# Patient Record
Sex: Male | Born: 1935
Health system: Southern US, Community
[De-identification: ages and names within clinical notes are randomized; demographics above are authoritative.]

## PROBLEM LIST (undated history)

## (undated) DIAGNOSIS — N183 Chronic kidney disease, stage 3 unspecified: Secondary | ICD-10-CM

## (undated) DIAGNOSIS — I469 Cardiac arrest, cause unspecified: Secondary | ICD-10-CM

## (undated) DIAGNOSIS — Z95810 Presence of automatic (implantable) cardiac defibrillator: Secondary | ICD-10-CM

## (undated) DIAGNOSIS — I255 Ischemic cardiomyopathy: Secondary | ICD-10-CM

## (undated) DIAGNOSIS — Z9989 Dependence on other enabling machines and devices: Secondary | ICD-10-CM

## (undated) DIAGNOSIS — N138 Other obstructive and reflux uropathy: Secondary | ICD-10-CM

## (undated) DIAGNOSIS — I219 Acute myocardial infarction, unspecified: Secondary | ICD-10-CM

## (undated) DIAGNOSIS — S838X9A Sprain of other specified parts of unspecified knee, initial encounter: Secondary | ICD-10-CM

## (undated) DIAGNOSIS — E669 Obesity, unspecified: Secondary | ICD-10-CM

## (undated) DIAGNOSIS — G4733 Obstructive sleep apnea (adult) (pediatric): Secondary | ICD-10-CM

## (undated) DIAGNOSIS — N401 Enlarged prostate with lower urinary tract symptoms: Secondary | ICD-10-CM

## (undated) DIAGNOSIS — M75100 Unspecified rotator cuff tear or rupture of unspecified shoulder, not specified as traumatic: Secondary | ICD-10-CM

## (undated) DIAGNOSIS — E7800 Pure hypercholesterolemia, unspecified: Secondary | ICD-10-CM

## (undated) DIAGNOSIS — Z860101 Personal history of adenomatous and serrated colon polyps: Secondary | ICD-10-CM

## (undated) DIAGNOSIS — N529 Male erectile dysfunction, unspecified: Secondary | ICD-10-CM

## (undated) DIAGNOSIS — I2510 Atherosclerotic heart disease of native coronary artery without angina pectoris: Secondary | ICD-10-CM

## (undated) DIAGNOSIS — D649 Anemia, unspecified: Secondary | ICD-10-CM

## (undated) DIAGNOSIS — E291 Testicular hypofunction: Secondary | ICD-10-CM

## (undated) DIAGNOSIS — M1990 Unspecified osteoarthritis, unspecified site: Secondary | ICD-10-CM

## (undated) DIAGNOSIS — I519 Heart disease, unspecified: Secondary | ICD-10-CM

## (undated) DIAGNOSIS — Z86010 Personal history of colonic polyps: Secondary | ICD-10-CM

## (undated) DIAGNOSIS — I872 Venous insufficiency (chronic) (peripheral): Secondary | ICD-10-CM

## (undated) DIAGNOSIS — M949 Disorder of cartilage, unspecified: Secondary | ICD-10-CM

## (undated) DIAGNOSIS — E119 Type 2 diabetes mellitus without complications: Secondary | ICD-10-CM

## (undated) DIAGNOSIS — M899 Disorder of bone, unspecified: Secondary | ICD-10-CM

## (undated) DIAGNOSIS — N390 Urinary tract infection, site not specified: Secondary | ICD-10-CM

## (undated) DIAGNOSIS — I10 Essential (primary) hypertension: Secondary | ICD-10-CM

## (undated) DIAGNOSIS — K219 Gastro-esophageal reflux disease without esophagitis: Secondary | ICD-10-CM

## (undated) HISTORY — PX: OTHER SURGICAL HISTORY: SHX169

## (undated) HISTORY — DX: Atherosclerotic heart disease of native coronary artery without angina pectoris: I25.10

## (undated) HISTORY — DX: Obesity, unspecified: E66.9

## (undated) HISTORY — DX: Heart disease, unspecified: I51.9

## (undated) HISTORY — DX: Venous insufficiency (chronic) (peripheral): I87.2

## (undated) HISTORY — DX: Personal history of adenomatous and serrated colon polyps: Z86.0101

## (undated) HISTORY — DX: Ischemic cardiomyopathy: I25.5

## (undated) HISTORY — DX: Pure hypercholesterolemia, unspecified: E78.00

## (undated) HISTORY — DX: Essential (primary) hypertension: I10

## (undated) HISTORY — DX: Male erectile dysfunction, unspecified: N52.9

## (undated) HISTORY — DX: Testicular hypofunction: E29.1

## (undated) HISTORY — PX: POLYPECTOMY: SHX149

## (undated) HISTORY — PX: CARDIAC DEFIBRILLATOR PLACEMENT: SHX171

## (undated) HISTORY — DX: Dependence on other enabling machines and devices: Z99.89

## (undated) HISTORY — DX: Chronic kidney disease, stage 3 unspecified: N18.30

## (undated) HISTORY — DX: Other obstructive and reflux uropathy: N40.1

## (undated) HISTORY — DX: Disorder of bone, unspecified: M89.9

## (undated) HISTORY — DX: Unspecified rotator cuff tear or rupture of unspecified shoulder, not specified as traumatic: M75.100

## (undated) HISTORY — DX: Unspecified osteoarthritis, unspecified site: M19.90

## (undated) HISTORY — DX: Chronic kidney disease, stage 3 (moderate): N18.3

## (undated) HISTORY — DX: Cardiac arrest, cause unspecified: I46.9

## (undated) HISTORY — DX: Personal history of colonic polyps: Z86.010

## (undated) HISTORY — DX: Sprain of other specified parts of unspecified knee, initial encounter: S83.8X9A

## (undated) HISTORY — DX: Gastro-esophageal reflux disease without esophagitis: K21.9

## (undated) HISTORY — DX: Type 2 diabetes mellitus without complications: E11.9

## (undated) HISTORY — DX: Disorder of bone, unspecified: M94.9

## (undated) HISTORY — DX: Anemia, unspecified: D64.9

## (undated) HISTORY — DX: Benign prostatic hyperplasia with lower urinary tract symptoms: N13.8

## (undated) HISTORY — DX: Obstructive sleep apnea (adult) (pediatric): G47.33

---

## 1994-02-17 HISTORY — PX: CORONARY ARTERY BYPASS GRAFT: SHX141

## 2001-02-17 HISTORY — PX: COLONOSCOPY W/ POLYPECTOMY: SHX1380

## 2004-03-06 ENCOUNTER — Ambulatory Visit: Payer: Self-pay | Admitting: Family Medicine

## 2004-03-08 ENCOUNTER — Ambulatory Visit: Payer: Self-pay | Admitting: Family Medicine

## 2004-04-23 ENCOUNTER — Ambulatory Visit: Payer: Self-pay | Admitting: Family Medicine

## 2006-01-12 ENCOUNTER — Ambulatory Visit: Payer: Self-pay | Admitting: Family Medicine

## 2006-01-28 ENCOUNTER — Ambulatory Visit: Payer: Self-pay | Admitting: Family Medicine

## 2006-01-28 LAB — CONVERTED CEMR LAB
ALT: 27 units/L (ref 0–40)
AST: 26 units/L (ref 0–37)
Chol/HDL Ratio, serum: 4.5
Cholesterol: 164 mg/dL (ref 0–200)
HDL: 36.2 mg/dL — ABNORMAL LOW (ref >39.0)
LDL Cholesterol: 112 mg/dL — ABNORMAL HIGH (ref 0–99)
PSA: 0.33 ng/mL (ref 0.10–4.00)
TSH: 3.3 microintl units/mL (ref 0.35–5.50)
Triglyceride fasting, serum: 81 mg/dL (ref 0–149)
VLDL: 16 mg/dL (ref 0–40)

## 2006-02-17 DIAGNOSIS — I469 Cardiac arrest, cause unspecified: Secondary | ICD-10-CM

## 2006-02-17 HISTORY — DX: Cardiac arrest, cause unspecified: I46.9

## 2006-05-05 ENCOUNTER — Ambulatory Visit: Payer: Self-pay | Admitting: Family Medicine

## 2006-05-05 LAB — CONVERTED CEMR LAB
ALT: 29 units/L (ref 0–40)
AST: 25 units/L (ref 0–37)
Cholesterol: 124 mg/dL (ref 0–200)
HDL: 33.7 mg/dL — ABNORMAL LOW (ref >39.0)
Hgb A1c MFr Bld: 6.5 % — ABNORMAL HIGH (ref 4.6–6.0)
LDL Cholesterol: 74 mg/dL (ref 0–99)
Total CHOL/HDL Ratio: 3.7
Triglycerides: 81 mg/dL (ref 0–149)
VLDL: 16 mg/dL (ref 0–40)

## 2006-07-19 HISTORY — PX: PACEMAKER PLACEMENT: SHX43

## 2006-07-24 ENCOUNTER — Encounter: Payer: Self-pay | Admitting: Cardiovascular Disease

## 2006-07-24 ENCOUNTER — Ambulatory Visit: Payer: Self-pay | Admitting: Cardiology

## 2006-07-24 ENCOUNTER — Inpatient Hospital Stay (HOSPITAL_COMMUNITY): Admission: EM | Admit: 2006-07-24 | Discharge: 2006-08-14 | Payer: Self-pay | Admitting: Emergency Medicine

## 2006-07-24 ENCOUNTER — Ambulatory Visit: Payer: Self-pay | Admitting: Pulmonary Disease

## 2006-07-24 ENCOUNTER — Ambulatory Visit: Payer: Self-pay | Admitting: Cardiovascular Disease

## 2006-08-16 ENCOUNTER — Ambulatory Visit: Payer: Self-pay | Admitting: Cardiovascular Disease

## 2006-08-24 ENCOUNTER — Ambulatory Visit: Payer: Self-pay | Admitting: Cardiovascular Disease

## 2006-08-31 ENCOUNTER — Ambulatory Visit: Payer: Self-pay

## 2006-09-07 ENCOUNTER — Ambulatory Visit: Payer: Self-pay | Admitting: Cardiovascular Disease

## 2006-09-07 LAB — CONVERTED CEMR LAB
BUN: 18 mg/dL (ref 6–23)
CO2: 28 meq/L (ref 19–32)
Calcium: 9 mg/dL (ref 8.4–10.5)
Chloride: 109 meq/L (ref 96–112)
Creatinine, Ser: 1.6 mg/dL — ABNORMAL HIGH (ref 0.4–1.5)
GFR calc Af Amer: 56 mL/min
GFR calc non Af Amer: 46 mL/min
Glucose, Bld: 144 mg/dL — ABNORMAL HIGH (ref 70–99)
Potassium: 5.1 meq/L (ref 3.5–5.1)
Sodium: 143 meq/L (ref 135–145)

## 2006-09-17 ENCOUNTER — Ambulatory Visit: Payer: Self-pay

## 2006-09-18 ENCOUNTER — Ambulatory Visit: Payer: Self-pay | Admitting: Pulmonary Disease

## 2006-10-01 ENCOUNTER — Encounter (HOSPITAL_COMMUNITY): Admission: RE | Admit: 2006-10-01 | Discharge: 2006-12-30 | Payer: Self-pay | Admitting: Cardiovascular Disease

## 2006-10-07 ENCOUNTER — Telehealth (INDEPENDENT_AMBULATORY_CARE_PROVIDER_SITE_OTHER): Payer: Self-pay | Admitting: *Deleted

## 2006-10-12 ENCOUNTER — Ambulatory Visit: Payer: Self-pay | Admitting: Pulmonary Disease

## 2006-10-12 ENCOUNTER — Ambulatory Visit (HOSPITAL_BASED_OUTPATIENT_CLINIC_OR_DEPARTMENT_OTHER): Admission: RE | Admit: 2006-10-12 | Discharge: 2006-10-12 | Payer: Self-pay | Admitting: Pulmonary Disease

## 2006-10-16 ENCOUNTER — Ambulatory Visit: Payer: Self-pay | Admitting: Cardiovascular Disease

## 2006-10-16 LAB — CONVERTED CEMR LAB
ALT: 16 units/L (ref 0–53)
AST: 16 units/L (ref 0–37)
Albumin: 3.5 g/dL (ref 3.5–5.2)
Alkaline Phosphatase: 34 units/L — ABNORMAL LOW (ref 39–117)
BUN: 19 mg/dL (ref 6–23)
Bilirubin, Direct: 0.2 mg/dL (ref 0.0–0.3)
CO2: 25 meq/L (ref 19–32)
Calcium: 9.4 mg/dL (ref 8.4–10.5)
Chloride: 107 meq/L (ref 96–112)
Cholesterol: 99 mg/dL (ref 0–200)
Creatinine, Ser: 1.4 mg/dL (ref 0.4–1.5)
GFR calc Af Amer: 65 mL/min
GFR calc non Af Amer: 54 mL/min
Glucose, Bld: 102 mg/dL — ABNORMAL HIGH (ref 70–99)
HDL: 25.8 mg/dL — ABNORMAL LOW (ref >39.0)
LDL Cholesterol: 56 mg/dL (ref 0–99)
Potassium: 4.4 meq/L (ref 3.5–5.1)
Sodium: 139 meq/L (ref 135–145)
Total Bilirubin: 0.8 mg/dL (ref 0.3–1.2)
Total CHOL/HDL Ratio: 3.8
Total Protein: 6.8 g/dL (ref 6.0–8.3)
Triglycerides: 86 mg/dL (ref 0–149)
VLDL: 17 mg/dL (ref 0–40)

## 2006-10-21 ENCOUNTER — Ambulatory Visit: Payer: Self-pay | Admitting: Cardiovascular Disease

## 2006-10-22 ENCOUNTER — Encounter: Admission: RE | Admit: 2006-10-22 | Discharge: 2006-10-22 | Payer: Self-pay | Admitting: Neurosurgery

## 2006-10-23 ENCOUNTER — Encounter: Payer: Self-pay | Admitting: Family Medicine

## 2006-10-27 ENCOUNTER — Ambulatory Visit: Payer: Self-pay | Admitting: Pulmonary Disease

## 2006-11-09 ENCOUNTER — Ambulatory Visit (HOSPITAL_BASED_OUTPATIENT_CLINIC_OR_DEPARTMENT_OTHER): Admission: RE | Admit: 2006-11-09 | Discharge: 2006-11-09 | Payer: Self-pay | Admitting: Pulmonary Disease

## 2006-11-09 ENCOUNTER — Ambulatory Visit: Payer: Self-pay | Admitting: Pulmonary Disease

## 2006-11-16 ENCOUNTER — Encounter: Payer: Self-pay | Admitting: Family Medicine

## 2006-12-02 ENCOUNTER — Ambulatory Visit: Payer: Self-pay | Admitting: Internal Medicine

## 2006-12-04 ENCOUNTER — Ambulatory Visit: Payer: Self-pay | Admitting: Family Medicine

## 2006-12-31 ENCOUNTER — Encounter (HOSPITAL_COMMUNITY): Admission: RE | Admit: 2006-12-31 | Discharge: 2007-03-01 | Payer: Self-pay | Admitting: Cardiovascular Disease

## 2007-01-12 ENCOUNTER — Ambulatory Visit: Payer: Self-pay | Admitting: Pulmonary Disease

## 2007-01-21 ENCOUNTER — Ambulatory Visit: Payer: Self-pay | Admitting: Cardiovascular Disease

## 2007-01-21 ENCOUNTER — Encounter: Payer: Self-pay | Admitting: Family Medicine

## 2007-03-11 ENCOUNTER — Ambulatory Visit: Payer: Self-pay

## 2007-06-17 ENCOUNTER — Ambulatory Visit: Payer: Self-pay | Admitting: Internal Medicine

## 2007-10-29 ENCOUNTER — Ambulatory Visit: Payer: Self-pay | Admitting: Internal Medicine

## 2007-12-07 ENCOUNTER — Ambulatory Visit: Payer: Self-pay | Admitting: Family Medicine

## 2007-12-07 DIAGNOSIS — E785 Hyperlipidemia, unspecified: Secondary | ICD-10-CM | POA: Insufficient documentation

## 2007-12-07 DIAGNOSIS — E039 Hypothyroidism, unspecified: Secondary | ICD-10-CM | POA: Insufficient documentation

## 2007-12-07 DIAGNOSIS — M949 Disorder of cartilage, unspecified: Secondary | ICD-10-CM

## 2007-12-07 DIAGNOSIS — M899 Disorder of bone, unspecified: Secondary | ICD-10-CM | POA: Insufficient documentation

## 2007-12-07 LAB — CONVERTED CEMR LAB
Bilirubin Urine: NEGATIVE
Blood in Urine, dipstick: NEGATIVE
Glucose, Urine, Semiquant: NEGATIVE
Ketones, urine, test strip: NEGATIVE
Nitrite: NEGATIVE
Protein, U semiquant: NEGATIVE
Specific Gravity, Urine: 1.02
Urobilinogen, UA: 0.2
WBC Urine, dipstick: NEGATIVE
pH: 5

## 2007-12-09 LAB — CONVERTED CEMR LAB
ALT: 31 units/L (ref 0–53)
AST: 26 units/L (ref 0–37)
Albumin: 4.4 g/dL (ref 3.5–5.2)
Alkaline Phosphatase: 34 units/L — ABNORMAL LOW (ref 39–117)
BUN: 18 mg/dL (ref 6–23)
Basophils Absolute: 0 10*3/uL (ref 0.0–0.1)
Basophils Relative: 0.4 % (ref 0.0–3.0)
Bilirubin, Direct: 0.1 mg/dL (ref 0.0–0.3)
CO2: 31 meq/L (ref 19–32)
Calcium: 9.4 mg/dL (ref 8.4–10.5)
Chloride: 105 meq/L (ref 96–112)
Cholesterol: 140 mg/dL (ref 0–200)
Creatinine, Ser: 1.4 mg/dL (ref 0.4–1.5)
Eosinophils Absolute: 0.1 10*3/uL (ref 0.0–0.7)
Eosinophils Relative: 1.3 % (ref 0.0–5.0)
GFR calc Af Amer: 65 mL/min
GFR calc non Af Amer: 53 mL/min
Glucose, Bld: 114 mg/dL — ABNORMAL HIGH (ref 70–99)
HCT: 41.7 % (ref 39.0–52.0)
HDL: 35.6 mg/dL — ABNORMAL LOW (ref >39.0)
Hemoglobin: 14.7 g/dL (ref 13.0–17.0)
LDL Cholesterol: 91 mg/dL (ref 0–99)
Lymphocytes Relative: 34.1 % (ref 12.0–46.0)
MCHC: 35.2 g/dL (ref 30.0–36.0)
MCV: 91.7 fL (ref 78.0–100.0)
Monocytes Absolute: 0.6 10*3/uL (ref 0.1–1.0)
Monocytes Relative: 9.8 % (ref 3.0–12.0)
Neutro Abs: 3.3 10*3/uL (ref 1.4–7.7)
Neutrophils Relative %: 54.4 % (ref 43.0–77.0)
PSA: 0.28 ng/mL (ref 0.10–4.00)
Platelets: 186 10*3/uL (ref 150–400)
Potassium: 4.4 meq/L (ref 3.5–5.1)
RBC: 4.54 M/uL (ref 4.22–5.81)
RDW: 12.9 % (ref 11.5–14.6)
Sodium: 145 meq/L (ref 135–145)
TSH: 3.61 microintl units/mL (ref 0.35–5.50)
Total Bilirubin: 0.9 mg/dL (ref 0.3–1.2)
Total CHOL/HDL Ratio: 3.9
Total Protein: 7.5 g/dL (ref 6.0–8.3)
Triglycerides: 65 mg/dL (ref 0–149)
VLDL: 13 mg/dL (ref 0–40)
Vit D, 1,25-Dihydroxy: 41 (ref 30–89)
WBC: 6.1 10*3/uL (ref 4.5–10.5)

## 2008-01-07 ENCOUNTER — Ambulatory Visit: Payer: Self-pay | Admitting: Family Medicine

## 2008-01-07 LAB — CONVERTED CEMR LAB
OCCULT 1: NEGATIVE
OCCULT 2: NEGATIVE
OCCULT 3: NEGATIVE

## 2008-01-17 ENCOUNTER — Encounter: Payer: Self-pay | Admitting: Family Medicine

## 2008-01-31 ENCOUNTER — Ambulatory Visit: Payer: Self-pay | Admitting: Internal Medicine

## 2008-04-06 ENCOUNTER — Encounter: Payer: Self-pay | Admitting: Internal Medicine

## 2008-04-13 ENCOUNTER — Telehealth: Payer: Self-pay | Admitting: Family Medicine

## 2008-06-15 IMAGING — CR DG CHEST 1V PORT
1 series · 1 of 1 positions shown · non-contrast
Comparison: none

CLINICAL DATA: 70-year-old with cardiac arrest and pulmonary distress. 
 PORTABLE CHEST - 1 VIEW:

[view not recorded]
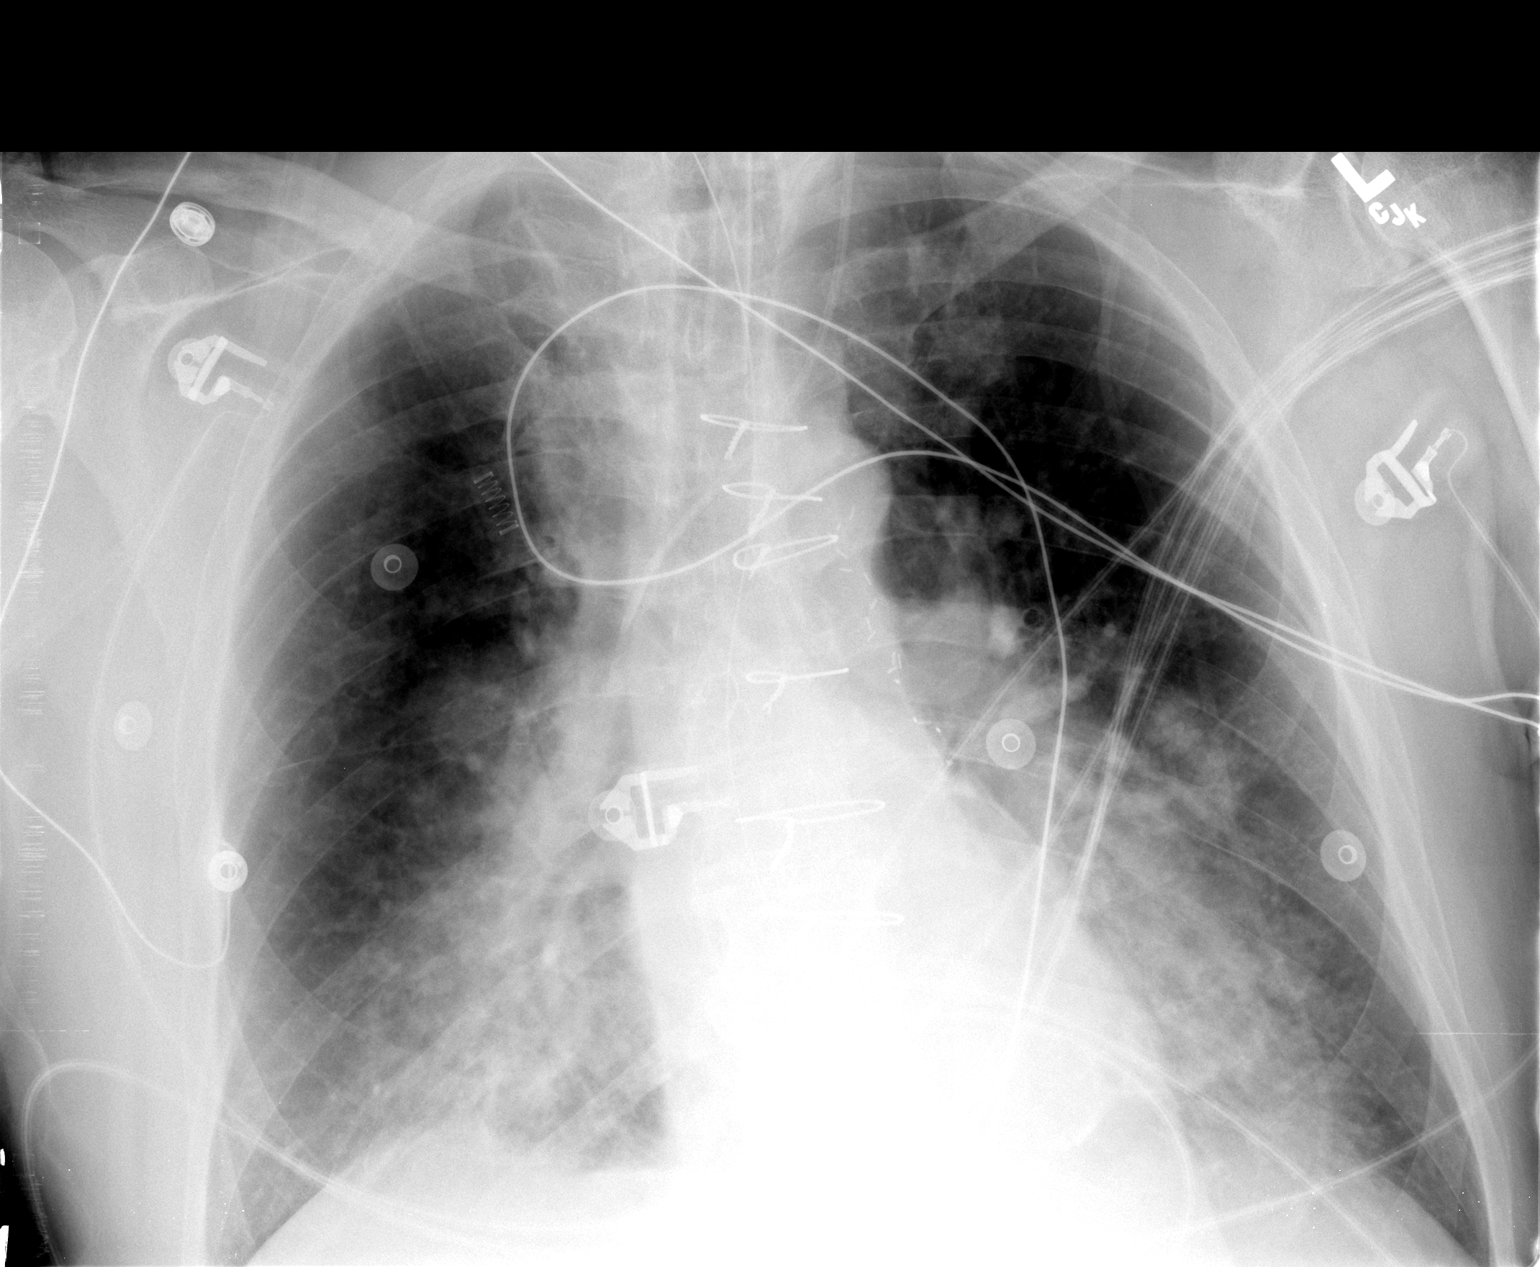

[1 of 1 positions shown; findings below may reference images not displayed]

FINDINGS: The support apparatus is stable. There is persistent and slightly progressive perihilar airspace process, which is likely infiltrate/aspiration. Edema is possible, but I think it is less likely given the normal heart size and no pleural effusions.
IMPRESSION: Persistent and perhaps slightly progressive bilateral infiltrates.

## 2008-06-15 IMAGING — CR DG CHEST 1V PORT
1 series · 1 of 1 positions shown · non-contrast
Comparison: 07/24/06.

CLINICAL DATA: 70-year-old with cardiac arrest.  NG tube placement.
PORTABLE CHEST ? 1 VIEW:

[view not recorded]
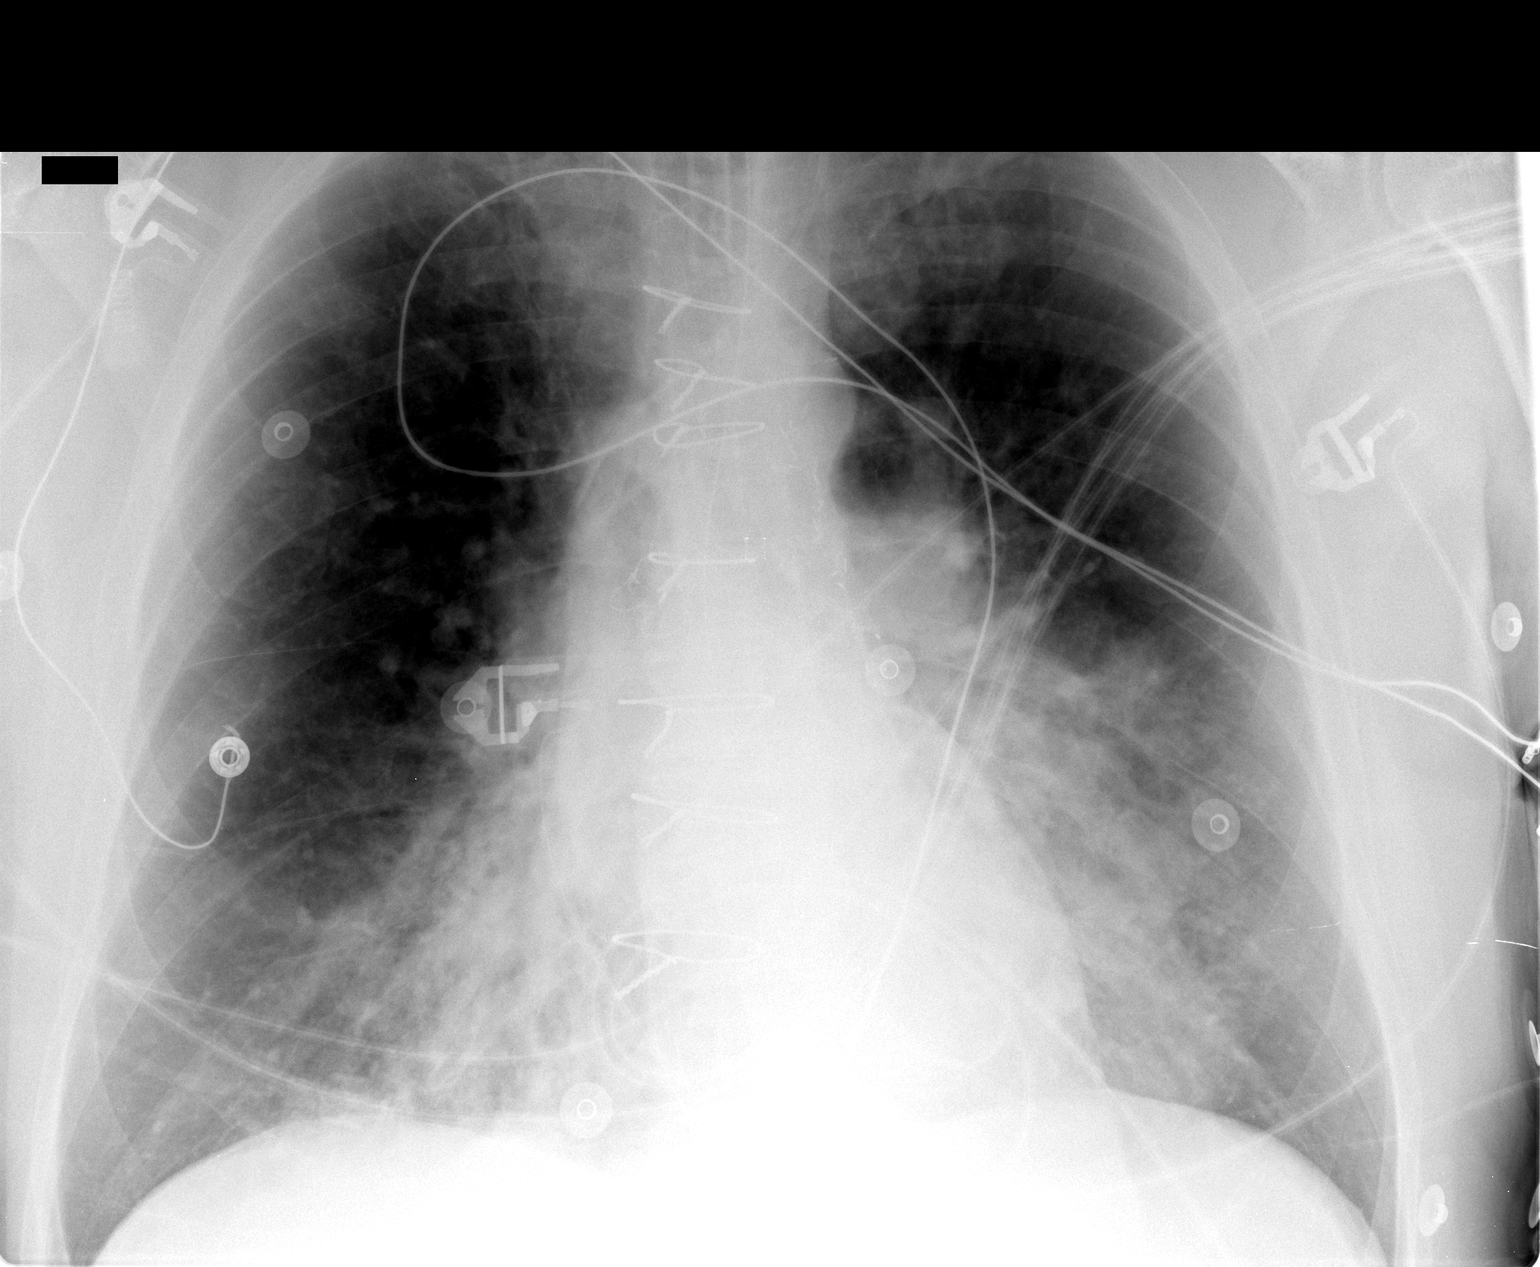

[1 of 1 positions shown; findings below may reference images not displayed]

FINDINGS: Endotracheal tube is in stable position.  Left IJ catheter is stable.  NG tube is coursing down the esophagus, but the distal end is not seen.  Persistent perihilar infiltrates or possibly perihilar edema is noted.  No effusions are seen.
IMPRESSION: 1. Stable ET tube and left IJ catheter.  
2. NG tube is coursing down the esophagus.  Distal end is not seen.
3. Persistent perihilar process, infiltrates versus edema.

## 2008-06-17 IMAGING — CR DG CHEST 1V PORT
1 series · 1 of 1 positions shown · non-contrast
Comparison: 07/26/06.

CLINICAL DATA: Respiratory distress.
 PORTABLE CHEST - 1 VIEW - 07/27/06:

[view not recorded]
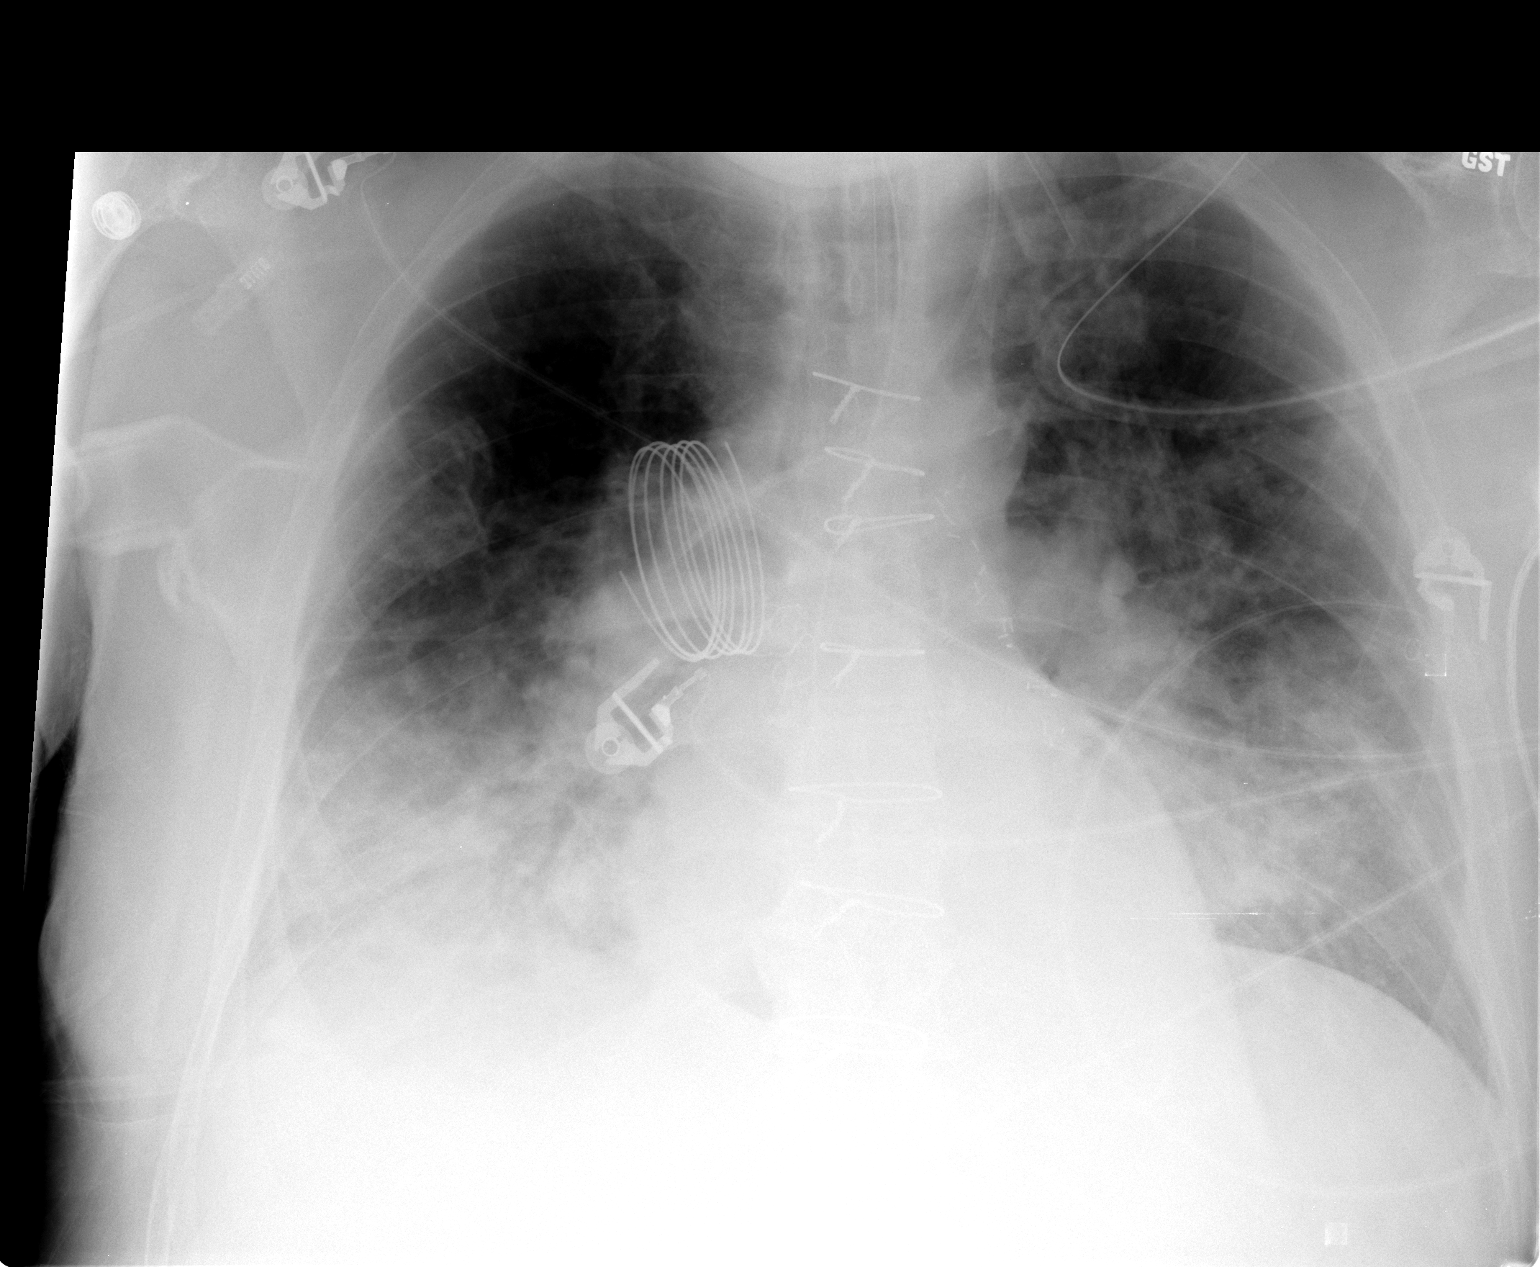

[1 of 1 positions shown; findings below may reference images not displayed]

FINDINGS: The endotracheal tube and central catheter are in satisfactory position.  Bilateral pulmonary infiltrates with progressive right lower lobe atelectasis.  The heart remains upper normal.
IMPRESSION: Pulmonary infiltrates without change with progressive atelectasis.

## 2008-06-19 IMAGING — CR DG CHEST 1V PORT
1 series · 1 of 1 positions shown · non-contrast
Comparison: 07/28/06.

CLINICAL DATA: Cardiac arrest.  Altered consciousness.  Aspiration. Abnormal ECG.  
PORTABLE CHEST - 1 VIEW, 07/29/06, 6896 HOURS:

[view not recorded]
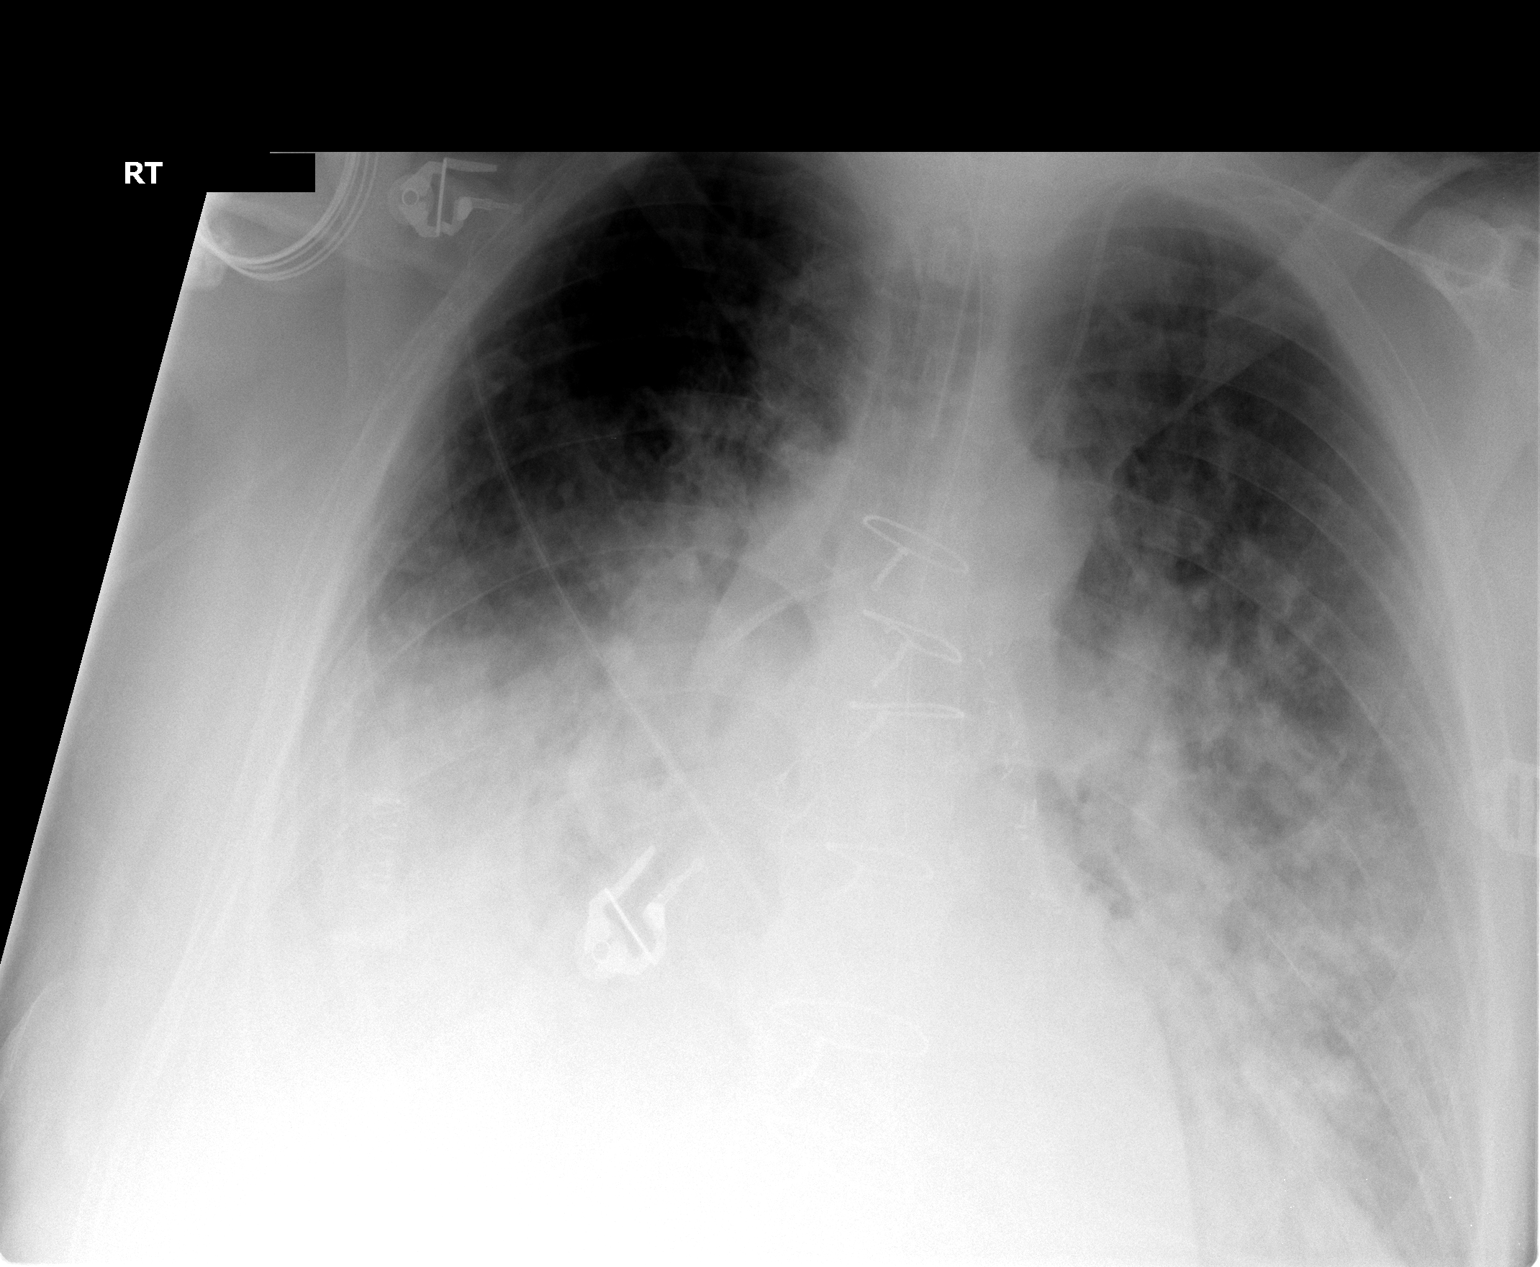

[1 of 1 positions shown; findings below may reference images not displayed]

FINDINGS: Vascular congestion and bilateral air space disease compatible with pneumonia and/or edema are again appreciated.  Bilateral lower lobar atelectasis/consolidations.  Satisfactory endotracheal tube position.  Left jugular CVC is in the SVC.  NG tube is noted.
IMPRESSION: No significant change in CHF and bilateral edema/pneumonia and atelectasis.

## 2008-06-21 IMAGING — CR DG CHEST 1V PORT
1 series · 1 of 1 positions shown · non-contrast
Comparison: [DATE].

CLINICAL DATA: Cardiac arrest.  Abnormal EKG.  Altered consciousness. 
 PORTABLE CHEST - 1 VIEW, 07/31/06, 1111 HOURS:

[view not recorded]
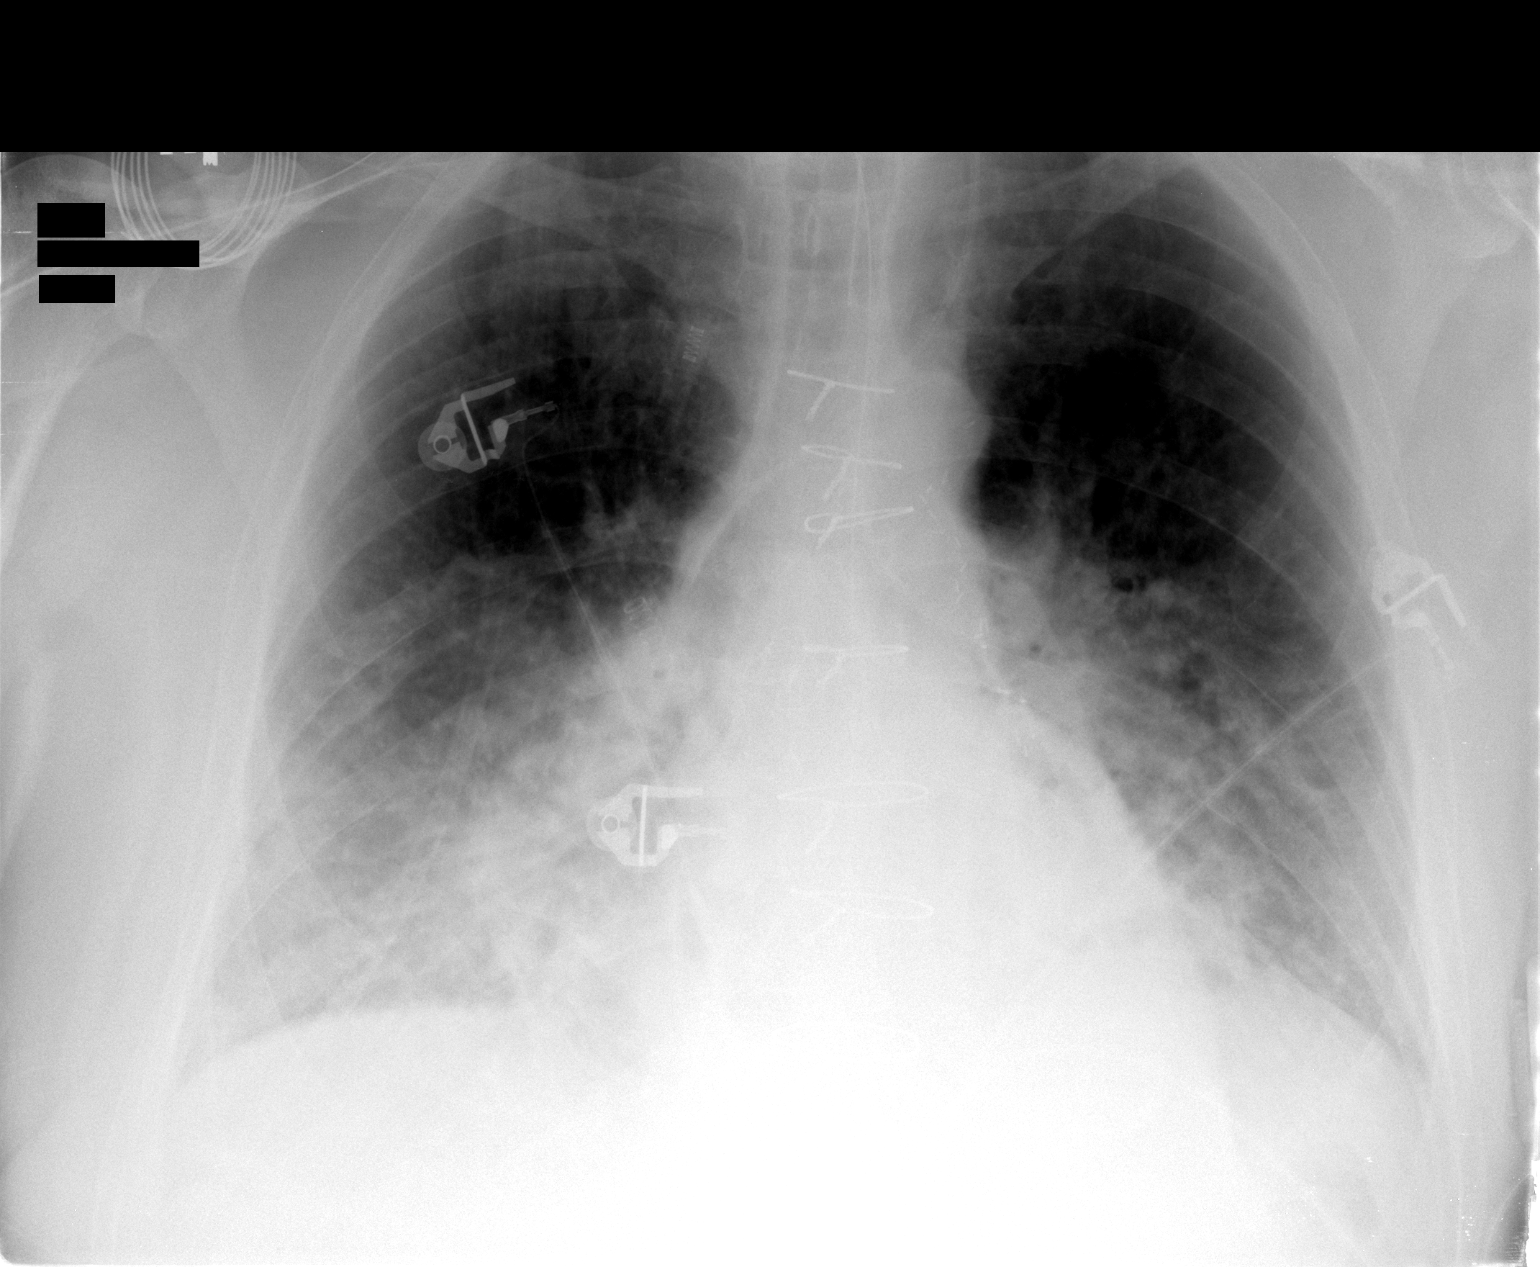

[1 of 1 positions shown; findings below may reference images not displayed]

FINDINGS: Aeration has improved.  There is persistent edema with probable effusions present.  Endotracheal tube and left central venous line remain.
IMPRESSION: Better aeration with some improvement in edema.

## 2008-07-04 ENCOUNTER — Telehealth: Payer: Self-pay | Admitting: Family Medicine

## 2008-07-11 ENCOUNTER — Ambulatory Visit: Payer: Self-pay | Admitting: Internal Medicine

## 2008-07-11 DIAGNOSIS — I469 Cardiac arrest, cause unspecified: Secondary | ICD-10-CM | POA: Insufficient documentation

## 2008-10-09 ENCOUNTER — Ambulatory Visit: Payer: Self-pay | Admitting: Internal Medicine

## 2008-10-26 ENCOUNTER — Encounter: Payer: Self-pay | Admitting: Internal Medicine

## 2008-11-07 ENCOUNTER — Ambulatory Visit: Payer: Self-pay | Admitting: Family Medicine

## 2008-12-20 ENCOUNTER — Ambulatory Visit: Payer: Self-pay | Admitting: Family Medicine

## 2009-01-08 ENCOUNTER — Ambulatory Visit: Payer: Self-pay | Admitting: Internal Medicine

## 2009-01-10 ENCOUNTER — Encounter: Payer: Self-pay | Admitting: Internal Medicine

## 2009-01-18 ENCOUNTER — Ambulatory Visit: Payer: Self-pay | Admitting: Family Medicine

## 2009-01-18 DIAGNOSIS — K219 Gastro-esophageal reflux disease without esophagitis: Secondary | ICD-10-CM | POA: Insufficient documentation

## 2009-01-18 LAB — CONVERTED CEMR LAB
Bilirubin Urine: NEGATIVE
Blood in Urine, dipstick: NEGATIVE
Glucose, Urine, Semiquant: NEGATIVE
Ketones, urine, test strip: NEGATIVE
Nitrite: NEGATIVE
Protein, U semiquant: NEGATIVE
Specific Gravity, Urine: 1.015
Urobilinogen, UA: 0.2
WBC Urine, dipstick: NEGATIVE
pH: 5

## 2009-01-23 ENCOUNTER — Encounter (INDEPENDENT_AMBULATORY_CARE_PROVIDER_SITE_OTHER): Payer: Self-pay | Admitting: *Deleted

## 2009-01-24 ENCOUNTER — Ambulatory Visit: Payer: Self-pay | Admitting: Family Medicine

## 2009-01-24 LAB — CONVERTED CEMR LAB
ALT: 23 units/L (ref 0–53)
AST: 20 units/L (ref 0–37)
Albumin: 3.9 g/dL (ref 3.5–5.2)
Alkaline Phosphatase: 46 units/L (ref 39–117)
BUN: 16 mg/dL (ref 6–23)
Basophils Absolute: 0.1 10*3/uL (ref 0.0–0.1)
Basophils Relative: 0.7 % (ref 0.0–3.0)
Bilirubin, Direct: 0 mg/dL (ref 0.0–0.3)
CO2: 30 meq/L (ref 19–32)
Calcium: 9.1 mg/dL (ref 8.4–10.5)
Chloride: 104 meq/L (ref 96–112)
Cholesterol: 147 mg/dL (ref 0–200)
Creatinine, Ser: 1.5 mg/dL (ref 0.4–1.5)
Eosinophils Absolute: 0.1 10*3/uL (ref 0.0–0.7)
Eosinophils Relative: 1.8 % (ref 0.0–5.0)
GFR calc non Af Amer: 49.03 mL/min (ref >60)
Glucose, Bld: 112 mg/dL — ABNORMAL HIGH (ref 70–99)
HCT: 43 % (ref 39.0–52.0)
HDL: 32.4 mg/dL — ABNORMAL LOW (ref >39.00)
Hemoglobin: 14.2 g/dL (ref 13.0–17.0)
LDL Cholesterol: 96 mg/dL (ref 0–99)
Lymphocytes Relative: 24.5 % (ref 12.0–46.0)
Lymphs Abs: 2 10*3/uL (ref 0.7–4.0)
MCHC: 33 g/dL (ref 30.0–36.0)
MCV: 95 fL (ref 78.0–100.0)
Monocytes Absolute: 1 10*3/uL (ref 0.1–1.0)
Monocytes Relative: 12.4 % — ABNORMAL HIGH (ref 3.0–12.0)
Neutro Abs: 5.1 10*3/uL (ref 1.4–7.7)
Neutrophils Relative %: 60.6 % (ref 43.0–77.0)
OCCULT 1: NEGATIVE
OCCULT 2: NEGATIVE
OCCULT 3: NEGATIVE
PSA: 0.31 ng/mL (ref 0.10–4.00)
Platelets: 182 10*3/uL (ref 150.0–400.0)
Potassium: 4.8 meq/L (ref 3.5–5.1)
RBC: 4.53 M/uL (ref 4.22–5.81)
RDW: 12.6 % (ref 11.5–14.6)
Sodium: 143 meq/L (ref 135–145)
TSH: 6.02 microintl units/mL — ABNORMAL HIGH (ref 0.35–5.50)
Total Bilirubin: 0.7 mg/dL (ref 0.3–1.2)
Total CHOL/HDL Ratio: 5
Total Protein: 7.4 g/dL (ref 6.0–8.3)
Triglycerides: 91 mg/dL (ref 0.0–149.0)
VLDL: 18.2 mg/dL (ref 0.0–40.0)
Vit D, 25-Hydroxy: 58 ng/mL (ref 30–89)
WBC: 8.3 10*3/uL (ref 4.5–10.5)

## 2009-01-31 ENCOUNTER — Encounter: Admission: RE | Admit: 2009-01-31 | Discharge: 2009-01-31 | Payer: Self-pay | Admitting: Orthopedic Surgery

## 2009-02-08 ENCOUNTER — Encounter: Payer: Self-pay | Admitting: Family Medicine

## 2009-04-13 ENCOUNTER — Encounter: Payer: Self-pay | Admitting: Internal Medicine

## 2009-04-16 ENCOUNTER — Encounter: Payer: Self-pay | Admitting: Internal Medicine

## 2009-04-17 ENCOUNTER — Ambulatory Visit: Payer: Self-pay | Admitting: Internal Medicine

## 2009-05-01 ENCOUNTER — Encounter: Payer: Self-pay | Admitting: Internal Medicine

## 2009-06-19 ENCOUNTER — Ambulatory Visit: Payer: Self-pay | Admitting: Internal Medicine

## 2009-09-19 ENCOUNTER — Encounter: Payer: Self-pay | Admitting: Internal Medicine

## 2009-09-20 ENCOUNTER — Ambulatory Visit: Payer: Self-pay | Admitting: Internal Medicine

## 2009-10-05 ENCOUNTER — Encounter: Payer: Self-pay | Admitting: Internal Medicine

## 2009-10-12 ENCOUNTER — Encounter (INDEPENDENT_AMBULATORY_CARE_PROVIDER_SITE_OTHER): Payer: Self-pay | Admitting: *Deleted

## 2009-12-20 ENCOUNTER — Ambulatory Visit: Payer: Self-pay | Admitting: Internal Medicine

## 2009-12-31 ENCOUNTER — Encounter: Payer: Self-pay | Admitting: Internal Medicine

## 2010-01-25 ENCOUNTER — Telehealth: Payer: Self-pay | Admitting: Family Medicine

## 2010-03-11 ENCOUNTER — Encounter: Payer: Self-pay | Admitting: Neurosurgery

## 2010-03-19 ENCOUNTER — Other Ambulatory Visit: Payer: Self-pay | Admitting: Family Medicine

## 2010-03-19 ENCOUNTER — Ambulatory Visit
Admission: RE | Admit: 2010-03-19 | Discharge: 2010-03-19 | Payer: Self-pay | Source: Home / Self Care | Attending: Family Medicine | Admitting: Family Medicine

## 2010-03-19 DIAGNOSIS — D649 Anemia, unspecified: Secondary | ICD-10-CM | POA: Insufficient documentation

## 2010-03-19 DIAGNOSIS — E559 Vitamin D deficiency, unspecified: Secondary | ICD-10-CM | POA: Insufficient documentation

## 2010-03-19 DIAGNOSIS — R350 Frequency of micturition: Secondary | ICD-10-CM | POA: Insufficient documentation

## 2010-03-19 DIAGNOSIS — F528 Other sexual dysfunction not due to a substance or known physiological condition: Secondary | ICD-10-CM | POA: Insufficient documentation

## 2010-03-19 LAB — BASIC METABOLIC PANEL
BUN: 18 mg/dL (ref 6–23)
CO2: 29 mEq/L (ref 19–32)
Calcium: 9.1 mg/dL (ref 8.4–10.5)
Chloride: 105 mEq/L (ref 96–112)
Creatinine, Ser: 1.4 mg/dL (ref 0.4–1.5)
GFR: 52.49 mL/min — ABNORMAL LOW (ref >60.00)
Glucose, Bld: 112 mg/dL — ABNORMAL HIGH (ref 70–99)
Potassium: 4.6 mEq/L (ref 3.5–5.1)
Sodium: 141 mEq/L (ref 135–145)

## 2010-03-19 LAB — CONVERTED CEMR LAB
Bilirubin Urine: NEGATIVE
Blood in Urine, dipstick: NEGATIVE
Glucose, Urine, Semiquant: NEGATIVE
Ketones, urine, test strip: NEGATIVE
Nitrite: NEGATIVE
Protein, U semiquant: NEGATIVE
Specific Gravity, Urine: 1.02
Urobilinogen, UA: 0.2
WBC Urine, dipstick: NEGATIVE
pH: 5

## 2010-03-19 LAB — HEPATIC FUNCTION PANEL
ALT: 31 U/L (ref 0–53)
AST: 29 U/L (ref 0–37)
Albumin: 4.3 g/dL (ref 3.5–5.2)
Alkaline Phosphatase: 44 U/L (ref 39–117)
Bilirubin, Direct: 0.1 mg/dL (ref 0.0–0.3)
Total Bilirubin: 0.5 mg/dL (ref 0.3–1.2)
Total Protein: 7.3 g/dL (ref 6.0–8.3)

## 2010-03-19 LAB — LIPID PANEL
Cholesterol: 147 mg/dL (ref 0–200)
HDL: 36.2 mg/dL — ABNORMAL LOW (ref >39.00)
LDL Cholesterol: 95 mg/dL (ref 0–99)
Total CHOL/HDL Ratio: 4
Triglycerides: 79 mg/dL (ref 0.0–149.0)
VLDL: 15.8 mg/dL (ref 0.0–40.0)

## 2010-03-19 LAB — CBC WITH DIFFERENTIAL/PLATELET
Basophils Absolute: 0 10*3/uL (ref 0.0–0.1)
Basophils Relative: 0.6 % (ref 0.0–3.0)
Eosinophils Absolute: 0.1 10*3/uL (ref 0.0–0.7)
Eosinophils Relative: 1.5 % (ref 0.0–5.0)
HCT: 43.1 % (ref 39.0–52.0)
Hemoglobin: 14.5 g/dL (ref 13.0–17.0)
Lymphocytes Relative: 35.9 % (ref 12.0–46.0)
Lymphs Abs: 2.1 10*3/uL (ref 0.7–4.0)
MCHC: 33.6 g/dL (ref 30.0–36.0)
MCV: 93 fl (ref 78.0–100.0)
Monocytes Absolute: 0.6 10*3/uL (ref 0.1–1.0)
Monocytes Relative: 10.3 % (ref 3.0–12.0)
Neutro Abs: 3 10*3/uL (ref 1.4–7.7)
Neutrophils Relative %: 51.7 % (ref 43.0–77.0)
Platelets: 177 10*3/uL (ref 150.0–400.0)
RBC: 4.64 Mil/uL (ref 4.22–5.81)
RDW: 13.8 % (ref 11.5–14.6)
WBC: 5.8 10*3/uL (ref 4.5–10.5)

## 2010-03-19 LAB — PSA: PSA: 0.34 ng/mL (ref 0.10–4.00)

## 2010-03-19 LAB — TSH: TSH: 4.2 u[IU]/mL (ref 0.35–5.50)

## 2010-03-19 NOTE — Letter (Signed)
Summary: GOC note  GOC note   Imported By: Kassie Mends 11/10/2006 14:04:39  _____________________________________________________________________  External Attachment:    Type:   Image     Comment:   GOC note

## 2010-03-19 NOTE — Letter (Signed)
Summary: Remote Device Check  [facility], Main Office  [address]   [place], [place] [address]   Phone: [phone]  Fax: [phone]     October 26, 2008 MRN: [id number]   PAULA ZIETZ [address] [place], [place]  [address]   Dear Mr. Lueras,   Your remote transmission was recieved and reviewed by your physician.  All diagnostics were within normal limits for you.  __X___Your next transmission is scheduled for:   January 08, 2009.  Please transmit at any time this day.  If you have a wireless device your transmission will be sent automatically.     Sincerely,  Proofreader

## 2010-03-19 NOTE — Assessment & Plan Note (Signed)
Summary: [name] will come in fasting/njr/wife rescd/ccm   Vital Signs:  Patient Profile:   75 Years Old Male Weight:      232 pounds Temp:     97.6 degrees F Pulse rate:   80 / minute BP sitting:   136 / 80  (left arm)  Vitals Entered By: Pura Spice, RN (December 07, 2007 9:38 AM)                 Chief Complaint:  yrly ck up .sees Dr Excell Seltzer in Nov.  History of Present Illness: [name] in for yearly evaluationHAS BEEN DOING FINE Has shoulder pain and receiving injections from Dr Butler Denmark which are helping Since has gained 12 lbs, discussed weight reduction diet and increase exercise program Decided to change his vitamins to  the new Isotonic VitaminsReceived Zostavax last year in a test program Continues to work and doing well Seeing Dr. Graciela Husbands regarding pacemaker-defibrillator, no problems no other complaints    Current Allergies (reviewed today): No known allergies      Review of Systems      See HPI  General      Denies chills, fatigue, fever, loss of appetite, malaise, sleep disorder, sweats, weakness, and weight loss.  ENT      Denies decreased hearing, difficulty swallowing, ear discharge, earache, hoarseness, nasal congestion, nosebleeds, postnasal drainage, ringing in ears, sinus pressure, and sore throat.  CV      no problems, sees cardiologist on regular visits  Resp      Denies chest discomfort, chest pain with inspiration, cough, coughing up blood, excessive snoring, hypersomnolence, morning headaches, pleuritic, shortness of breath, sputum productive, and wheezing.  GI      Denies abdominal pain, bloody stools, change in bowel habits, constipation, dark tarry stools, diarrhea, excessive appetite, gas, hemorrhoids, indigestion, loss of appetite, nausea, vomiting, vomiting blood, and yellowish skin color.      good controll with protonix generic  GU      Denies decreased libido, discharge, dysuria, erectile dysfunction, genital sores, hematuria,  incontinence, nocturia, urinary frequency, and urinary hesitancy.  MS      See HPI  Derm      Denies changes in color of skin, changes in nail beds, dryness, excessive perspiration, flushing, hair loss, insect bite(s), itching, lesion(s), poor wound healing, and rash.  Neuro      Denies brief paralysis, difficulty with concentration, disturbances in coordination, falling down, headaches, inability to speak, memory loss, numbness, poor balance, seizures, sensation of room spinning, tingling, tremors, visual disturbances, and weakness.  Psych      Denies alternate hallucination ( auditory/visual), anxiety, depression, easily angered, easily tearful, irritability, mental problems, panic attacks, sense of great danger, suicidal thoughts/plans, thoughts of violence, unusual visions or sounds, and thoughts /plans of harming others.  Endo      Denies cold intolerance, excessive hunger, excessive thirst, excessive urination, heat intolerance, polyuria, and weight change.  Heme      Denies abnormal bruising, bleeding, enlarge lymph nodes, fevers, pallor, and skin discoloration.   Physical Exam  General:     Well-developed,well-nourished,in no acute distress; alert,appropriate and cooperative throughout examinationoverweight-appearing.   Head:     Normocephalic and atraumatic without obvious abnormalities. No apparent alopecia or balding. Eyes:     No corneal or conjunctival inflammation noted. EOMI. Perrla. Funduscopic exam benign, without hemorrhages, exudates or papilledema. Vision grossly normal. Ears:     External ear exam shows no significant lesions or deformities.  Otoscopic examination reveals clear  canals, tympanic membranes are intact bilaterally without bulging, retraction, inflammation or discharge. Hearing is grossly normal bilaterally. Nose:     External nasal examination shows no deformity or inflammation. Nasal mucosa are pink and moist without lesions or exudates. Mouth:      Oral mucosa and oropharynx without lesions or exudates.  Teeth in good repair. Chest Wall:     CABG scar and pacemaker left upper chest Breasts:     No masses or gynecomastia noted Lungs:     Normal respiratory effort, chest expands symmetrically. Lungs are clear to auscultation, no crackles or wheezes. Heart:     Normal rate and regular rhythm. S1 and S2 normal without gallop, murmur, click, rub or other extra sounds. Abdomen:     Bowel sounds positive,abdomen soft and non-tender without masses, organomegaly or hernias noted. Rectal:     No external abnormalities noted. Normal sphincter tone. No rectal masses or tenderness. Genitalia:     Testes bilaterally descended without nodularity, tenderness or masses. No scrotal masses or lesions. No penis lesions or urethral discharge. Prostate:     Prostate gland firm and smooth, no enlargement, nodularity, tenderness, mass, asymmetry or induration. Msk:     full range of motion shoulders but pain on extension Pulses:     R and L carotid,radial,femoral,dorsalis pedis and posterior tibial pulses are full and equal bilaterally Extremities:     left pretibial edema and right pretibial edema.   Neurologic:     No cranial nerve deficits noted. Station and gait are normal. Plantar reflexes are down-going bilaterally. DTRs are symmetrical throughout. Sensory, motor and coordinative functions appear intact. Skin:     non dx rash lower extremities, same as father, no symptoms Cervical Nodes:     No lymphadenopathy noted Axillary Nodes:     No palpable lymphadenopathy Inguinal Nodes:     No significant adenopathy Psych:     Cognition and judgment appear intact. Alert and cooperative with normal attention span and concentration. No apparent delusions, illusions, hallucinations    Impression & Recommendations:  Problem # 1:  OSTEOPENIA (ICD-733.90) Assessment: Unchanged Flu Vaccine Consent Questions     Do you have a history of severe  allergic reactions to this vaccine? no    Any prior history of allergic reactions to egg and/or gelatin? no    Do you have a sensitivity to the preservative Thimersol? no    Do you have a past history of Guillan-Barre Syndrome? no    Do you currently have an acute febrile illness? no    Have you ever had a severe reaction to latex? no    Vaccine information given and explained to patient? yes    Are you currently pregnant? no    Lot Number:[id number]   Exp Date:08/16/2008   Site Given  Left Deltoid I........Marland KitchenPura Spice, RN  December 07, 2007 9:40 AM      Orders: T-Vitamin D (25-Hydroxy) [phone])   Problem # 2:  HYPERLIPIDEMIA (ICD-272.4) Assessment: Improved  His updated medication list for this problem includes:    Simvastatin 40 Mg Tabs (Simvastatin) ..... Once daily at hs  Orders: Venipuncture ([address]782) TLB-Lipid Panel (80061-LIPID) TLB-Hepatic/Liver Function Pnl (80076-HEPATIC)   Problem # 3:  ARTHRITIS, SHOULDERS, BILATERAL (ICD-716.91) Assessment: Unchanged  Problem # 4:  CORONARY ARTERY DISEASE (ICD-414.00) Assessment: Improved  The following medications were removed from the medication list:    Metoprolol Tartrate 100 Mg Tabs (Metoprolol tartrate) .[name]... 1/2 tab once daily  His updated medication list for this  problem includes:    [facility]vs Aspirin Ec 325 Mg Tbec (Aspirin) ..... Once daily    Lisinopril 10 Mg Tabs (Lisinopril) .[name]... 1/2 tab once daily    Metoprolol Tartrate 50 Mg Tabs (Metoprolol tartrate) .[name]... 1 two times a day   Problem # 5:  CORONARY ARTERY DISEASE (ICD-414.00)  Complete Medication List: 1)  Simvastatin 40 Mg Tabs (Simvastatin) .... Once daily at hs 2)  [facility]vs Aspirin Ec 325 Mg Tbec (Aspirin) .... Once daily 3)  Glucosamine-chondroitin 1500-1200 Mg/6ml Liqd (Glucosamine-chondroitin) .... Two times a day 4)  Bl Vitamin B-6 100 Mg Tabs (Pyridoxine hcl) .... Two two times a day 5)  Klor-con M20 20 Meq Tbcr (Potassium chloride crys cr) ....  Once daily 6)  Lisinopril 10 Mg Tabs (Lisinopril) .... 1/2 tab once daily 7)  Pantoprazole Sodium 40 Mg Tbec (Pantoprazole sodium) .... Once daily 8)  Metoprolol Tartrate 50 Mg Tabs (Metoprolol tartrate) .[name].. 1 two times a day  Other Orders: Flu Vaccine 64yrs + ([address]) Administration Flu vaccine (G0008) UA Dipstick w/o Micro (automated)  (81003) TLB-BMP (Basic Metabolic Panel-BMET) (80048-METABOL) TLB-CBC Platelet - w/Differential (85025-CBCD) TLB-TSH (Thyroid Stimulating Hormone) (84443-TSH) TLB-PSA (Prostate Specific Antigen) (84153-PSA)   Patient Instructions: 1)  doing fine 2)  continue under care Dr. Butler Denmark for shoulders and  3)  Dr. Excell Seltzer for cardiac proble 4)  recommend weight loss as discussed 5)  Had zostavax 1 1/2 yrs ago 6)  continue other medications   Prescriptions: PANTOPRAZOLE SODIUM 40 MG TBEC (PANTOPRAZOLE SODIUM) once daily  #90 x 3   Entered and Authorized by:   Judithann Sheen MD   Signed by:   Judithann Sheen MD on 12/07/2007   Method used:   Print then Give to Patient   RxID:   [id number] LISINOPRIL 10 MG  TABS (LISINOPRIL) 1/2 tab once daily  #90 x 3   Entered and Authorized by:   Judithann Sheen MD   Signed by:   Judithann Sheen MD on 12/07/2007   Method used:   Print then Give to Patient   RxID:   [id number] METOPROLOL TARTRATE 50 MG TABS (METOPROLOL TARTRATE) 1 two times a day  #180 x 3   Entered and Authorized by:   Judithann Sheen MD   Signed by:   Judithann Sheen MD on 12/07/2007   Method used:   Print then Give to Patient   RxID:   [id number] KLOR-CON M20 20 MEQ  TBCR (POTASSIUM CHLORIDE CRYS CR) once daily  #90 x 3   Entered and Authorized by:   Judithann Sheen MD   Signed by:   Judithann Sheen MD on 12/07/2007   Method used:   Print then Give to Patient   RxID:   [id number] SIMVASTATIN 40 MG  TABS (SIMVASTATIN) once daily at hs  #90 x 3   Entered and Authorized by:   Judithann Sheen MD   Signed by:   Judithann Sheen MD on 12/07/2007   Method used:   Print then Give to Patient   RxID:   [id number]  ]  Other Immunization History:    Zostavax # 1:  Zostavax (09/16/2007)   Laboratory Results   Urine Tests   Date/Time Reported: December 07, 2007 2:53 PM   Routine Urinalysis   Color: yellow Appearance: Clear Glucose: negative   (Normal Range: Negative) Bilirubin: negative   (Normal Range: Negative) Ketone: negative   (  Normal Range: Negative) Spec. Gravity: 1.020   (Normal Range: 1.003-1.035) Blood: negative   (Normal Range: Negative) pH: 5.0   (Normal Range: 5.0-8.0) Protein: negative   (Normal Range: Negative) Urobilinogen: 0.2   (Normal Range: 0-1) Nitrite: negative   (Normal Range: Negative) Leukocyte Esterace: negative   (Normal Range: Negative)    Comments: Wynona Canes, CMA  December 07, 2007 2:53 PM

## 2010-03-19 NOTE — Assessment & Plan Note (Signed)
Summary: [facility]/DMP   Primary Provider:  Judithann Sheen MD  CC:  Device Check.  History of Present Illness: [name] is seen in followup for abortive cardiac arrest in the setting of modest ischemic cardiomyopathy with prior bypass surgery and an ejection fraction of 40-45%. He is status post ICD implantation He has had no recurrent events..  Current Medications (verified): 1)  Simvastatin 40 Mg  Tabs (Simvastatin) .... Once Daily At [facility] 2)  [facility]vs Aspirin Ec 325 Mg  Tbec (Aspirin) .... Once Daily 3)  Glucosamine-Chondroitin 1500-1200 Mg/66ml  Liqd (Glucosamine-Chondroitin) .... Two Times A Day 4)  Bl Vitamin B-6 100 Mg  Tabs (Pyridoxine Hcl) .... Two Two Times A Day 5)  Klor-Con M20 20 Meq  Tbcr (Potassium Chloride Crys Cr) .... Once Daily 6)  Lisinopril 10 Mg  Tabs (Lisinopril) .... 1/2 Tab Once Daily 7)  Pantoprazole Sodium 40 Mg Tbec (Pantoprazole Sodium) .... Once Daily 8)  Metoprolol Succinate 50 Mg Xr24h-Tab (Metoprolol Succinate) .... Take One Tablet By Mouth Daily 9)  Viagra 50 Mg Tabs (Sildenafil Citrate) .... Take As Directed 10)  Centrum Silver  Tabs (Multiple Vitamins-Minerals) .... Take 1 By Mouth Once Daily 11)  Fish Oil 1000 Mg Caps (Omega-3 Fatty Acids) .... Take 1 By Mouth Once Daily  Allergies (verified): No Known Drug Allergies  Past History:  Past Medical History:    CARDIOMYOPATHY, ISCHEMIC   EF 45%    ICD - IN SITU (ICD-V45.02)    CAD, ARTERY BYPASS GRAFT (ICD-414.04)    HYPERLIPIDEMIA (ICD-272.4)    ARTHRITIS, SHOULDERS, BILATERAL (ICD-716.91)    UTI (ICD-599.0)    OSTEOPENIA (ICD-733.90)    HYPOTHYROIDISM (ICD-244.9)    UNSPECIFIED ANEMIA (ICD-285.9)    UNS ADVRS EFF OTH RX MEDICINAL&BIOLOGICAL SBSTNC (ICD-995.29)    SPECIAL SCREENING MALIGNANT NEOPLASM OF PROSTATE (ICD-V76.44)       Vital Signs:  Patient profile:   75 year old male Height:      72 inches Weight:      236 pounds Pulse rate:   60 / minute BP sitting:   110 / 62  (left  arm)  Vitals Entered By: Stanton Kidney, EMT-P (Jul 11, 2008 2:59 PM)  Physical Exam  General:  The patient was alert and oriented in no acute distress.Neck veins were flat, carotids were brisk. Lungs were clear. Heart sounds were regular without murmurs or gallops. Abdomen was soft with active bowel sounds. There is no clubbing cyanosis or edema.    ICD Specifications Following MD:  Sherryl Manges, MD     ICD Vendor:  [facility]     ICD Model Number:  [phone]     ICD Serial Number:  [id number] ICD DOI:  08/11/2006     ICD Implanting MD:  Sherryl Manges, MD  Lead 1:    Location: RV     DOI: 08/11/2006     Model #: [id number]     Serial #: [id number]     Status: active  Indications::  VF ARREST   ICD Specs Remote Check?  No Battery Voltage:  3.20 V     Charge Time:  10.3 seconds     Underlying rhythm:  SR@65  ICD Dependent:  No       ICD Device Measurements Atrium:  Right Ventricle:  Amplitude: 9.7 mV, Impedance: 590 ohms, Threshold: 1.0 V at 0.5 msec Left Ventricle:  Shock Impedance: 48 ohms   Episodes Shock:  0     ATP:  0  Nonsustained:  0     Ventricular Pacing:  1%  Brady Parameters Mode VVI     Lower Rate Limit:  40      Tachy Zones VF:  240     VT:  200     VT1:  176     Next Remote Date:  10/09/2008     Tech Comments:  RV 2.0@0 .5 Merlin  Impression & Recommendations:  Problem # 1:  CARDIAC ARREST (ICD-427.5) Rhtyhm stable His updated medication list for this problem includes:    Cvs Aspirin Ec 325 Mg Tbec (Aspirin) ..... Once daily    Lisinopril 10 Mg Tabs (Lisinopril) .[name]... 1/2 tab once daily    Metoprolol Succinate 50 Mg Xr24h-tab (Metoprolol succinate) .[name]... Take one tablet by mouth daily  Problem # 2:  CARDIOMYOPATHY, ISCHEMIC  EF 40-45% (ICD-414.8) No recurrent chest pain;  will continue current medicines His updated medication list for this problem includes:    Cvs Aspirin Ec 325 Mg Tbec (Aspirin) ..... Once daily    Lisinopril 10 Mg Tabs (Lisinopril) .[name]... 1/2 tab once  daily    Metoprolol Succinate 50 Mg Xr24h-tab (Metoprolol succinate) .[name]... Take one tablet by mouth daily  Problem # 3:  ICD -SJ (ICD-V45.02) Stable ICD improvement  Problem # 4:  HYPERLIPIDEMIA (ICD-272.4) There are recent data that suggest that pravastatin may be better than simva for rhythm stabilization and will defer this script to Dr Scotty Court His updated medication list for this problem includes:    Simvastatin 40 Mg Tabs (Simvastatin) ..... Once daily at hs  Patient Instructions: 1)  Your physician recommends that you schedule a follow-up appointment in: 6 months with Dr. Excell Seltzer and 12 months with Dr. Graciela Husbands

## 2010-03-19 NOTE — Letter (Signed)
Summary: Remote Device Check  [facility], Main Office  [address]   [place], [place] [address]   Phone: [phone]  Fax: [phone]     January 10, 2009 MRN: [id number]   [facility] [address] [place], [place]  [address]   Dear Mr. [name],   Your remote transmission was recieved and reviewed by your physician.  All diagnostics were within normal limits for you.  __X___Your next transmission is scheduled for:  April 09, 2009.  Please transmit at any time this day.  If you have a wireless device your transmission will be sent automatically.     Sincerely,  Proofreader

## 2010-03-19 NOTE — Cardiovascular Report (Signed)
Summary: Office Visit Remote  Office Visit Remote   Imported By: Roderic Ovens 10/27/2008 14:25:44  _____________________________________________________________________  External Attachment:    Type:   Image     Comment:   External Document

## 2010-03-19 NOTE — Letter (Signed)
Summary: Appointment - Missed  [facility], Main Office  [address]   [place], [place] [address]   Phone: [phone]  Fax: [phone]     October 12, 2009 MRN: [id number]   [name] [address] [place], [place]  [address]   Dear Mr. Hyer,  Our records indicate you missed your appointment on August 17th, 2011 with Dr. [facility].  It is very important that we reach you to reschedule this appointment. We look forward to participating in your health care needs. Please contact us at the number listed above at your earliest convenience to reschedule this appointment.     Sincerely,    Glass blower/designer

## 2010-03-19 NOTE — Letter (Signed)
Summary: [facility]  [facility] Provider: This provider was preselected by the workflow.  Signature: The signature status of this document was preset by the workflow  Processed by [employer] Local Indexer Client @ Tuesday, February 06, 2009 8:40:21 AM using version:2010.1.2.11(2.4)   Manually Indexed By: [id number]  idlBatchDetail: [id number]   _____________________________________________________________________  External Attachment:    Type:   Image     Comment:   External Document

## 2010-03-19 NOTE — Assessment & Plan Note (Signed)
Summary: pt fell off ladder/leg is swollen/bruising/cjr   Vital Signs:  Patient profile:   75 year old male Weight:      241 pounds BMI:     32.80 Temp:     98.0 degrees F oral BP sitting:   112 / 60  (left arm) Cuff size:   large  Vitals Entered By: Alfred Levins, CMA (November 07, 2008 11:42 AM) CC: lt leg pain and swelling   History of Present Illness: Here for swelling and pain in the lower left leg after he fell down a ladder on 11-02-08. He struck the front of the shin just below the knee, and this area was immediately painful. Now the entire front of the leg is painful from the knee to the foot,it is swollen, and it is dusky red in color. It is not hot or warm, and he has not had a fever. No calf pain. No chest pain or SOB. Using ice. 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   Allergies (verified): No Known Drug Allergies  Past History:  Past Medical History: Reviewed history from 07/11/2008 and no changes required. CARDIOMYOPATHY, ISCHEMIC   EF 45% ICD - IN SITU (ICD-V45.02) CAD, ARTERY BYPASS GRAFT (ICD-414.04) HYPERLIPIDEMIA (ICD-272.4) ARTHRITIS, SHOULDERS, BILATERAL (ICD-716.91) UTI (ICD-599.0) OSTEOPENIA (ICD-733.90) HYPOTHYROIDISM (ICD-244.9) UNSPECIFIED ANEMIA (ICD-285.9) UNS ADVRS EFF OTH RX MEDICINAL&BIOLOGICAL SBSTNC (ICD-995.29) SPECIAL SCREENING MALIGNANT NEOPLASM OF PROSTATE (ICD-V76.44)    Past Surgical History: Reviewed history from 07/10/2008 and no changes required. CABG 1996 ICD  [employer] Jude current VRRF - 07/2006  Review of Systems  The patient denies anorexia, fever, weight loss, weight gain, vision loss, decreased hearing, hoarseness, chest pain, syncope, dyspnea on exertion, peripheral edema, prolonged cough, headaches, hemoptysis, abdominal pain, melena, hematochezia, severe indigestion/heartburn, hematuria, incontinence, genital sores, muscle weakness, suspicious skin lesions, transient blindness, difficulty  walking, depression, unusual weight change, abnormal bleeding, enlarged lymph nodes, angioedema, breast masses, and testicular masses.    Physical Exam  General:  Well-developed,well-nourished,in no acute distress; alert,appropriate and cooperative throughout examination Msk:  left lower leg is mildly swollen from the knee to the foot. The skin is dusky red but not warm or blanchable. he is tender along the entire anterior shin. No calf tenderness or cords. Negative Homans . Pulses in the foot are full. he has an abrasion on the anterior shin just below the tibial tubercle which is quite tender and fluctuant.    Impression & Recommendations:  Problem # 1:  CONTUSION, LOWER LEG, LEFT (ICD-924.10)  Complete Medication List: 1)  Simvastatin 40 Mg Tabs (Simvastatin) .... Once daily at hs 2)  [facility] Aspirin Ec 325 Mg Tbec (Aspirin) .... Once daily 3)  Glucosamine-chondroitin 1500-1200 Mg/35ml Liqd (Glucosamine-chondroitin) .... Two times a day 4)  Bl Vitamin B-6 100 Mg Tabs (Pyridoxine hcl) .... Two two times a day 5)  Klor-con M20 20 Meq Tbcr (Potassium chloride crys cr) .... Once daily 6)  Lisinopril 10 Mg Tabs (Lisinopril) .... 1/2 tab once daily 7)  Pantoprazole Sodium 40 Mg Tbec (Pantoprazole sodium) .... Once daily 8)  Metoprolol Succinate 50 Mg Xr24h-tab (Metoprolol succinate) .... Take one tablet by mouth daily 9)  Viagra 50 Mg Tabs (Sildenafil citrate) .... Take as directed 10)  Centrum Silver Tabs (Multiple vitamins-minerals) .... Take 1 by mouth once daily 11)  Fish Oil 1000 Mg Caps (Omega-3 fatty acids) .... Take 1 by mouth once daily  Other Orders: T-Tib/Fib Left (44010UV)  Patient Instructions: 1)  Get Xrays  of the lower leg to rule out fractures. No evidence of infection or thrombosis. He is to stay off his feet for several days, elevate the leg, use ice packs.

## 2010-03-19 NOTE — Cardiovascular Report (Signed)
Summary: Office Visit Remote  Office Visit Remote   Imported By: Roderic Ovens 01/15/2009 11:52:50  _____________________________________________________________________  External Attachment:    Type:   Image     Comment:   External Document

## 2010-03-19 NOTE — Progress Notes (Signed)
Summary: referral  Phone Note Call from Patient Call back at Home Phone [phone]   Caller: Patient Call For: stafford Reason for Call: Referral Summary of Call: pt would like to be referred to hand specialist ?carpal tunnel/pt has medicare/bcbs/?dr gramig Initial call taken by: Heron Sabins,  October 07, 2006 4:50 PM  Follow-up for Phone Call        09/24 @ 9:00 with Dr. Leona Carry message     Referral sent from Gina/called before it was routed Follow-up by: Florentina Addison,  October 08, 2006 9:48 AM

## 2010-03-19 NOTE — Cardiovascular Report (Signed)
Summary: Office Visit   Office Visit   Imported By: Marylou Mccoy 07/18/2008 14:53:37  _____________________________________________________________________  External Attachment:    Type:   Image     Comment:   External Document

## 2010-03-19 NOTE — Miscellaneous (Signed)
Summary: DEVICE PRELOAD  Clinical Lists Changes  Observations: Added new observation of ICD INDICATN: VF ARREST (04/06/2008 16:07) Added new observation of ICDLEADSTAT1: active (04/06/2008 16:07) Added new observation of ICDLEADSER1: [id number] (04/06/2008 16:07) Added new observation of ICDLEADMOD1: 7121  (04/06/2008 16:07) Added new observation of ICDLEADDOI1: 08/11/2006  (04/06/2008 16:07) Added new observation of ICDLEADLOC1: RV  (04/06/2008 16:07) Added new observation of ICD IMP MD: Sherryl Manges, MD  (04/06/2008 16:07) Added new observation of ICD IMPL DTE: 08/11/2006  (04/06/2008 16:07) Added new observation of ICD SERL#: [id number]  (04/06/2008 16:07) Added new observation of ICD MODL#: 1207-36  (04/06/2008 16:07) Added new observation of ICDMANUFACTR: St Jude  (04/06/2008 16:07) Added new observation of CARDIO MD: Sherryl Manges, MD  (04/06/2008 16:07)      ICD Specifications Following MD:  Sherryl Manges, MD     ICD Vendor:  St Jude     ICD Model Number:  [id number]     ICD Serial Number:  [id number] ICD DOI:  08/11/2006     ICD Implanting MD:  Sherryl Manges, MD  Lead 1:    Location: RV     DOI: 08/11/2006     Model #: [id number]     Serial #: [id number]     Status: active  Indications::  VF ARREST

## 2010-03-19 NOTE — Cardiovascular Report (Signed)
Summary: Office Visit   Office Visit   Imported By: Roderic Ovens 06/19/2009 16:37:19  _____________________________________________________________________  External Attachment:    Type:   Image     Comment:   External Document

## 2010-03-19 NOTE — Cardiovascular Report (Signed)
Summary: Office Visit Remote   Office Visit Remote   Imported By: Roderic Ovens 10/08/2009 13:20:24  _____________________________________________________________________  External Attachment:    Type:   Image     Comment:   External Document

## 2010-03-19 NOTE — Letter (Signed)
Summary: Remote Device Check  [facility], Main Office  [address]   [place], [place] [address]   Phone: [phone]  Fax: [phone]     October 05, 2009 MRN: [id number]   [name] [address] [place], [place]  [address]   Dear [name],   Your remote transmission was recieved and reviewed by your physician.  All diagnostics were within normal limits for you.  __X___Your next transmission is scheduled for: 12-20-2009.  Please transmit at any time this day.  If you have a wireless device your transmission will be sent automatically.   Sincerely,  Vella Kohler

## 2010-03-19 NOTE — Progress Notes (Signed)
Summary: NEEDS 2 NEW RX pls triage ?meds ?drug store  Phone Note Call from Patient Call back at Work Phone [phone]   Caller: PT LIVE Call For: [name]  Summary of Call: HE FORGOT TO ASK FOR 2 NEW RX'S AT HIS PHYSICAL PLEASE CALL THE PATIENT  Initial call taken by: Roselle Locus,  April 13, 2008 1:36 PM  Follow-up for Phone Call        pt wants new rx for viagra 50 mg  ok per dr Scotty Court  Follow-up by: Pura Spice, RN,  April 13, 2008 3:03 PM    New/Updated Medications: VIAGRA 50 MG TABS (SILDENAFIL CITRATE) take as directed   Prescriptions: VIAGRA 50 MG TABS (SILDENAFIL CITRATE) take as directed  #8 x 11   Entered by:   Pura Spice, RN   Authorized by:   Judithann Sheen MD   Signed by:   Pura Spice, RN on 04/13/2008   Method used:   Electronically to        [facility] [phone] (retail)       [address]       [place], [place]  [address]       Ph: [phone] or [phone]       Fax: [phone]   RxID:   [id number]

## 2010-03-19 NOTE — Progress Notes (Signed)
Summary: pantporazole refill  Phone Note Refill Request Message from:  Fax from Pharmacy on January 25, 2010 1:28 PM  Refills Requested: Medication #1:  PANTOPRAZOLE SODIUM 40 MG TBEC once daily Initial call taken by: Kern Reap CMA Duncan Dull),  January 25, 2010 1:29 PM    Prescriptions: PANTOPRAZOLE SODIUM 40 MG TBEC (PANTOPRAZOLE SODIUM) once daily  #90 x 0   Entered by:   Kern Reap CMA (AAMA)   Authorized by:   Judithann Sheen MD   Signed by:   Kern Reap CMA (AAMA) on 01/25/2010   Method used:   Electronically to        [facility] [phone][facility] (retail)       [address]       [place], [place]  [address]       Ph: [phone] or [phone]       Fax: [phone]   RxID:   [id number]

## 2010-03-19 NOTE — Progress Notes (Signed)
Summary: Refill Request  Phone Note Call from Patient Call back at [phone]   Caller: PT WALK IN Reason for Call: Refill Medication Summary of Call: Needs 3 mnth supply of simvastin 40mg  tab sent electronically to [facility] ASAP.  Initial call taken by: Trixie Dredge,  Jul 04, 2008 12:13 PM  Follow-up for Phone Call        ok per dr Scotty Court  needs labs pt wife informed  Follow-up by: Pura Spice, RN,  Jul 04, 2008 3:34 PM    New/Updated Medications: SIMVASTATIN 40 MG  TABS (SIMVASTATIN) once daily at hs   Prescriptions: SIMVASTATIN 40 MG  TABS (SIMVASTATIN) once daily at hs  #90 x 0   Entered by:   Pura Spice, RN   Authorized by:   Judithann Sheen MD   Signed by:   Pura Spice, RN on 07/04/2008   Method used:   Electronically to        [facility]* (mail-order)             ,          Ph: [phone]       Fax: [phone]   RxID:   [id number]

## 2010-03-19 NOTE — Letter (Signed)
Summary: Generic Letter  [place] at [facility]  [address] [place], [place] [address]   Phone: [phone]  Fax: [phone]    01/17/2008  SAMIEL PEEL [address] [place], [place]  [address]  Dear Mr. Filippi,   HEMOCULT CARDS WERE NEGATIVE.         Sincerely,      DR Raoul Pitch, MD  [place] at [facility]

## 2010-03-19 NOTE — Letter (Signed)
Summary: Remote Device Check  [facility], Main Office  [address]   [place], [place] [address]   Phone: [phone]  Fax: [phone]     December 31, 2009 MRN: [id number]   [name] [address] [place], [place]  [address]   Dear Mr. Castilleja,   Your remote transmission was recieved and reviewed by your physician.  All diagnostics were within normal limits for you.  __X___Your next transmission is scheduled for: 03-21-2010.  Please transmit at any time this day.  If you have a wireless device your transmission will be sent automatically.   Sincerely,  Vella Kohler

## 2010-03-19 NOTE — Letter (Signed)
Summary: Generic Letter  [place] at [facility]  [address] [place], [place] [address]   Phone: [phone]  Fax: [phone]    02/08/2009  [name] [address] [place], [place]  [address]  Dear [name],   HEMOCULT CARDS NEGATIVE.         Sincerely,   DR Gwenyth Bender STAFFORD,MD

## 2010-03-19 NOTE — Letter (Signed)
Summary: [facility] note  [facility] note   Imported By: Kassie Mends 11/25/2006 15:31:31  _____________________________________________________________________  External Attachment:    Type:   Image     Comment:   [facility] note

## 2010-03-19 NOTE — Assessment & Plan Note (Signed)
Summary: emp-will fast//ccm/ pt rsc/njr   Vital Signs:  Patient profile:   75 year old male Height:      72 inches Weight:      242 pounds BMI:     32.94 O2 Sat:      98 % Temp:     97.5 degrees F Pulse rate:   79 / minute Pulse rhythm:   regular BP sitting:   120 / 72  (left arm) Cuff size:   large  Vitals Entered By: Pura Spice, RN (January 18, 2009 8:38 AM) CC: go over problems refill meds to [facility] Is Patient Diabetic? No Pain Assessment Patient in pain? no        History of Present Illness: This 75 year old white male who has coronary artery disease and had cardiac arrest in town for myocardial infarction in the past. The patient is in to discuss his multiple medical problems as well as refilled his medicines. He also is in complaining of a recent upper respiratory infection including nasal congestion and frontal pain for the past 2-3 days, also yellow-green nasal discharge review also been complaining of a cough for the past 2-3 days productive of yellow-green sputum. He has had general myelination some aching and has had to limit his activities. His right knee continues to give him pain and has been nonresponsive to treatment so will refer to Dr. Ferne Reus did offer he Cardiac pace maker is check monthly by Dr. Graciela Husbands or Dr. Tonny Bollman Of interest patient started smoking in 1955 and top in 1970 Hypertension has been well controlled and is stable  Preventive Screening-Counseling & Management  Alcohol-Tobacco     Smoking Status: quit > 6 months     Packs/Day: 1.0     Year Started: 1955     Year Quit: 1970  Caffeine-Diet-Exercise     Does Patient Exercise: no  Allergies (verified): No Known Drug Allergies  Past History:  Past Medical History: Last updated: 07/11/2008 CARDIOMYOPATHY, ISCHEMIC   EF 45% ICD - IN SITU (ICD-V45.02) CAD, ARTERY BYPASS GRAFT (ICD-414.04) HYPERLIPIDEMIA (ICD-272.4) ARTHRITIS, SHOULDERS, BILATERAL (ICD-716.91) UTI  (ICD-599.0) OSTEOPENIA (ICD-733.90) HYPOTHYROIDISM (ICD-244.9) UNSPECIFIED ANEMIA (ICD-285.9) UNS ADVRS EFF OTH RX MEDICINAL&BIOLOGICAL SBSTNC (FAO-130.86) SPECIAL SCREENING MALIGNANT NEOPLASM OF PROSTATE (ICD-V76.44)    Social History: Last updated: 07/10/2008 Full Time Married  Tobacco Use - No.  Alcohol Use - yes - occasional Drug Use - no  Risk Factors: Smoking Status: quit > 6 months (01/18/2009) Packs/Day: 1.0 (01/18/2009)  Past Surgical History: CABG 1996 ICD  St. Jude current VRRF - 07/2006 pacemaker in place  Past History:  Care Management: Cardiology:, Dr Graciela Husbands  Gastroenterology:, Noel Journey I  Ophthalmology: Dr Verdene Rio   Social History: Smoking Status:  quit > 6 months Packs/Day:  1.0 Does Patient Exercise:  no  Review of Systems      See HPI General:  See HPI; Complains of fever and malaise. Eyes:  Denies blurring, discharge, double vision, eye irritation, eye pain, halos, itching, light sensitivity, red eye, vision loss-1 eye, and vision loss-both eyes. ENT:  Complains of nasal congestion; facial pain over her frontal sinuses. CV:  See HPI; Complains of shortness of breath with exertion; past history of CABG, also has pacemaker past history of cardiac arrest at time of his myocardial infarction. Resp:  Complains of cough and shortness of breath. GI:  GERD controlled with Prilosec. GU:  Complains of erectile dysfunction. MS:  See HPI; Complains of joint pain. Derm:  Denies changes in color  of skin, changes in nail beds, dryness, excessive perspiration, flushing, hair loss, insect bite(s), itching, lesion(s), poor wound healing, and rash. Neuro:  Denies brief paralysis, difficulty with concentration, disturbances in coordination, falling down, headaches, inability to speak, memory loss, numbness, poor balance, seizures, sensation of room spinning, tingling, tremors, visual disturbances, and weakness. Psych:  Denies alternate hallucination (  auditory/visual), anxiety, depression, easily angered, easily tearful, irritability, mental problems, panic attacks, sense of great danger, suicidal thoughts/plans, thoughts of violence, unusual visions or sounds, and thoughts /plans of harming others.  Physical Exam  General:  Well-developed,well-nourished,in no acute distress; alert,appropriate and cooperative throughout examinationoverweight-appearing.   Head:  tenderness over the frontal sinuses Eyes:  No corneal or conjunctival inflammation noted. EOMI. Perrla. Funduscopic exam benign, without hemorrhages, exudates or papilledema. Vision grossly normal. Ears:  External ear exam shows no significant lesions or deformities.  Otoscopic examination reveals clear canals, tympanic membranes are intact bilaterally without bulging, retraction, inflammation or discharge. Hearing is grossly normal bilaterally. Nose:  nasal congestion with erythematous mucosa and yellowish discharge Mouth:  Oral mucosa and oropharynx without lesions or exudates.  Teeth in good repair. Neck:  No deformities, masses, or tenderness noted. Chest Wall:  CABG scarpacemaker left upper chest Breasts:  No masses or gynecomastia noted Lungs:  rhonchi bilaterally occasional wheezes on deep  inspirationno dull to percussion Heart:  Normal rate and regular rhythm. S1 and S2 normal without gallop, murmur, click, rub or other extra sounds. Abdomen:  Bowel sounds positive,abdomen soft and non-tender without masses, organomegaly or hernias noted. Rectal:  No external abnormalities noted. Normal sphincter tone. No rectal masses or tenderness. Genitalia:  Testes bilaterally descended without nodularity, tenderness or masses. No scrotal masses or lesions. No penis lesions or urethral discharge. Prostate:  1+ enlarged.   Msk:  swollen tender right knee pain to palpation Pulses:  R and L carotid,radial,femoral,dorsalis pedis and posterior tibial pulses are full and equal  bilaterally Extremities:  No clubbing, cyanosis, edema, or deformity noted with normal full range of motion of all joints.   Neurologic:  No cranial nerve deficits noted. Station and gait are normal. Plantar reflexes are down-going bilaterally. DTRs are symmetrical throughout. Sensory, motor and coordinative functions appear intact. Skin:  Intact without suspicious lesions or rashes Cervical Nodes:  No lymphadenopathy noted Axillary Nodes:  No palpable lymphadenopathy Inguinal Nodes:  No significant adenopathy Psych:  Cognition and judgment appear intact. Alert and cooperative with normal attention span and concentration. No apparent delusions, illusions, hallucinations   Impression & Recommendations:  Problem # 1:  GERD (ICD-530.81) Assessment Improved  His updated medication list for this problem includes:    Pantoprazole Sodium 40 Mg Tbec (Pantoprazole sodium) ..... Once daily  Problem # 2:  BACTERIAL  RHINITIS (ICD-460) Assessment: New  The following medications were removed from the medication list:    Sulindac Powd (Sulindac) ..... One b.i.d. p.c. His updated medication list for this problem includes:    [facility] Aspirin Ec 325 Mg Tbec (Aspirin) ..... Once daily  Problem # 3:  ACUTE BRONCHITIS (ICD-466.0) Assessment: New  His updated medication list for this problem includes:    Zithromax Z-pak 250 Mg Tabs (Azithromycin) .[name]... 2 stat then 1 per day  Orders: Prescription Created Electronically [phone])  Problem # 4:  OSTEOARTHRITIS, KNEE, RIGHT (ICD-715.96) Assessment: Deteriorated  The following medications were removed from the medication list:    Sulindac Powd (Sulindac) ..... One b.i.d. p.c. His updated medication list for this problem includes:    [facility] Aspirin  Ec 325 Mg Tbec (Aspirin) ..... Once daily  Problem # 5:  CARDIAC PACEMAKER IN SITU (ICD-V45.01) Assessment: Improved  Problem # 6:  CAD, ARTERY BYPASS GRAFT (ICD-414.04) Assessment: Improved  His updated  medication list for this problem includes:    [facility] Aspirin Ec 325 Mg Tbec (Aspirin) ..... Once daily    Lisinopril 10 Mg Tabs (Lisinopril) .[name]... 1/2 tab once daily    Metoprolol Succinate 50 Mg Xr24h-tab (Metoprolol succinate) .[name]... Take one tablet by mouth daily  Problem # 7:  HYPERLIPIDEMIA (ICD-272.4) Assessment: Improved  His updated medication list for this problem includes:    Simvastatin 40 Mg Tabs (Simvastatin) ..... Once daily at hs  Orders: Venipuncture (32992) TLB-Lipid Panel (80061-LIPID) TLB-Hepatic/Liver Function Pnl (80076-HEPATIC)  Problem # 8:  OSTEOPENIA (ICD-733.90) Assessment: Improved  Orders: T-Vitamin D (25-Hydroxy) (42683-4[address])  Complete Medication List: 1)  Simvastatin 40 Mg Tabs (Simvastatin) .... Once daily at hs 2)  [facility] Aspirin Ec 325 Mg Tbec (Aspirin) .... Once daily 3)  Glucosamine-chondroitin 1500-1200 Mg/52ml Liqd (Glucosamine-chondroitin) .... Two times a day 4)  Bl Vitamin B-6 100 Mg Tabs (Pyridoxine hcl) .... Two two times a day 5)  Klor-con M20 20 Meq Tbcr (Potassium chloride crys cr) .... Once daily 6)  Lisinopril 10 Mg Tabs (Lisinopril) .... 1/2 tab once daily 7)  Pantoprazole Sodium 40 Mg Tbec (Pantoprazole sodium) .... Once daily 8)  Metoprolol Succinate 50 Mg Xr24h-tab (Metoprolol succinate) .... Take one tablet by mouth daily 9)  Viagra 50 Mg Tabs (Sildenafil citrate) .... Take as directed 10)  Centrum Silver Tabs (Multiple vitamins-minerals) .... Take 1 by mouth once daily 11)  Fish Oil 1000 Mg Caps (Omega-3 fatty acids) .... Take 1 by mouth once daily 12)  Zithromax Z-pak 250 Mg Tabs (Azithromycin) .... 2 stat then 1 per day  Other Orders: UA Dipstick w/o Micro (automated)  (81003) Orthopedic Referral (Ortho) TLB-BMP (Basic Metabolic Panel-BMET) (80048-METABOL) TLB-CBC Platelet - w/Differential (85025-CBCD) TLB-TSH (Thyroid Stimulating Hormone) (84443-TSH) TLB-PSA (Prostate Specific Antigen) (84153-PSA)  Patient Instructions: 1)   At this time you have an upper SPARC or infection as well as bronchitis 2)  Amoxicillin 500 mg 3 times daily plus Mucinex DM maximum strength one twice daily 3)  Will arrange a pulmonary doctor on [facility] regarding deterioration of the right knee Prescriptions: ZITHROMAX Z-PAK 250 MG TABS (AZITHROMYCIN) 2 stat then 1 per day  #1 pkge x 0   Entered and Authorized by:   Judithann Sheen MD   Signed by:   Judithann Sheen MD on 01/18/2009   Method used:   Electronically to        [facility] [phone]* (retail)       [address]       [place], [place]  [address]       Ph: [phone] or [phone]       Fax: [phone]   RxID:   [id number] VIAGRA 50 MG TABS (SILDENAFIL CITRATE) take as directed  #15 x 3   Entered and Authorized by:   Judithann Sheen MD   Signed by:   Judithann Sheen MD on 01/18/2009   Method used:   Faxed to ...       [facility]* (mail-order)             ,          Ph: [phone]       Fax: [phone]   RxID:   [id number] METOPROLOL  SUCCINATE 50 MG XR24H-TAB (METOPROLOL SUCCINATE) Take one tablet by mouth daily  #90 x 3   Entered and Authorized by:   Judithann Sheen MD   Signed by:   Judithann Sheen MD on 01/18/2009   Method used:   Faxed to ...       [facility]* (mail-order)             ,          Ph: [phone]       Fax: [phone]   RxID:   [id number] PANTOPRAZOLE SODIUM 40 MG TBEC (PANTOPRAZOLE SODIUM) once daily  #90 x 3   Entered and Authorized by:   Judithann Sheen MD   Signed by:   Judithann Sheen MD on 01/18/2009   Method used:   Faxed to ...       [facility]* (mail-order)             ,          Ph: [phone]       Fax: [phone]   RxID:   [id number] LISINOPRIL 10 MG  TABS (LISINOPRIL) 1/2 tab once daily  #90 x 3   Entered and Authorized by:   Judithann Sheen MD   Signed by:   Judithann Sheen MD on 01/18/2009   Method used:   Faxed to ...       [facility]* (mail-order)             ,          Ph: [phone]       Fax: [phone]   RxID:   [id number] KLOR-CON M20 20 MEQ  TBCR (POTASSIUM CHLORIDE CRYS CR) once daily  #90 x 3   Entered and Authorized by:   Judithann Sheen MD   Signed by:   Judithann Sheen MD on 01/18/2009   Method used:   Faxed to ...       [facility]* (mail-order)             ,          Ph: [phone]       Fax: [phone]   RxID:   [id number] SIMVASTATIN 40 MG  TABS (SIMVASTATIN) once daily at hs  #90 x 3   Entered and Authorized by:   Judithann Sheen MD   Signed by:   Judithann Sheen MD on 01/18/2009   Method used:   Faxed to ...       [facility]* (mail-order)             ,          Ph: [phone]       Fax: [phone]   RxID:   [id number] VIAGRA 50 MG TABS (SILDENAFIL CITRATE) take as directed  #15 x 3   Entered and Authorized by:   Judithann Sheen MD   Signed by:   Judithann Sheen MD on 01/18/2009   Method used:   Electronically to        [facility]* (mail-order)             ,          Ph: [phone]       Fax: [phone]   RxID:   [id number] METOPROLOL SUCCINATE 50 MG XR24H-TAB (METOPROLOL SUCCINATE) Take one tablet by mouth daily  #90  x 3   Entered and Authorized by:   Judithann Sheen MD   Signed by:   Judithann Sheen MD on 01/18/2009   Method used:   Electronically to        [facility]* (mail-order)             ,          Ph: [phone]       Fax: [phone]   RxID:   [id number] PANTOPRAZOLE SODIUM 40 MG TBEC (PANTOPRAZOLE SODIUM) once daily  #90 x 3   Entered and Authorized by:   Judithann Sheen MD   Signed by:   Judithann Sheen MD on 01/18/2009   Method used:   Electronically to        [facility]* (mail-order)             ,          Ph: [phone]       Fax: [phone]   RxID:   [id number] LISINOPRIL 10 MG  TABS (LISINOPRIL) 1/2 tab once daily  #90 x 3   Entered  and Authorized by:   Judithann Sheen MD   Signed by:   Judithann Sheen MD on 01/18/2009   Method used:   Electronically to        [facility]* (mail-order)             ,          Ph: [phone]       Fax: [phone]   RxID:   [id number] KLOR-CON M20 20 MEQ  TBCR (POTASSIUM CHLORIDE CRYS CR) once daily  #90 x 3   Entered and Authorized by:   Judithann Sheen MD   Signed by:   Judithann Sheen MD on 01/18/2009   Method used:   Electronically to        [facility]* (mail-order)             ,          Ph: [phone]       Fax: [phone]   RxID:   [id number] SIMVASTATIN 40 MG  TABS (SIMVASTATIN) once daily at hs  #90 x 3   Entered and Authorized by:   Judithann Sheen MD   Signed by:   Judithann Sheen MD on 01/18/2009   Method used:   Electronically to        [facility]* (mail-order)             ,          Ph: [phone]       Fax: [phone]   RxID:   [id number]     Immunization History:  Pneumovax Immunization History:    Pneumovax:  pneumovax (01/28/2006)  Influenza Immunization History:    Influenza:  historical (01/18/2009)  Tetanus/Td Immunization History:    Tetanus/Td:  td (02/17/2002)   Laboratory Results   Urine Tests    Routine Urinalysis   Color: yellow Appearance: Clear Glucose: negative   (Normal Range: Negative) Bilirubin: negative   (Normal Range: Negative) Ketone: negative   (Normal Range: Negative) Spec. Gravity: 1.015   (Normal Range: 1.003-1.035) Blood: negative   (Normal Range: Negative) pH: 5.0   (Normal Range: 5.0-8.0) Protein: negative   (Normal Range: Negative) Urobilinogen: 0.2   (Normal Range: 0-1) Nitrite: negative   (Normal Range: Negative) Leukocyte Esterace: negative   (  Normal Range: Negative)    Comments: Rita Ohara  January 18, 2009 10:57 AM

## 2010-03-19 NOTE — Assessment & Plan Note (Signed)
Summary: knee pain/per Dr. Marlon Pel   Vital Signs:  Patient profile:   75 year old male Weight:      242 pounds BMI:     32.94 O2 Sat:      97 % Temp:     97.4 degrees F Pulse rate:   70 / minute BP sitting:   132 / 82  (left arm) Cuff size:   large  Vitals Entered By: Pura Spice, RN (December 20, 2008 3:52 PM) CC: fell off ladder and rt knee painful    History of Present Illness: this 75 year old white male fell off a ladder approximately 2 weeks ago injuring the right knee and the knee has been painful since that time.he used ice initially as well as taking Tylenol for discomfort. He has been encouraged by his wife and myself to come in for examination and treatment. Blood pressure has been well controlled as well as his GERD which is controlled with Prilosec  Allergies (verified): No Known Drug Allergies  Past History:  Past Medical History: Last updated: 07/11/2008 CARDIOMYOPATHY, ISCHEMIC   EF 45% ICD - IN SITU (ICD-V45.02) CAD, ARTERY BYPASS GRAFT (ICD-414.04) HYPERLIPIDEMIA (ICD-272.4) ARTHRITIS, SHOULDERS, BILATERAL (ICD-716.91) UTI (ICD-599.0) OSTEOPENIA (ICD-733.90) HYPOTHYROIDISM (ICD-244.9) UNSPECIFIED ANEMIA (ICD-285.9) UNS ADVRS EFF OTH RX MEDICINAL&BIOLOGICAL SBSTNC (ICD-995.29) SPECIAL SCREENING MALIGNANT NEOPLASM OF PROSTATE (ICD-V76.44)    Past Surgical History: Last updated: 07/10/2008 CABG 1996 ICD  St. Jude current VRRF - 07/2006  Social History: Last updated: 07/10/2008 Full Time Married  Tobacco Use - No.  Alcohol Use - yes - occasional Drug Use - no  Risk Factors: Smoking Status: never (07/10/2008)  Review of Systems  The patient denies anorexia, fever, weight loss, weight gain, vision loss, decreased hearing, hoarseness, chest pain, syncope, dyspnea on exertion, peripheral edema, prolonged cough, headaches, hemoptysis, abdominal pain, melena, hematochezia, severe indigestion/heartburn, hematuria, incontinence, genital sores,  muscle weakness, suspicious skin lesions, transient blindness, difficulty walking, depression, unusual weight change, abnormal bleeding, enlarged lymph nodes, angioedema, breast masses, and testicular masses.    Physical Exam  General:  Well-developed,well-nourished,in no acute distress; alert,appropriate and cooperative throughout examinationoverweight-appearing.   Chest Wall:  pacemaker left chest Lungs:  Normal respiratory effort, chest expands symmetrically. Lungs are clear to auscultation, no crackles or wheezes. Heart:  systolic click of precordium from Jude valve Msk:  right knee swollen and generalized tenderness Extremities:  No clubbing, cyanosis, edema, or deformity noted with normal full range of motion of all joints.     Impression & Recommendations:  Problem # 1:  KNEE SPRAIN, RIGHT (ICD-844.9) Assessment New sulindac 2 mg b.i.d. Neoprene sleeve for right knee  Problem # 2:  CARDIOMYOPATHY, ISCHEMIC  EF 40-45% (ICD-414.8) Assessment: Improved  His updated medication list for this problem includes:    [facility]vs Aspirin Ec 325 Mg Tbec (Aspirin) ..... Once daily    Lisinopril 10 Mg Tabs (Lisinopril) .[name]... 1/2 tab once daily    Metoprolol Succinate 50 Mg Xr24h-tab (Metoprolol succinate) .[name]... Take one tablet by mouth daily  Problem # 3:  CAD, ARTERY BYPASS GRAFT (ICD-414.04) Assessment: Improved  His updated medication list for this problem includes:    [facility] Aspirin Ec 325 Mg Tbec (Aspirin) ..... Once daily    Lisinopril 10 Mg Tabs (Lisinopril) .[name]... 1/2 tab once daily    Metoprolol Succinate 50 Mg Xr24h-tab (Metoprolol succinate) .[name]... Take one tablet by mouth daily  Complete Medication List: 1)  Simvastatin 40 Mg Tabs (Simvastatin) .... Once daily at hs 2)  [facility] Aspirin  Ec 325 Mg Tbec (Aspirin) .... Once daily 3)  Glucosamine-chondroitin 1500-1200 Mg/68ml Liqd (Glucosamine-chondroitin) .... Two times a day 4)  Bl Vitamin B-6 100 Mg Tabs (Pyridoxine hcl) .... Two two times a  day 5)  Klor-con M20 20 Meq Tbcr (Potassium chloride crys cr) .... Once daily 6)  Lisinopril 10 Mg Tabs (Lisinopril) .... 1/2 tab once daily 7)  Pantoprazole Sodium 40 Mg Tbec (Pantoprazole sodium) .... Once daily 8)  Metoprolol Succinate 50 Mg Xr24h-tab (Metoprolol succinate) .... Take one tablet by mouth daily 9)  Viagra 50 Mg Tabs (Sildenafil citrate) .... Take as directed 10)  Centrum Silver Tabs (Multiple vitamins-minerals) .... Take 1 by mouth once daily 11)  Fish Oil 1000 Mg Caps (Omega-3 fatty acids) .... Take 1 by mouth once daily 12)  Sulindac Powd (Sulindac) .... One b.i.d. p.c.  Patient Instructions: 1)  Please move up physical freom December 2nd, either Tuesday or Thusday 2)  Strained Knee, get Neoprene knee sling at [employer]  store and wear during 3)  day 4)  tkae clinoril 200mg  two times a day 5)  continue other medications Prescriptions: SULINDAC  POWD (SULINDAC) one b.i.d. p.c.  #60 x 11   Entered and Authorized by:   Judithann Sheen MD   Signed by:   Judithann Sheen MD on 01/15/2009   Method used:   Print then Give to Patient   RxID:   [id number]

## 2010-03-19 NOTE — Letter (Signed)
Summary: Device-Delinquent Phone Journalist, newspaper, Main Office  [address]   [place], [place] [address]   Phone: [phone]  Fax: [phone]     April 13, 2009 MRN: [id number]   MOUSA PROUT [address] [place], [place]  [address]   Dear Mr. Liby,  According to our records, you were scheduled for a device phone transmission on  April 09, 2009.     We did not receive any results from this check.  If you transmitted on your scheduled day, please call us to help troubleshoot your system.  If you forgot to send your transmission, please send one upon receipt of this letter.  Thank you,   [facility]

## 2010-03-19 NOTE — Letter (Signed)
Summary: Remote Device Check  [employer], Main Office  [address]   [place], [place] [address]   Phone: [phone]  Fax: [phone]     May 01, 2009 MRN: [id number]   [name] [address] [place], [place]  [address]   Dear [name],   Your remote transmission was recieved and reviewed by your physician.  All diagnostics were within normal limits for you.    __X____Your next office visit is scheduled for:   MAY 2011 WITH DR Graciela Husbands. Please call our office to schedule an appointment.    Sincerely,  Proofreader

## 2010-03-19 NOTE — Assessment & Plan Note (Signed)
Summary: COUGH/CONGESTION/CCM   Vital Signs:  Patient Profile:   75 Years Old Male Weight:      221 pounds Temp:     98.7 degrees F Pulse rate:   68 / minute BP sitting:   106 / 62  Vitals Entered By: Sindy Guadeloupe RN (December 04, 2006 11:40 AM)                 Chief Complaint:  cough-green-yellow phlegm.  History of Present Illness: 2 days of ST, PND, and dry cough. No fever. Has a hx of recurrent pneumonias, and he was told to get an antibiotic any time he gets sick. On fluids and Tylenol.  Current Allergies (reviewed today): No known allergies       Physical Exam  General:     Well-developed,well-nourished,in no acute distress; alert,appropriate and cooperative throughout examination Head:     Normocephalic and atraumatic without obvious abnormalities. No apparent alopecia or balding. Eyes:     No corneal or conjunctival inflammation noted. EOMI. Perrla. Funduscopic exam benign, without hemorrhages, exudates or papilledema. Vision grossly normal. Ears:     External ear exam shows no significant lesions or deformities.  Otoscopic examination reveals clear canals, tympanic membranes are intact bilaterally without bulging, retraction, inflammation or discharge. Hearing is grossly normal bilaterally. Nose:     External nasal examination shows no deformity or inflammation. Nasal mucosa are pink and moist without lesions or exudates. Mouth:     Oral mucosa and oropharynx without lesions or exudates.  Teeth in good repair. Neck:     No deformities, masses, or tenderness noted. Lungs:     Normal respiratory effort, chest expands symmetrically. Lungs are clear to auscultation, no crackles or wheezes.    Impression & Recommendations:  Problem # 1:  URI (ICD-465.9)  His updated medication list for this problem includes:    [facility] Aspirin Ec 325 Mg Tbec (Aspirin) ..... Once daily   Complete Medication List: 1)  Simvastatin 40 Mg Tabs (Simvastatin) .... Once  daily 2)  [facility] Aspirin Ec 325 Mg Tbec (Aspirin) .... Once daily 3)  Glucosamine-chondroitin 1500-1200 Mg/28ml Liqd (Glucosamine-chondroitin) .... Two times a day 4)  Bl Vitamin B-6 100 Mg Tabs (Pyridoxine hcl) .... Two two times a day 5)  Nexium 40 Mg Cpdr (Esomeprazole magnesium) .... Once daily 6)  Klor-con M20 20 Meq Tbcr (Potassium chloride crys cr) .... Once daily 7)  Metoprolol Tartrate 100 Mg Tabs (Metoprolol tartrate) .... 1/2 tab once daily 8)  Lisinopril 10 Mg Tabs (Lisinopril) .... 1/2 tab once daily 9)  Zithromax Z-pak 250 Mg Tabs (Azithromycin) .... As directed   Patient Instructions: 1)  Please schedule a follow-up appointment as needed.    Prescriptions: ZITHROMAX Z-PAK 250 MG  TABS (AZITHROMYCIN) as directed  #1 x 0   Entered and Authorized by:   Nelwyn Salisbury MD   Signed by:   Nelwyn Salisbury MD on 12/04/2006   Method used:   Electronically sent to ...       [facility] [address]       [address]       [place], [place]  [address]       Ph: [phone] or [phone]       Fax: [phone]   RxID:   [id number]  ]

## 2010-03-19 NOTE — Cardiovascular Report (Signed)
Summary: Office Visit Remote   Office Visit Remote   Imported By: Roderic Ovens 12/31/2009 15:08:38  _____________________________________________________________________  External Attachment:    Type:   Image     Comment:   External Document

## 2010-03-19 NOTE — Assessment & Plan Note (Signed)
Summary: df2   Primary Provider:  Judithann Sheen MD  CC:  defib check.  .  History of Present Illness: [name] is seen in followup for abortive cardiac arrest in the setting of modest ischemic cardiomyopathy with prior bypass surgery and an ejection fraction of 40-45%. He is status post ICD implantation He has had no recurrent events..  The patient denies SOB, chest pain,  or palpitations;  he does have some peripheral edema which is recurrent. Happens primarily when he is steady for long time. It resolves at night.  Laboratory results reviewed from December and potassium of 4.1; creatinine of 1.5. HDL was? 5   Current Medications (verified): 1)  Simvastatin 40 Mg  Tabs (Simvastatin) .... Once Daily At [facility] 2)  [facility] Aspirin Ec 325 Mg  Tbec (Aspirin) .... Once Daily 3)  Glucosamine-Chondroitin 1500-1200 Mg/13ml  Liqd (Glucosamine-Chondroitin) .... Two Times A Day 4)  Bl Vitamin B-6 100 Mg  Tabs (Pyridoxine Hcl) .... Two Two Times A Day 5)  Klor-Con M20 20 Meq  Tbcr (Potassium Chloride Crys Cr) .... Once Daily 6)  Lisinopril 10 Mg  Tabs (Lisinopril) .... 1/2 Tab Once Daily 7)  Pantoprazole Sodium 40 Mg Tbec (Pantoprazole Sodium) .... Once Daily 8)  Metoprolol Succinate 50 Mg Xr24h-Tab (Metoprolol Succinate) .... Take One Tablet By Mouth Daily 9)  Viagra 50 Mg Tabs (Sildenafil Citrate) .... Take As Directed 10)  Centrum Silver  Tabs (Multiple Vitamins-Minerals) .... Take 1 By Mouth Once Daily 11)  Fish Oil 1000 Mg Caps (Omega-3 Fatty Acids) .... Take 1 By Mouth Once Daily  Allergies (verified): No Known Drug Allergies  Past History:  Past Medical History: Last updated: 07/11/2008 CARDIOMYOPATHY, ISCHEMIC   EF 45% ICD - IN SITU (ICD-V45.02) CAD, ARTERY BYPASS GRAFT (ICD-414.04) HYPERLIPIDEMIA (ICD-272.4) ARTHRITIS, SHOULDERS, BILATERAL (ICD-716.91) UTI (ICD-599.0) OSTEOPENIA (ICD-733.90) HYPOTHYROIDISM (ICD-244.9) UNSPECIFIED ANEMIA (ICD-285.9) UNS ADVRS EFF OTH RX  MEDICINAL&BIOLOGICAL SBSTNC (ZOX-096.04) SPECIAL SCREENING MALIGNANT NEOPLASM OF PROSTATE (ICD-V76.44)    Past Surgical History: Last updated: 01/18/2009 CABG 1996 ICD  [employer] current VRRF - 07/2006 pacemaker in place  Family History: Last updated: 07/10/2008 Family History of Coronary Artery Disease:  Family History of Diabetes:   Social History: Last updated: 07/10/2008 Full Time Married  Tobacco Use - No.  Alcohol Use - yes - occasional Drug Use - no  Vital Signs:  Patient profile:   75 year old male Height:      72 inches Weight:      238 pounds BMI:     32.40 Pulse rate:   62 / minute Pulse rhythm:   regular BP sitting:   122 / 66  (left arm) Cuff size:   regular  Vitals Entered By: Judithe Modest CMA (Jun 19, 2009 9:09 AM)  Physical Exam  General:  The patient was alert and oriented in no acute distress. HEENT Normal.  Neck veins were flat, carotids were brisk.  Lungs were clear.  Heart sounds were regular without murmurs or gallops.  Abdomen was soft with active bowel sounds. There is no clubbing cyanosis ; trace edema Skin Warm and dry     ICD Specifications Following MD:  Sherryl Manges, MD     ICD Vendor:  [employer]     ICD Model Number:  [id number]     ICD Serial Number:  [id number] ICD DOI:  08/11/2006     ICD Implanting MD:  Sherryl Manges, MD  Lead 1:    Location: RV  DOI: 08/11/2006     Model #: [id number]     Serial #: [id number]     Status: active  Indications::  VF ARREST   ICD Follow Up Remote Check?  No Battery Voltage:  3.16 V     Charge Time:  10.6 seconds     Battery Est. Longevity:  6.1 years Underlying rhythm:  SR ICD Dependent:  No       ICD Device Measurements Right Ventricle:  Amplitude: 8.6 mV, Impedance: 500 ohms, Threshold: 1.0 V at 0.4 msec Shock Impedance: 47 ohms   Episodes Coumadin:  No Shock:  0     ATP:  0     Nonsustained:  0     Ventricular Pacing:  <1%  Brady Parameters Mode VVI     Lower Rate Limit:  40      Tachy  Zones VF:  240     VT:  200     VT1:  176     Next Remote Date:  09/20/2009     Next Cardiology Appt Due:  06/18/2010 Tech Comments:  No parameter changes.  Device function normal.  Merlin transmissiosns every 3 months.  ROV 1 year with Dr Graciela Husbands. Altha Harm, LPN  Jun 20, 4694 9:21 AM   Impression & Recommendations:  Problem # 1:  CAD, ARTERY BYPASS GRAFT (ICD-414.04) Assessment Deteriorated patient is stable. I will need to review all the labs is the HDL is recorded as 5. he is on statin therapy    His updated medication list for this problem includes:    [facility]vs Aspirin Ec 325 Mg Tbec (Aspirin) ..... Once daily    Lisinopril 10 Mg Tabs (Lisinopril) .[name]... 1/2 tab once daily    Metoprolol Succinate 50 Mg Xr24h-tab (Metoprolol succinate) .[name]... Take one tablet by mouth daily  Problem # 2:  ICD -SJ (ICD-V45.02) Device parameters and data were reviewed and no changes were made  Problem # 3:  CARDIAC ARREST (ICD-427.5) no recurrent arrhtyhmia His updated medication list for this problem includes:    [facility]vs Aspirin Ec 325 Mg Tbec (Aspirin) ..... Once daily    Lisinopril 10 Mg Tabs (Lisinopril) .[name]... 1/2 tab once daily    Metoprolol Succinate 50 Mg Xr24h-tab (Metoprolol succinate) .[name]... Take one tablet by mouth daily  Problem # 4:  EDEMA (ICD-782.3) mild edema,  we have discussed decreasing salt intake and would like to avoid adding a diuretci as he is on postassium supplementation in the absence of ciuretic therapy.    Problem # 5:  HYPERLIPIDEMIA (ICD-272.4) The HDL is recorded as 5. We will need to go back and review old records to see whether this is a artifact or renal.  for now we'll continue his simvastatin His updated medication list for this problem includes:    Simvastatin 40 Mg Tabs (Simvastatin) ..... Once daily at hs

## 2010-03-19 NOTE — Cardiovascular Report (Signed)
Summary: Office Visit Remote   Office Visit Remote   Imported By: Roderic Ovens 05/02/2009 11:20:08  _____________________________________________________________________  External Attachment:    Type:   Image     Comment:   External Document

## 2010-03-20 LAB — VITAMIN D 25 HYDROXY (VIT D DEFICIENCY, FRACTURES): Vit D, 25-Hydroxy: 43 ng/mL (ref 30–89)

## 2010-03-21 ENCOUNTER — Encounter: Payer: Self-pay | Admitting: Internal Medicine

## 2010-03-21 ENCOUNTER — Encounter (INDEPENDENT_AMBULATORY_CARE_PROVIDER_SITE_OTHER): Payer: Medicare Other

## 2010-03-21 ENCOUNTER — Ambulatory Visit: Admit: 2010-03-21 | Payer: Self-pay | Admitting: Internal Medicine

## 2010-03-21 DIAGNOSIS — Z95810 Presence of automatic (implantable) cardiac defibrillator: Secondary | ICD-10-CM

## 2010-03-21 DIAGNOSIS — I469 Cardiac arrest, cause unspecified: Secondary | ICD-10-CM

## 2010-03-21 DIAGNOSIS — I2589 Other forms of chronic ischemic heart disease: Secondary | ICD-10-CM

## 2010-03-27 NOTE — Assessment & Plan Note (Signed)
Summary: [facility]   Primary Provider:  Judithann Sheen MD   History of Present Illness:   [name] is seen in followup for abortive cardiac arrest in the setting of modest ischemic cardiomyopathy with prior bypass surgery and an ejection fraction of 40-45%. He is status post ICD implantation He has had no recurrent events.  He has remote syncope.  The patient denies SOB, chest pain,  or palpitations;  he does have some peripheral edema which is recurrent. Happens primarily when he is steady for long time. It resolves at night.  Laboratory results reviewed from December and potassium of 4.1; creatinine of 1.5. HDL was? 5   Allergies: No Known Drug Allergies  Physical Exam  General:  The patient was alert and oriented in no acute distress. HEENT Normal.  Neck veins were flat, carotids were brisk.  Lungs were clear.  Heart sounds were regular without murmurs or gallops.  Abdomen was soft with active bowel sounds. There is no clubbing cyanosis or edema. Skin Warm and dry     ICD Specifications Following MD:  Sherryl Manges, MD     ICD Vendor:  [employer]     ICD Model Number:  [id number]     ICD Serial Number:  [id number] ICD DOI:  08/11/2006     ICD Implanting MD:  Sherryl Manges, MD  Lead 1:    Location: RV     DOI: 08/11/2006     Model #: [id number]     Serial #: [id number]     Status: active  Indications::  VF ARREST   ICD Follow Up ICD Dependent:  No       ICD Device Measurements Right Ventricle:  Amplitude: 10 mV, Impedance: 550 ohms, Threshold: .75` V at /.4 msec  Episodes MS Episodes:  0     Coumadin:  No  Brady Parameters Mode VVI     Lower Rate Limit:  40      Tachy Zones VF:  240     VT:  200     VT1:  176     MD Comments:  normal functoin  Impression & Recommendations:  Problem # 1:  CARDIAC ARREST (ICD-427.5) no recurrent ventricular arrhythmias His updated medication list for this problem includes:    [facility]vs Aspirin Ec 325 Mg Tbec (Aspirin) ..... Once  daily    Lisinopril 10 Mg Tabs (Lisinopril) .[name]... 1/2 tab once daily    Metoprolol Succinate 50 Mg Xr24h-tab (Metoprolol succinate) .[name]... Take one tablet by mouth daily  Problem # 2:  ICD -SJ (ICD-V45.02) Device parameters and data were reviewed and no changes were made  Problem # 3:  CARDIOMYOPATHY, ISCHEMIC  EF 40-45% (ICD-414.8) stable on current medications. It may do with at his next visit rechecking his echo to look at his left ventricular function and to see if there is any value for Aldactone His updated medication list for this problem includes:    [facility]vs Aspirin Ec 325 Mg Tbec (Aspirin) ..... Once daily    Lisinopril 10 Mg Tabs (Lisinopril) .[name]... 1/2 tab once daily    Metoprolol Succinate 50 Mg Xr24h-tab (Metoprolol succinate) .[name]... Take one tablet by mouth daily

## 2010-03-27 NOTE — Assessment & Plan Note (Signed)
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 Graciela Husbands cardiologist He relates he has a vascular-appearing rash over the left lower leg which have been there for some time with no diagnosis His major complaints consist of some painful feet with early bunions which do not need surgery at this time. He also states he has a Morton's neuroma Request that his medications be refilled Had Zostavax 2 yrs ago  Preventive Screening-Counseling & Management  Alcohol-Tobacco     Smoking Status: quit > 6 months     Packs/Day: 1.0     Year Started: 1955     Year Quit: 1970  Caffeine-Diet-Exercise     Does Patient Exercise: no  Current Medications (verified): 1)  Simvastatin 40 Mg  Tabs (Simvastatin) .... [facility] Daily At [facility] 2)  [facility] Aspirin Ec 325 Mg  Tbec (Aspirin) .... Once Daily 3)  Glucosamine-Chondroitin 1500-1200 Mg/73ml  Liqd (Glucosamine-Chondroitin) .... Two Times A Day 4)  Bl Vitamin B-6 100 Mg  Tabs (Pyridoxine Hcl) .... Two Two Times A Day 5)  Klor-Con M20 20 Meq  Tbcr (Potassium Chloride Crys Cr) .... Once Daily 6)  Lisinopril 10 Mg  Tabs (Lisinopril) .... 1/2 Tab Once Daily 7)  Pantoprazole Sodium 40 Mg Tbec (Pantoprazole Sodium) .... Once Daily 8)  Metoprolol Succinate 50 Mg  Xr24h-Tab (Metoprolol Succinate) .... Take One Tablet By Mouth Daily 9)  Viagra 100 Mg Tabs (Sildenafil Citrate) 10)  Centrum Silver  Tabs (Multiple Vitamins-Minerals) .... Take 1 By Mouth Once Daily 11)  Fish Oil 1000 Mg Caps (Omega-3 Fatty Acids) .... Take 1 By Mouth Once Daily 12)  Opc3 13)  Vitamin D 400 Unit Tabs (Cholecalciferol) 14)  Digestive Enzymes  Tabs (Digestive Enzymes)  Allergies (verified): No Known Drug Allergies  Past History:  Past Medical History: Last updated: 07/11/2008 CARDIOMYOPATHY, ISCHEMIC   EF 45% ICD - IN SITU (ICD-V45.02) CAD, ARTERY BYPASS GRAFT (ICD-414.04) HYPERLIPIDEMIA (ICD-272.4) ARTHRITIS, SHOULDERS, BILATERAL (ICD-716.91) UTI (ICD-599.0) OSTEOPENIA (ICD-733.90) HYPOTHYROIDISM (ICD-244.9) UNSPECIFIED ANEMIA (ICD-285.9) UNS ADVRS EFF OTH RX MEDICINAL&BIOLOGICAL SBSTNC (UEA-540.98) SPECIAL SCREENING MALIGNANT NEOPLASM OF PROSTATE (ICD-V76.44)    Past Surgical History: Last updated: 01/18/2009 CABG 1996 ICD  St. Jude current VRRF - 07/2006 pacemaker in place  Family History: Last updated: 07/10/2008 Family History of Coronary Artery Disease:  Family History of Diabetes:   Social History: Last updated: 07/10/2008 Full Time Married  Tobacco Use - No.  Alcohol Use - yes - occasional Drug Use - no  Risk Factors: Smoking Status: quit > 6 months (03/19/2010) Packs/Day: 1.0 (03/19/2010)  Review of Systems  See HPI  The patient denies anorexia, fever, weight loss, weight gain, vision loss, decreased hearing, hoarseness, chest pain, syncope, dyspnea on exertion, peripheral edema, prolonged cough, headaches, hemoptysis, abdominal pain, melena, hematochezia, severe indigestion/heartburn, hematuria, incontinence, genital sores, muscle weakness, suspicious skin lesions, transient blindness, difficulty walking, depression, unusual weight change, abnormal bleeding, enlarged lymph nodes, angioedema, breast masses, and testicular masses.     General:  See HPI; Denies chills, fatigue, fever, loss of appetite, malaise, sleep disorder, sweats, weakness, and weight loss. Eyes:  Denies blurring, discharge, double vision, eye irritation, eye pain, halos, itching, light sensitivity, red eye, vision loss-1 eye, and vision loss-both eyes. CV:  Cabg, pacemaker. Resp:  Denies chest discomfort, chest pain with inspiration, cough, coughing up blood, excessive snoring, hypersomnolence, morning headaches, pleuritic, shortness of breath, sputum productive, and wheezing. GI:  Complains of indigestion; denies abdominal pain, bloody stools, change in bowel habits, constipation, dark tarry stools, diarrhea, excessive appetite, gas, hemorrhoids, loss of appetite, nausea, vomiting, vomiting blood, and yellowish skin color; improved with protonix. GU:  Denies decreased libido, discharge, dysuria, erectile dysfunction, genital sores, hematuria, incontinence, nocturia, urinary frequency, and urinary hesitancy. MS:  Denies joint pain, joint redness, joint swelling, loss of strength, low back pain, mid back pain, muscle aches, muscle , cramps, muscle weakness, stiffness, and thoracic pain.  Physical Exam  General:  Well-developed,well-nourished,in no acute distress; alert,appropriate and cooperative throughout examination Head:  Normocephalic and atraumatic without obvious abnormalities. No apparent alopecia or balding. Eyes:  No corneal or conjunctival inflammation noted. EOMI. Perrla. Funduscopic exam benign, without hemorrhages, exudates or papilledema. Vision grossly normal. Ears:  External ear exam shows no significant lesions or deformities.  Otoscopic examination reveals clear canals, tympanic membranes are intact bilaterally without bulging, retraction, inflammation or discharge. Hearing is grossly normal bilaterally. Nose:  External nasal examination shows no deformity or inflammation. Nasal mucosa are pink and moist without lesions or exudates. Mouth:   Oral mucosa and oropharynx without lesions or exudates.  Teeth in good repair. Neck:  No deformities, masses, or tenderness noted. Chest Wall:  CABG scar Breasts:  No masses or gynecomastia noted Lungs:  Normal respiratory effort, chest expands symmetrically. Lungs are clear to auscultation, no crackles or wheezes. Heart:  Normal rate and regular rhythm. S1 and S2 normal without gallop, murmur, click, rub or other extra sounds. Abdomen:  Bowel sounds positive,abdomen soft and non-tender without masses, organomegaly or hernias noted. Rectal:  No external abnormalities noted. Normal sphincter tone. No rectal masses or tenderness. Genitalia:  Testes bilaterally descended without nodularity, tenderness or masses. No scrotal masses or lesions. No penis lesions or urethral discharge. Prostate:  Prostate gland firm and smooth, no enlargement, nodularity, tenderness, mass, asymmetry or induration. Msk:  No deformity or scoliosis noted of thoracic or lumbar spine.   Pulses:  R and L carotid,radial,femoral,dorsalis pedis and posterior tibial pulses are full and equal bilaterally Extremities:  No clubbing, cyanosis, edema, or deformity noted with normal full range of motion of all joints.   Neurologic:  No cranial nerve deficits noted. Station and gait are normal. Plantar reflexes are down-going bilaterally. DTRs are symmetrical throughout. Sensory, motor and coordinative functions appear intact. Skin:  Intact without suspicious lesions or rashes Cervical Nodes:  No lymphadenopathy noted Axillary Nodes:  No palpable lymphadenopathy Inguinal Nodes:  No significant adenopathy Psych:  Cognition and judgment appear intact. Alert and cooperative with normal attention span and concentration. No apparent delusions, illusions, hallucinations   Impression & Recommendations:  Problem # 1:  GERD (  ICD-530.81) Assessment Improved  His updated medication list for this problem includes:    Pantoprazole Sodium 40 Mg  Tbec (Pantoprazole sodium) ..... Once daily  Problem # 2:  CARDIAC ARREST (ICD-427.5) Assessment: Improved  His updated medication list for this problem includes:    [facility]vs Aspirin Ec 325 Mg Tbec (Aspirin) ..... Once daily    Metoprolol Succinate 50 Mg Xr24h-tab (Metoprolol succinate) .[name]... Take one tablet by mouth daily  Problem # 3:  ERECTILE DYSFUNCTION, NON-ORGANIC, MILD (ICD-302.72) Assessment: New  His updated medication list for this problem includes:    Viagra 100 Mg Tabs (Sildenafil citrate) .[name]... 1 as directed  Orders: Prescription Created Electronically [phone])  Problem # 4:  GERD (ICD-530.81) Assessment: Improved  His updated medication list for this problem includes:    Pantoprazole Sodium 40 Mg Tbec (Pantoprazole sodium) ..... Once daily  Problem # 5:  CARDIOMYOPATHY, ISCHEMIC  EF 40-45% (ICD-414.8) Assessment: Improved  His updated medication list for this problem includes:    [facility]vs Aspirin Ec 325 Mg Tbec (Aspirin) ..... Once daily    Lisinopril 10 Mg Tabs (Lisinopril) .[name]... 1/2 tab once daily    Metoprolol Succinate 50 Mg Xr24h-tab (Metoprolol succinate) .[name]... Take one tablet by mouth daily  Orders: TLB-BMP (Basic Metabolic Panel-BMET) (80048-METABOL)  Problem # 6:  HYPERLIPIDEMIA (ICD-272.4) Assessment: Improved  His updated medication list for this problem includes:    Simvastatin 40 Mg Tabs (Simvastatin) ..... Once daily at hs  Orders: TLB-Lipid Panel (80061-LIPID) TLB-Hepatic/Liver Function Pnl (80076-HEPATIC)  Problem # 7:  ARTHRITIS, SHOULDERS, BILATERAL (ICD-716.91) Assessment: Unchanged  Complete Medication List: 1)  Simvastatin 40 Mg Tabs (Simvastatin) .... Once daily at hs 2)  [facility]vs Aspirin Ec 325 Mg Tbec (Aspirin) .... Once daily 3)  Glucosamine-chondroitin 1500-1200 Mg/20ml Liqd (Glucosamine-chondroitin) .... Two times a day 4)  Bl Vitamin B-6 100 Mg Tabs (Pyridoxine hcl) .... Two two times a day 5)  Klor-con M20 20 Meq Tbcr (Potassium  chloride crys cr) .... Once daily 6)  Lisinopril 10 Mg Tabs (Lisinopril) .... 1/2 tab once daily 7)  Pantoprazole Sodium 40 Mg Tbec (Pantoprazole sodium) .... Once daily 8)  Metoprolol Succinate 50 Mg Xr24h-tab (Metoprolol succinate) .... Take one tablet by mouth daily 9)  Viagra 100 Mg Tabs (Sildenafil citrate) .[name].. 1 as directed 10)  Centrum Silver Tabs (Multiple vitamins-minerals) .... Take 1 by mouth once daily 11)  Fish Oil 1000 Mg Caps (Omega-3 fatty acids) .... Take 1 by mouth once daily 12)  Opc3  13)  Vitamin D 400 Unit Tabs (Cholecalciferol) 14)  Digestive Enzymes Tabs (Digestive enzymes)  Other Orders: Venipuncture (96295) TLB-CBC Platelet - w/Differential (85025-CBCD) TLB-TSH (Thyroid Stimulating Hormone) (84443-TSH) TLB-PSA (Prostate Specific Antigen) (84153-PSA) T-Vitamin D (25-Hydroxy) (28413-24401) UA Dipstick w/o Micro (manual) (02725) Specimen Handling (99000)  Patient Instructions: 1)  continued on birth 2)  Continue her Pulmo-Aide Dr. Graciela Husbands regarding your pacemaker and cardiology followup 3)  Have refilled her prescriptionWe'll call you regarding your lab studies Prescriptions: VIAGRA 100 MG TABS (SILDENAFIL CITRATE) 1 as directed  #8 x 11   Entered and Authorized by:   Judithann Sheen MD   Signed by:   Judithann Sheen MD on 03/19/2010   Method used:   Electronically to        [facility] [phone]* (retail)       [address]       [place], [place]  [address]       Ph: [phone] or [phone]  Fax: [phone]   RxID:   [id number] METOPROLOL SUCCINATE 50 MG XR24H-TAB (METOPROLOL SUCCINATE) Take one tablet by mouth daily  #90 x 3   Entered and Authorized by:   Judithann Sheen MD   Signed by:   Judithann Sheen MD on 03/19/2010   Method used:   Electronically to        [facility] [phone]* (retail)       [address]       [place], [place]  [address]       Ph: [phone] or [phone]       Fax: [phone]   RxID:    [id number] PANTOPRAZOLE SODIUM 40 MG TBEC (PANTOPRAZOLE SODIUM) once daily  #90 x 3   Entered and Authorized by:   Judithann Sheen MD   Signed by:   Judithann Sheen MD on 03/19/2010   Method used:   Electronically to        [facility] [phone]* (retail)       [address]       [place], [place]  [address]       Ph: [phone] or [phone]       Fax: [phone]   RxID:   [id number] LISINOPRIL 10 MG  TABS (LISINOPRIL) 1/2 tab once daily  #90 x 3   Entered and Authorized by:   Judithann Sheen MD   Signed by:   Judithann Sheen MD on 03/19/2010   Method used:   Electronically to        [facility] [phone]* (retail)       [address]       [place], [place]  [address]       Ph: [phone] or [phone]       Fax: [phone]   RxID:   [id number] KLOR-CON M20 20 MEQ  TBCR (POTASSIUM CHLORIDE CRYS CR) once daily  #90 x 3   Entered and Authorized by:   Judithann Sheen MD   Signed by:   Judithann Sheen MD on 03/19/2010   Method used:   Electronically to        [facility] [phone]* (retail)       [address]       [place], [place]  [address]       Ph: [phone] or [phone]       Fax: [phone]   RxID:   [id number] SIMVASTATIN 40 MG  TABS (SIMVASTATIN) once daily at hs  #90 x 3   Entered and Authorized by:   Judithann Sheen MD   Signed by:   Judithann Sheen MD on 03/19/2010   Method used:   Electronically to        [facility] [phone]* (retail)       [address]       [place], [place]  [address]       Ph: [phone] or [phone]       Fax: [phone]   RxID:   [id number]    Orders Added: 1)  Venipuncture [77412] 2)  TLB-Lipid Panel [80061-LIPID] 3)  TLB-BMP (Basic Metabolic Panel-BMET) [80048-METABOL] 4)  TLB-CBC Platelet - w/Differential [85025-CBCD] 5)  TLB-Hepatic/Liver Function Pnl [80076-HEPATIC] 6)  TLB-TSH (Thyroid Stimulating Hormone) [84443-TSH] 7)  TLB-PSA (Prostate Specific Antigen)  [84153-PSA] 8)  T-Vitamin D (25-Hydroxy) [87867-67209] 9)  UA Dipstick w/o Micro (manual) [81002] 10)  Specimen Handling [  99000] 11)  Prescription Created Electronically [G8553] 12)  Est. Patient Level IV [25366]     Laboratory Results   Urine Tests    Routine Urinalysis   Color: yellow Appearance: Clear Glucose: negative   (Normal Range: Negative) Bilirubin: negative   (Normal Range: Negative) Ketone: negative   (Normal Range: Negative) Spec. Gravity: 1.020   (Normal Range: 1.003-1.035) Blood: negative   (Normal Range: Negative) pH: 5.0   (Normal Range: 5.0-8.0) Protein: negative   (Normal Range: Negative) Urobilinogen: 0.2   (Normal Range: 0-1) Nitrite: negative   (Normal Range: Negative) Leukocyte Esterace: negative   (Normal Range: Negative)    Comments: Rita Ohara  March 19, 2010 2:49 PM

## 2010-04-04 NOTE — Cardiovascular Report (Signed)
Summary: Office Visit   Office Visit   Imported By: Roderic Ovens 03/26/2010 15:56:26  _____________________________________________________________________  External Attachment:    Type:   Image     Comment:   External Document

## 2010-06-20 ENCOUNTER — Ambulatory Visit (INDEPENDENT_AMBULATORY_CARE_PROVIDER_SITE_OTHER): Payer: Medicare Other | Admitting: *Deleted

## 2010-06-20 ENCOUNTER — Other Ambulatory Visit: Payer: Self-pay

## 2010-06-20 DIAGNOSIS — I469 Cardiac arrest, cause unspecified: Secondary | ICD-10-CM

## 2010-06-26 ENCOUNTER — Encounter: Payer: Self-pay | Admitting: *Deleted

## 2010-06-27 NOTE — Progress Notes (Signed)
icd remote check  

## 2010-07-02 NOTE — Op Note (Signed)
NAMEUMER, HARIG NO.:  [id number]   MEDICAL RECORD NO.:  [id number]          PATIENT TYPE:  INP   LOCATION:  2916                         FACILITY:  [facility]   PHYSICIAN:  Doylene Canning. Ladona Ridgel, MD    DATE OF BIRTH:  1935-03-28   DATE OF PROCEDURE:  08/11/2006  DATE OF DISCHARGE:                               OPERATIVE REPORT   PROCEDURE PERFORMED:  Implantation of a single chamber implantable  cardioverter-defibrillator.   INDICATIONS:  Out of hospital, VF arrest status post successful  resuscitation with mild to moderate LV dysfunction, EF 40 to 45%.   INTRODUCTION:  The patient is a 75 year old man who sustained out of  hospital, VF arrest for which he was successfully resuscitated.  He had  patent grafts and an EF of 40-45%.  His encephalopathy has resolved and  he is now referred for ICD implantation ( AVID)   PROCEDURE:  After informed consent was obtained, the patient was taken  diagnostic EP lab in fasting state.  After usual preparation and  draping, intravenous fentanyl and Midazolam was given for sedation.  30  mL lidocaine was infiltrated in the left infraclavicular region.  A 7 cm  incision was carried out over this region.  Electrocautery was utilized  to dissect down to the fascial plane.  The left subclavian vein was  punctured and the St. Jude model 7121 active fixation defibrillation  lead serial number [id number] was advanced into the right ventricle and  onto the RV septum where the R-waves measured 11 mV and with the lead  actively fixed.  The pacing impedance was 625 ohms and threshold 0.9  volts at 0.5 milliseconds.  There was a very large injury current noted.  With these satisfactory parameters the lead was secured to subpectoralis  fascia with a figure-of-eight silk suture and the sewing sleeve was also  secured with silk suture.  Electrocautery was utilized to make  subcutaneous pocket.  Kanamycin irrigation was utilized to irrigate the  pocket.  Electrocautery was utilized to assure hemostasis.  The St. Jude  current VRRF single chamber ICD serial number [id number] was connected to  the defibrillation lead and placed back in the subcutaneous pocket and  secured with silk suture.  Additional kanamycin was utilized to irrigate  the pocket.  Defibrillation threshold testing carried out.   After the patient was more deeply sedated with fentanyl and Versed, VF  was induced with a T-wave shock.  A 15 joules shock was subsequent  delivered which terminated VF and restored sinus rhythm.  5 minutes was  allowed to elapse and second defibrillation threshold test carried out.  Again VF was induced with T-wave shock and this time a 15 joules shock  was delivered which failed to terminate VF.  A second 25 joules shock  was then delivered which terminated VF and restored sinus rhythm.  At  this point no additional defibrillation threshold testing was carried  out and the incision was closed with a layer of 2-0 Vicryl followed by  layer of 3-0 Vicryl followed by layer of 4-0 Vicryl.  Benzoin painted on  the  skin, Steri-Strips were applied and pressure dressing was placed.  The patient was returned to his room in satisfactory condition.   COMPLICATIONS:  There were no immediate procedure complications.   RESULTS:  Demonstrate successful implantation of the St. Jude single  chamber ICD in a patient with out of hospital VF arrest.      Doylene Canning. Ladona Ridgel, MD  Electronically Signed     GWT/MEDQ  D:  08/11/2006  T:  08/11/2006  Job:  [id number]   cc:   Berton Mount, MD  Ellin Saba., MD  Luis Abed, MD, [facility]

## 2010-07-02 NOTE — Assessment & Plan Note (Signed)
[facility]                            CARDIOLOGY OFFICE NOTE   DAXTIN, LEIKER                     MRN:          [id number]  DATE:01/21/2007                            DOB:          1935/08/13    Asaf Elmquist was seen in followup at the [facility] Cardiology Office  on January 21, 2007.  Mr. Brickley is a very nice 75 year old gentleman  with ischemic cardiomyopathy and aborted sudden death back in 2022-08-11 of  this year.  He underwent a catheterization at that time that  demonstrated patent bypass grafts and stable coronary anatomy with  anteroapical scar and an LVEF of 40-45%.  He ultimately underwent ICD  placement.  He has had no ICD discharges.  He is doing quite well at  this time.   From a symptomatic standpoint, Mr. Grissinger denies chest pain, dyspnea,  orthopnea, PND, or fatigue.  He has had minimal leg edema and really no  other complaints.  He has been active with light exercise, such as  walking.   CURRENT MEDICATIONS:  1. Glucosamine.  2. Potassium 40 mEq daily.  3. Aspirin 325 mg daily.  4. Metoprolol succinate 50 mg daily.  5. Lisinopril 5 mg daily.  6. Lipitor 20 mg daily.  7. Fish oil.  8. Centrum Silver.  9. Omeprazole.   ALLERGIES:  NKDA.   EXAM:  The patient is alert and oriented.  He is in no acute distress.  Weight 229, blood pressure 108/66, heart rate 71, respiratory rate 12.  HEENT:  Normal.  NECK:  Normal carotid upstrokes without bruits.  Jugular venous pressure  is normal.  LUNGS:  Clear to auscultation bilaterally.  HEART:  Regular rate and rhythm without murmurs or gallops.  ABDOMEN:  Soft and nontender.  No organomegaly.  No abdominal bruits.  EXTREMITIES:  No cyanosis, clubbing, or edema.  Peripheral pulses are 2+  and equal throughout.   ASSESSMENT:  1. Coronary artery disease status post coronary artery bypass      grafting.  The patient has no signs or symptoms of angina.      Continue current  medical therapy without changes, this includes      antiplatelet therapy with aspirin, beta blockade with metoprolol,      and Statin therapy with Lipitor.  His lipids have been followed by      Dr. Scotty Court.  2. Aborted sudden death, now status post ICD.  The patient is followed      by Dr. Graciela Husbands.  3. Ischemic cardiomyopathy with left ventricular ejection fraction 40-      45%.  Continue current therapy with metoprolol and lisinopril.  4. Dyslipidemia.  As above, most recent lipid panel from August of      this year showed a cholesterol of 99 with an HDL of 26 and an LDL      of 56.   For followup, I would like to see Mr. Staszak back in 6 months.  He is  followed in the device clinic quarterly.     Veverly Fells. Excell Seltzer, MD  Electronically Signed    MDC/MedQ  DD: 01/21/2007  DT: 01/21/2007  Job #: [id number]   cc:   Ellin Saba., MD

## 2010-07-02 NOTE — Assessment & Plan Note (Signed)
[facility] ([facility])                             PULMONARY OFFICE NOTE   BERLE, FITZ                     MRN:          [id number]  DATE:09/18/2006                            DOB:          01/21/1936    REFERRING PHYSICIAN:  Veverly Fells. Excell Seltzer, MD   I met Mr. Blankenbaker today with his wife for evaluation of his sleep  difficulties.   He was recently discharged from [facility] after having a  cardiac arrest and has since had a defibrillator placed.  He has had a  long-standing history of coronary artery disease.  His wife says that  she has noticed problems with his sleep since 1996, after his bypass  surgery.  Currently, she says that he falls asleep very easily when he  is sitting idle.  She has also seen him stop breathing while he is  asleep and he does snore loudly.  He tends to breath through his mouth  at night and he does get vivid dreams.  He also says he has been having  problems with stiffness and numbness in his arms mostly at night.  He  goes to bed between 10-11 at night.  He falls asleep fairly easily.  He  wakes up 3-4 times during the night.  He will wake up at 6:30 in the  morning but still feels tired.  He denies having any headaches in the  morning.  He denies any symptoms of restless leg syndrome.  There is no  history of sleep hallucinations, sleep paralysis, or cataplexy.  He does  not use anything to help him fall asleep at night.  He drinks 2-3 cups  of coffee in the morning.  He says he uses 1/2 caffeinated and 1/2  decaffeinated.   PAST MEDICAL HISTORY:  1. Coronary artery disease, status post myocardial infarction.  2. Ventricular fibrillation with sudden death.  3. He is status post defibrillator placement.  4. He has allergies.  5. He had coronary artery bypass surgery in 1996.   MEDICATIONS:  1. Centrum Silver.  2. Aspirin 325 mg daily.  3. Glucosamine.  4. Nexium 40 mg daily.  5. Simvastatin 40 mg  daily.  6. Diclofenac 50 mg daily.  7. Viagra as needed.  8. Klor-Con 20 mEq b.i.d.  9. Metoprolol 50 mg daily.  10.Lisinopril 5 mg daily.   He has no known drug allergies.   SOCIAL HISTORY:  He is married.  He is self employed.  He quit smoking  in 1972.  He has a glass of wine on a daily basis.   FAMILY HISTORY:  Significant for diabetes, heart disease.   REVIEW OF SYSTEMS:  He has lost approximately 30 pounds over the last 2  years.  His Epworth score is 12 out of 24.   PHYSICAL EXAMINATION:  VITAL SIGNS:  He is 6 feet tall, 217 pounds,  temperature is 98.2, blood pressure is 110/62, heart rate is 74, oxygen  saturation 95% on room air.  HEENT:  Pupils reactive.  There is no sinus tenderness.  He has narrow  nasal angles.  He has a mild septal deviation to the left.  He has a  Mallampati-2 airway, a decreased AP diameter of his oropharynx, a  highish palate, and mild erosion over his lower front teeth.  There is  no lymphadenopathy, no thyromegaly.  HEART:  S1 S2.  He has a defibrillator in his left upper chest.  CHEST:  Clear to auscultation.  ABDOMEN:  Soft, nontender.  Positive bowel sounds.  EXTREMITIES:  No edema, cyanosis, clubbing.  NEUROLOGIC:  No focal deficits appreciated.   IMPRESSION:  He certainly has symptoms which would be concerning for  sleep disordered breathing.  This is of particular importance given his  history of coronary artery disease.  To further evaluate this, I will  arrange for him to undergo an overnight polysomnogram.  In the meantime,  I have discussed with him the importance of diet, exercise, and  maintaining his weight, as well as driving precautions.  I will follow  up with him after I have a chance to review his sleep study.     Coralyn Helling, MD  Electronically Signed    VS/MedQ  DD: 09/18/2006  DT: 09/18/2006  Job #: [id number]   cc:   Veverly Fells. Excell Seltzer, MD

## 2010-07-02 NOTE — Assessment & Plan Note (Signed)
[facility]                         ELECTROPHYSIOLOGY OFFICE NOTE   CLEVE, PAOLILLO                     MRN:          [id number]  DATE:06/17/2007                            DOB:          08-17-1935    Mr. [name] is seen.  He is 1 year status post heart attack.  He  continues to have complaints of neck and shoulder pain and has seen Dr.  Jeral Fruit as well as considering Dr. Ernest Haber, a chiropractic in  town.   He is having no problems with chest pain or shortness of breath.  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   MEDICATIONS:  1. Metoprolol 50.  2. Lisinopril 5.  3. Potassium.  4. Aspirin.   PHYSICAL EXAMINATION:  VITAL SIGNS:  Blood pressure 114/60, with a pulse  of 59.  His weight was 236 which represents about a 20-pound weight gain  since September and about a 30-pound weight gain since his heart attack.  LUNGS:  Clear.  HEART:  His heart sounds were regular.  EXTREMITIES:  No edema.   Interrogation of his St. Jude ICD demonstrates an R wave of 11 with  impedance of 650, a threshold of 0.5.  Battery voltage was 3.2.   IMPRESSION:  1. Aborted cardiac arrest status post implantable cardioverter      defibrillator.  2. Ischemic heart disease with prior bypass surgery and an ejection      fraction of 40-45%.  3. Dyslipidemia.   Mr. [name] is doing quite well from a rhythm point of view.  He will  follow up with Dr. Excell Seltzer.  We also need to increase his attention to  his weight.     Duke Salvia, MD, [facility]  Electronically Signed    SCK/MedQ  DD: 06/17/2007  DT: 06/17/2007  Job #: [id number]

## 2010-07-02 NOTE — Procedures (Signed)
NAME:  [name], Richard NO.:  [id number]   MEDICAL RECORD NO.:  [id number]          PATIENT TYPE:  OUT   LOCATION:  SLEEP CENTER                 FACILITY:  [facility]   PHYSICIAN:  Coralyn Helling, MD        DATE OF BIRTH:  [birth year]   DATE OF STUDY:  10/12/2006                            NOCTURNAL POLYSOMNOGRAM   REFERRING PHYSICIAN:   INDICATION FOR STUDY:  This is an individual who has a history of  coronary artery disease and ventricular fibrillation.  He is referred to  the Sleep Lab for evaluation of hypersomnia with obstructive sleep  apnea.   EPWORTH SLEEPINESS SCORE:  Seven.   MEDICATIONS:  Centrum, aspirin, glucosamine, chondroitin, Nexium,  Lipitor, fish oil, potassium, metoprolol, and lisinopril.   SLEEP ARCHITECTURE:  Total recording time was 394 minutes.  Total sleep  time was 337 minutes.  Sleep completion is 86%.  Sleep latency is 3  minutes which is reduced.  REM latency is 84 minutes.  The study was  notable for lack of slow wave sleep.  The patient slept predominantly in  the non-supine position.   RESPIRATORY DATA:  The average respiratory rate was 10.  The overall  apnea hypopnea index was 32.  The events were exclusively obstructive in  nature.  Loud snoring was noted by the technician.  The supine apnea  hypopnea index was 10.  The non-supine apnea hypopnea index was 33.  The  REM apnea hypopnea index was 73.  The non-REM apnea hypopnea index was  20.   OXYGEN DATA:  The baseline oxygenation was 95%.  The oxygen saturation  nadir was 77%.  The patient spent a total of 0.3 minutes with an oxygen  saturation between 71-80%, 71 minutes with an oxygen saturation between  81-90%, and 317 minutes with an oxygen saturation between 91-100%.   CARDIAC DATA:  The average heart rate was 62 and the rhythm strip showed  normal sinus rhythm.   MOVEMENT-PARASOMNIA:  The periodic limb movement index was 2.9.  The  patient had one bathroom trip.   IMPRESSIONS-RECOMMENDATIONS:  This study shows evidence for severe  obstructive sleep apnea as demonstrated by an apnea hypopnea index of 32  and an oxygen saturation rate of 77%.  Given the patient's history  of coronary artery disease and the severity of the sleep apnea,  consideration should be given to having the patient undergo CPAP therapy  for his sleep disordered breathing.      Coralyn Helling, MD  Diplomat, American Board of Sleep Medicine  Electronically Signed     VS/MEDQ  D:  10/20/2006 12:03:39  T:  10/20/2006 16:10:96  Job:  [id number]

## 2010-07-02 NOTE — Assessment & Plan Note (Signed)
[facility]                            CARDIOLOGY OFFICE NOTE   AZAZEL, FRANZE                     MRN:          [id number]  DATE:10/21/2006                            DOB:          Nov 06, 1935    [name] was seen in followup at the [facility] office on  October 21, 2006.  Mr. Wasko is a delightful 75 year old gentleman  who has ischemic cardiomyopathy and suffered an out-of-hospital cardiac  arrest on June 6th of this year.  He had a prolonged hospitalization but  is on the road to recovery.  He underwent ICD placement during his  hospitalization for secondary prevention of sudden cardiac death.  His  left ventricular systolic function is moderately reduced with an LVEF of  40 to 45%.  He has a large area of akinesis involving his left  ventricular apex, and this was likely the substrate for his ventricular  dysrrhythmia.  He underwent a cardiac catheterization after he  recovered, and he was found to have patent bypass grafts and stable CAD.   Mr. Fosberg is doing well from a standpoint of his recovery period.  He is involved in a regular walking program and as a participant has  been participating in cardiac rehabilitation.  He has had some problems  with his arms and hands and has been recently evaluated by Dr. Jeral Fruit  and is undergoing a CT myelogram of the cervical spine for further  evaluation.  He has not had any recurrent palpitations, lightheadedness  or syncope.  He denies chest pain or dyspnea.  He has not had edema,  orthopnea or PND.   CURRENT MEDICATIONS:  Include:  1. Glucosamine chondroitin.  2. Protonix 40 mg daily.  3. Potassium 40 mEq daily.  4. Aspirin 325 mg daily.  5. Lisinopril 5 mg daily.  6. Metoprolol ER 50 mg daily.  7. Lipitor 20 mg daily.   ALLERGIES:  NKDA.   EXAMINATION:  GENERAL:  He is alert and oriented, in no acute distress.  VITAL SIGNS:  His weight is 219 pounds, blood pressure  is 110/70, heart  rate 60, respiratory rate 16.  HEENT:  Normal.  NECK:  Normal carotid upstrokes without bruits.  Jugular venous pressure  is normal.  LUNGS:  Clear to auscultation bilaterally.  CARDIAC:  The heart sounds are distant.  The heart is regular rate and  rhythm without murmurs or gallops.  ABDOMEN:  Soft, nontender.  No organomegaly.  EXTREMITIES:  No clubbing, cyanosis or edema.  Peripheral pulses are 2+  and equal throughout.   ASSESSMENT:  1. Ischemic cardiomyopathy with out-of-hospital cardiac arrest.  Mr.      Cassetta is recovering well from his prolonged hospitalization.  I      encouraged him with regard to his maintenance of an exercise      program.  He should continue his current medical therapy, as he is      tolerating it well.  I am not inclined to titrate his medications      at this point, as we had to reduce them previously, due to  hypotension.  We will continue his current doses of metoprolol and      lisinopril.  Regarding his restrictions, he will not be able to      resume driving for 6 months from the time of his arrest.  I      communicated that with him and his wife today.  He wonders about      playing golf again, and I told him that he wound be able to play      next month, which will take him to three months out from his ICD      implant.  2. Coronary artery disease.  Stable at catheterization during his      hospitalization this summer.  He should continue with his current      medical regimen.  Continue secondary risk reduction with aspirin      and statin therapy.  3. Dyslipidemia.  He continues on Atorvastatin.  Lipid panel from      August 29th showed a total cholesterol of 99 with an HDL of 26, and      an LDL of 56.  Recommend no changes.   For followup, I would like to see Mr. Oestreich back in 4 months.  If he  has any problems in the interim, I would be happy to see him sooner.     Veverly Fells. Excell Seltzer, MD  Electronically  Signed    MDC/MedQ  DD: 10/21/2006  DT: 10/21/2006  Job #: [id number]

## 2010-07-02 NOTE — Discharge Summary (Signed)
[name], [name] NO.:  [id number]   MEDICAL RECORD NO.:  [id number]          PATIENT TYPE:  INP   LOCATION:  3743                         FACILITY:  [facility]   PHYSICIAN:  Veverly Fells. Excell Seltzer, MD  DATE OF BIRTH:  [birth year]   DATE OF ADMISSION:  07/24/2006  DATE OF DISCHARGE:  08/14/2006                               DISCHARGE SUMMARY   PRIMARY CARDIOLOGIST:  Veverly Fells. Excell Seltzer, MD   PRIMARY CARE PHYSICIAN:  Tawny Asal, MD.   ELECTROPHYSIOLOGIST:  Duke Salvia, MD, [facility]   CONSULTING PHYSICIAN:  Coralyn Helling, MD, pulmonology and critical care  medicine.   PROCEDURES PERFORMED DURING HOSPITALIZATION:  1. Cardiac catheterization August 06, 2006 performed by Dr. Tonny Bollman.  Left main stem diffusely diseased, heavily calcified with      a 60% distal stenosis.  LAD severely diseased, heavily calcified in      the proximal portion, there is an ostial stenosis in the range of      80%.  The vessel then courses down the remaining portion of the LAD      filled via graft and native flow.  There is a diagonal branch just      prior to the graft insertion site.  There is a large caliber      diagonal with normal flow pattern.  Left circumflex is heavily      diseased.  There is an ostial 70% stenosis.  There is a large first      OM branch that is grafted.  The ostium of that branch has an 80%      stenosis AV groove, circ then courses down and is diffusely      diseased with 70 to 80% lesions.  There is competitive flow in the      distal circumflex from another vein graft.  The right coronary      artery is occluded.  It occludes in the mid portion of the vessel.      The proximal portion of the vessel is heavily diseased and      calcified.  The saphenous vein graft to the OM is widely patent.      The OM1 is also filled with the circumflex system in retrograde      fashion.  Saphenous vein graft to the left posterolateral branch is      widely  patent.  It also fills to the circumflex in a retrograde      fashion.  It fills two large posterolateral branches.  LIMA to LAD      is widely patent.  There is a 40% lesion in the LAD just beyond the      graft at the insertion site.  There are collaterals provided to the      left from the LAD and septal perforators to the distal right      coronary artery.  The right coronary artery is as above occluded.      The distal vessel and the right PDA fill via collaterals that are      seen  both on the right angiography and native left angiography.      Left ventricular function assessed at 40 to 45%.  2. Implantation of single-chamber implantable cardiac defibrillator      per Dr. Lewayne Bunting.  This is a St. Jude VRRF single-chamber ICD      pacemaker, serial [id number], implanted on August 11, 2006.   DISCHARGE DIAGNOSES:  1. Out of hospital V-fib arrest status post ICD placement.  2. Ventilator dependent respiratory failure with aspiration pneumonia,      intubated for approximately four days.  3. Ischemic cardiomyopathy.  4. Hyperglycemia.  5. History of coronary artery disease status post coronary artery      bypass grafting, LIMA to LAD, SVG to first OM, SVG to distal circ      at [facility] in 1996.  6. History of gastroesophageal reflux disease.  7. Hypercholesterolemia.  8. Obesity.   HISTORY OF PRESENT ILLNESS:  This is a 75 year old, obese, Caucasian  male who was in his usual state of health remodeling a house with his  wife when he had a sudden episode of loss of consciousness which was  witnessed by his wife.  The patient was bending over and suddenly called  out for help stating he did not feel well, passed out  turned blue, also  vomited and was incontinent of urine.  The patient's wife immediately  called EMS.  She turned him on his side and when EMS arrived he was  placed on external cardiac defibrillator and found to be in ventricular  fibrillation.  The patient was  given one shock per defibrillator and CPR  was instituted.  The patient was intubated in the field and brought  emergently to [facility].   HOSPITAL COURSE:  On arrival to the emergency room, the patient was back  in normal sinus rhythm but remained unconscious.  The patient's wife was  at bedside and was able to be a good historian about his history.  The  patient's wife states he had had a similar episode in 1996 prior to the  coronary artery bypass grafting, and at the same time had been bending  over, passing out and was found to have V-fib and myocardial infarction  at that time.  Unfortunately, the patient had not been followed by  cardiology since 2004 and had been seen in the past by Dr. Willa Rough.   Critical care medicine was consulted to see the patient during initial  hospitalization and for vent management.  The patient remained  unconscious for approximately three days during his first admission.  The patient was placed on pressors secondary to hypotension and was also  monitored closely concerning his response to heparin and pressors.  The  patient was also found to have pulmonary edema and diuresis was  instituted.  Of note, the patient on admission was found to be  hypokalemic, and that was repleted.  It was found that the initial event  was probably related to V-tach or V-fib arrest, however, the patient was  found to have some aspiration pneumonia status post vomiting at the  site.  He was treated with antibiotics throughout the hospitalization  concerning this as well.  The patient was also found to have  encephalopathic anoxia and was slow to respond to stimulation during  initial hospital course.  The patient also had slow improvement as he  progressed with hospitalization.   On June 13 the patient was continuing to improve.  He was found  to have  some thrombocytopenia and heparin was discontinued as HIT panel was in  process.  The patient was found to have  acute on chronic CHF with  ischemic cardiomyopathy and was continued on diuresis.  The patient also  was continued on antibiotics secondary to persistent fever and pneumonia  to include Rocephin and clindamycin per critical care medicine and  pulmonary.  On August 03, 2006 the patient was extubated and placed on  100% Venti mask and slowly weaned to nasal cannula over the following  days.  The patient was becoming more responsive to his family and  following commands.  He was found to be severely deconditioned, and  physical therapy and occupational therapy were consulted for assistance  in strengthening.  The patient also had a swallowing study completed  secondary to aspiration pneumonia.  The patient was found to have no  overt symptoms of aspiration and was able to swallow and clear his  throat on command.  The patient had subsequent cardiac catheterization  completed by Dr. Tonny Bollman on 08/06/2006 with results as discussed  above.  The patient will be continued on medical treatment for coronary  artery disease.  The patient had slow improvement in neurologic  facilities and began to do well with physical therapy and occupational  therapy.  The patient was seen and evaluated by Dr. Lewayne Bunting on  08/10/2006 for implantation of ICD pacemaker secondary to V-fib arrest  outside of the hospital.  It was found that the patient's ejection  fraction was 40 to 45% with anterior apical scar noted.  The patient was  seen by Dr. Lewayne Bunting and Loura Pardon, PA for complete workup prior to  implantation.  Implantation was completed on 08/11/2006.  Please see Dr.  Lubertha Basque thorough operative note for more details.  He did have a St.  Jude ICD placement placed with serial numbers as discussed above.   The patient was then moved to a step-down unit and then subsequently to  the cardiology floor with continued improvement.  The patient was  working with physical therapy and had slow but steady  improvement as  well.  Unfortunately during one of his scheduled rehabilitation  exercises, the patient did have some dropping of his oxygen sats on 4  liters, but once the patient was able to rest his saturations returned  to normal.  Subsequent physical therapy walking did not elicit an oxygen  saturation drop of major significance and he improved significantly.   On the day of discharge, August 14, 2006, the patient was seen and  examined by Dr. Tonny Bollman and found to be stable for discharge.  Home Health was instituted for home continuation of physical therapy and  occupational therapy, as well as any home nursing that would be  necessary.  He is being evaluated at this time and admitted through home  health nursing service [facility] prior to discharge.  The  patient was feeling much better, anxious to go home with very strong  family and community support through his church.   Vital signs on discharge:  Blood pressure 109/63, pulse 78, respirations  18, O2 sat 98.   Hemoglobin 11.3, hematocrit 33.6, white blood cells 9.5, platelets 446,  sodium 134, potassium 4.4, chloride 100, CO2 26, glucose 114, BUN 24,  creatinine 1.18, albumin 2.9, calcium 9.0, LFTs within normal limits.  EKG dated August 07, 2006 revealed normal sinus rhythm with a nonspecific  intraventricular conduction delay noted.  Transthoracic echocardiogram  dated July 24, 2006 revealed images are extremely limited.  The septum is  akinetic.  The posterior wall moves, cannot assess further.  The left  ventricle is mildly dilated.  The study was inadequate for evaluation of  left ventricular regional wall motion.  Left atrial size was at the  upper limits of normal per Dr. Willa Rough.  Chest x-ray dated August 12, 2006 revealed status post AICD lead placement in the right ventricle,  mild atelectasis at the bases, improvement in air space disease.   DISCHARGE MEDICATIONS:  1. Protonix 40 mg daily.  2.  Potassium 40 mEq daily.  3. Coated aspirin 325 daily.  4. Lisinopril 10 mg daily.  5. Metoprolol 100 mg daily.  6. Simvastatin 40 mg at bedtime.  7. Xopenex 1 puff inhaler 3 times a day.  8. Percocet p.r.n. pain.   ALLERGIES:  HEPARIN.   FOLLOWUP PLAN:  1. The patient is to follow up with Dr. Tonny Bollman on July 7 at      10:15 a.m. post hospitalization evaluation.  2. The patient is scheduled at the pacemaker clinic post [facility] pacemaker      on July 14 at 9:20 a.m..  3. The patient is to follow up with Dr. Ladona Ridgel on October 7 at 9:40      a.m. post pacemaker insertion.  4. The patient has been admitted through [facility] and home      health nurses for physical therapy and      occupational therapy will be visiting him for continued out of      hospital care and management.  5. The patient is to follow up with his primary care physician Dr.      Scotty Court for continued medical management.   Time spent with the patient to include physician time:  1 hour.      Bettey Mare. Lyman Bishop, NP      Veverly Fells. Excell Seltzer, MD  Electronically Signed    KML/MEDQ  D:  08/14/2006  T:  08/15/2006  Job:  [id number]   cc:   Coralyn Helling, MD  Duke Salvia, MD, [facility]  Ellin Saba., MD

## 2010-07-02 NOTE — Consult Note (Signed)
NAMEMarland [name]  CHEO, SELVEY NO.:  [id number]   MEDICAL RECORD NO.:  [id number]          PATIENT TYPE:  INP   LOCATION:  2807                         FACILITY:  [facility]   PHYSICIAN:  Duke Salvia, MD, FACCDATE OF BIRTH:  02-19-1935   DATE OF CONSULTATION:  08/06/2006  DATE OF DISCHARGE:                                 CONSULTATION   Thank you very much for asking [place] to see [name] in  consultation because of an aborted cardiac arrest.   Mr. Maynes is a 75 year old gentleman who is now 12 years status post  bypass grafting in a setting of an ischemic cardiomyopathy and anterior  wall myocardial infarction.  However, we do not have any recent records  of ejection fraction.   He has been functionally very fit, working, traveling, doing [place]  [place]   On the day of his arrest, he came in from outside.  He apparently said,  I don't feel well and then passed out, became blue.  911 was called.  Apparently, they were there within minutes.  He was shocked.  His post  resuscitation course has been complicated by aspiration pneumonia and  prolonged bedrest.  He was extubated about 48 hours ago.   ADDENDUM:  The patient's ejection fraction last done by Cardiolite  scanning a couple of years ago was 43%.   PAST MEDICAL HISTORY:  His past medical history, in addition to the  above, is notable for:  1. GE reflux disease.  2. Hypercholesterolemia.  3. Obesity.   PAST SURGICAL HISTORY:  His past surgical history is as noted  previously.   REVIEW OF SYSTEMS:  His review of systems is noncontributory across  multiple organ systems.   SOCIAL HISTORY:  He is married.  He has two kids and works as noted.  He  does not use cigarettes or recreational drugs.  He does use occasional  wine.  Review of systems is negative, as noted.   PHYSICAL EXAMINATION:  On examination, he is an older Caucasian male  appearing his stated  age of 75.  His blood pressure is 133/58, his pulse  is 92.  His HEENT exam demonstrated no trismus, xanthoma.  His throat  was a little bit raspy.  His neck veins were hard to discern.  Carotids  were brisk and full bilaterally without bruits.  The back was without  kyphosis or scoliosis.  His lungs sounded surprisingly good with good  air movement.  Heart sounds were regular without murmurs or gallops.  The abdomen was soft.  Femoral pulses were not examined today.  Distal  pulses were intact.  There was no clubbing, cyanosis or edema.  Neurologic exam, surprisingly, was intact.   ELECTROCARDIOGRAM:  Electrocardiogram dated July 27, 2006, demonstrated  sinus rhythm at 89 with intervals of 0.12/0.13/0.35.  The axis was  leftward and -15.  His telemetry has been unrevealing.   LABORATORY DATA:  Laboratories were notable for transient elevation in  his liver enzymes, a peak troponin of 1.44 with a peak MB fraction of  11.5.  IMPRESSION:  1. Aborted cardiac arrest (ACA) with borderline elevated troponins.  2. Coronary artery disease:  A) Status post coronary artery bypass      graft in 1996, B) A prior myocardial infarction; C) Ejection      fraction more recently of 43% with an anterior wall scar.  3. Aspiration pneumonia, complicating number one.  4. Elevated LFTs, complicating number one.  5. Remote syncope.  6. Broad QRS.  7. Gastroesophageal reflux disease.  8. Obesity.   Mr. Essman has survived a cardiac arrest.  This appears to be a  primary electrical event based on the borderline troponins.  He is  scheduled to undergo catheterization today.  He will need ICD  implantation for secondary prevention.  The timing for this would be as  he gets stronger in anticipation of his going home.  An ICD put in too  early may impede his rehabilitation and therapy.   Other recommendations, including driving, will be reviewed later.   Thank you for the consultation.      Duke Salvia, MD, [facility]  Electronically Signed     SCK/MEDQ  D:  08/06/2006  T:  08/06/2006  Job:  [id number]

## 2010-07-02 NOTE — Assessment & Plan Note (Signed)
[facility]                         ELECTROPHYSIOLOGY OFFICE NOTE   KAY, RICCIUTI                     MRN:          [id number]  DATE:08/31/2006                            DOB:          [birth year]    Mr. [name] was seen today in the device clinic for follow up of his  St. Jude ICD implanted on August 11, 2006 for VF arrest.  Interrogation of  his device demonstrated R waves of 9.4 mV, an RV impedance of 1050 ohms.  This is increased from implant.  At implant, his impedance was 630 ohms.  His threshold was 1 volt at 0.5 ms.  His battery voltage was 3.20 volts  with a charge time of 10 seconds.  He was in normal sinus rhythm today  and is not pacemaker dependent.  He had not had any episodes of any  arrhythmias since last interrogation.  Mr. [name] impedance change  was discussed with Dr. Graciela Husbands today.  He will return to clinic in 2 weeks  to have that rechecked, and he will return to clinic in October of 2008  with Dr. Graciela Husbands.      Richard Balsam, RN,BSN  Electronically Signed      Duke Salvia, MD, [facility]  Electronically Signed   AS/MedQ  DD: 08/31/2006  DT: 08/31/2006  Job #: [id number]

## 2010-07-02 NOTE — Assessment & Plan Note (Signed)
[facility]                             PULMONARY OFFICE NOTE   SKYLER, CAREL                     MRN:          [id number]  DATE:01/12/2007                            DOB:          [birth year]    I saw Mr. Enfield today in follow-up for his severe obstructive sleep  apnea.  Since his last visit with me, he has undergone a CPAP titration  study on November 09, 2006.  He was titrated to a CPAP pressure setting  of 10 with a reduction in his apnea/hypopnea index to 2.  However, he  still had difficulty with air flow limitation in supine position.  I  therefore started him on CPAP at 11.  He has since been on CPAP at 11 cm  of water.  He says that he has noticed that he is sleeping better at  night and has more energy during the day and that he is not falling  asleep in meetings like he was previously.  His main trouble has been  getting adjusted to his mask.  He has tried nasal pillows which he  seemed to like but he only used them for a few days because he had  problems with the mask coming off in the middle of the night.  He also  had problems with mouth leak, but was never actually tried with a chin  strap.  He has since been using a full face mask and he is currently  using an ultra Mirage full face mask.  He says that he has been getting  abrasions over the bridge of his nose.  He has been trying to use a Band-  aid recently which does seem to help to some degree but he prefers the  nasal pillows.  He says that since he has been using the full face mask  he has also been having problems with aerophagia.   IMPRESSION:  Severe obstructive sleep apnea on CPAP at 11 cm of water.  What I will do is have him try it again on nasal pillows with a chin  strap.  I advised him to be persistent with this for at least several  weeks to determine if he is able to tolerate using a separate mask.   With regard to his aerophagia I will monitor him after  he has his mask  changed.  I have also advised him that he could use over-the-counter  Simethicone to help relieve this to some degree.  I will then plan on  following up with him in approximately four months, but have asked him  to call me if he has continued trouble with his mask or his aerophagia     Coralyn Helling, MD  Electronically Signed    VS/MedQ  DD: 01/12/2007  DT: 01/12/2007  Job #: [id number]   cc:   Veverly Fells. Excell Seltzer, MD  Duke Salvia, MD, [facility]  Dionne Ano. Everlene Other, M.D.  Hilda Lias, M.D.

## 2010-07-02 NOTE — H&P (Signed)
NAMEMarland Kitchen  [name], [name] NO.:  [id number]   MEDICAL RECORD NO.:  [id number]          PATIENT TYPE:  EMS   LOCATION:  MAJO                         FACILITY:  [facility]   PHYSICIAN:  Veverly Fells. Excell Seltzer, MD  DATE OF BIRTH:  11-10-1935   DATE OF ADMISSION:  07/24/2006  DATE OF DISCHARGE:                              HISTORY & PHYSICAL   PRIMARY CARE PHYSICIAN:  Tawny Asal, M.D.   PRIMARY CARDIOLOGIST:  Luis Abed, MD, [facility].   ELECTROPHYSIOLOGIST:  Duke Salvia, MD, [facility] (last seen in November  2004).   HISTORY OF PRESENT ILLNESS:  A 75 year old, obese, Caucasian male who  was in usual state of health remodeling a house with a sudden episode of  loss of consciousness, which was witnessed by his wife.  The patient was  bending over and suddenly called out for help and said, I don't feel  well.  And, he passed out, turned blue, became diaphoretic.  He was  incontinent of urine and did vomit.  The patient's wife who was with him  called EMS and the patient was seen by them emergently.  The patient was  placed on an external cardiac defibrillator, was found to have  ventricular fibrillation and had one shock per defibrillator and CPR was  instituted.  The patient was intubated and brought emergently to [facility] Emergency Room.  The patient is now stable with no  reoccurrence of ventricular arrhythmia.  The patient remains  unconscious.  Critical care medicine is at bedside with central line  being placed.  The wife states that he had a similar episode in 1996,  prior to CABG.  He was bending over, passed out, and was brought to the  emergency room and was found to be having a myocardial infarction at  that time.  Of note, the patient has not been seen by a cardiologist  since November 2004, and was last seen by Dr. Luis Abed.  He did  have a Cardiolite stress test at that time with results stating that he  became very dizzy and nauseated  while walking on the treadmill.  It was  changed to an adenosine.  The patient had good EF which was at 43% with  no significant ischemia.  However, the patient did have an anterior  apical defect which was compatible with a scar.   PAST MEDICAL HISTORY:  1. Myocardial infarction.      a.     Status post coronary artery bypass grafting LIMA to LAD, SVG       to first OM, SVG to distal circ here at [facility] in       1996.  2. The patient has a history of GERD.  3. Hypercholesterolemia.  4. Obesity.  5. According to prior cardiac office note, per Dr. Myrtis Ser, the patient      was also diagnosed with an underlying intraventricular conduction      delay and was to be seen by Dr. Sherryl Manges concerning this for EP      study, however, we have no records of  any followup.   PAST SURGICAL HISTORY:  Coronary artery bypass grafting as stated above.   SOCIAL HISTORY:  The patient lives in [place] with his wife.  He  remodels homes.  He has two children.  The patient was a remote history  of smoking in his 1s but stopped 45 years ago.  He drinks one glass of  wine at h.s.  No drug use.   REVIEW OF SYSTEMS:  Unable to obtain.  Wife states he has had no  complaints of any prior chest pain, shortness of breath, nausea,  vomiting, diaphoresis, or presyncope until this episode.   LABORATORY:  Pending.   CHEST X-RAY:  Pending.   ELECTROCARDIOGRAM:  Reveals a normal sinus rhythm with intraventricular  conduction delay.  The patient has T wave abnormality with ST elevation  mild noted in V1 and V2 with evidence of lateral ischemia.  Ventricular  rate is 90 beats per minute.   IMPRESSION:  1. Probable out of hospital ventricular fibrillation arrest.  2. Known coronary artery disease, status post coronary artery bypass      grafting in 1996.  3. History of myocardial infarction, 1996, with anterior apical scar.  4. Obesity.  5. History of prior intraventricular conduction delay per  Dr. Myrtis Ser.   PLAN:  This is a 75 year old, obese, Caucasian male who had an out of  hospital Vfib arrest while remodeling a house.  He is now intubated and  being followed by critical care medicine.  EKG does not show STEMI at  this time.  We will repeat EKG within one hour.  The patient will be  placed on a heparin drip.  The patient will be admitted to ICU and  monitored very closely.  We will cycle cardiac enzymes and labs.  The  patient will be considered for cardiac catheterization later today if  the patient stabilizes and cardiac enzymes come back severely elevated.  Of note, the patient did have one defibrillation completed outside of  the hospital prior to being seen in the ER.  This has been discussed  with Dr. Tonny Bollman who was at the bedside.  We will make further  recommendations throughout hospital course once the patient is  stabilized.      Bettey Mare. Lyman Bishop, NP      Veverly Fells. Excell Seltzer, MD  Electronically Signed    KML/MEDQ  D:  07/24/2006  T:  07/24/2006  Job:  [id number]   cc:   Ellin Saba., MD

## 2010-07-02 NOTE — Assessment & Plan Note (Signed)
[facility]                         ELECTROPHYSIOLOGY OFFICE NOTE   Richard, [name]                     MRN:          [id number]  DATE:09/17/2006                            DOB:          Dec 21, 1935    [name] was seen today in the clinic on September 17, 2006 for a  recheck of his outputs from implant done on June 24th.  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  Jude, model [id number], current.  Put in for VF arrest.  On interrogation  of his device today, his battery voltage is greater than 3.2 with a  charge time of 10 seconds.  R waves measured 8.9 mV with a ventricular  capture threshold of 0.75 volts at 0.5 ms and a ventricular lead  impedance of 640 ohm.  Shock impedance was 46.   There were no episodes, and with these stable outputs, we will see him  back again in October.      Altha Harm, LPN  Electronically Signed      Duke Salvia, MD, [facility]  Electronically Signed   PO/MedQ  DD: 09/17/2006  DT: 09/18/2006  Job #: [id number]

## 2010-07-02 NOTE — Procedures (Signed)
NAME:  [name], [name] NO.:  [id number]   MEDICAL RECORD NO.:  [id number]          PATIENT TYPE:  OUT   LOCATION:  SLEEP CENTER                 FACILITY:  [facility]   PHYSICIAN:  Coralyn Helling, MD        DATE OF BIRTH:  1935/03/04   DATE OF STUDY:  11/09/2006                            NOCTURNAL POLYSOMNOGRAM   REFERRING PHYSICIAN:   INDICATION FOR STUDY:  This individual had an overnight polysomnogram on  October 12, 2006, and was found to have severe obstructive sleep apnea  with an apnea/hypopnea index of 32 with an oxygen saturation nadir of  77%.  She returns to the sleep lab for a CPAP titration study.   EPWORTH SLEEPINESS SCORE:  7.   MEDICATIONS:  Centrum Silver, aspirin, Glucosamine, Nexium, Lipitor,  Fish oil, potassium, metoprolol, lisinopril, and B6.  The patient took  an Ambien on the night of the study.   SLEEP ARCHITECTURE:  Total time was 407 minutes.  Total sleep time was  368 minutes.  Sleep efficiency is 91%.  Sleep latency is 10 minutes.  REM latency is 90 minutes.  The patient was observed in all stages of  sleep.  The patient slept in both supine and non-supine position.   RESPIRATORY DATA:  The average respiratory rate was 18.  The patient was  titrated from a CPAP pressure setting of 5 to 10 cm of water.  At a CPAP  pressure setting of 10 cm of water, the apnea/hypopnea index was reduced  to 2 and snoring was eliminated.  At this pressure, the patient was  observed in REM sleep and supine sleep, but appeared to have continued  air flow obstruction in the supine position.   OXYGEN DATA:  The baseline oxygenation was 98%.  The oxygen saturation  nadir was 89%.  At a CPAP pressure setting of 10 cm of water, the mean  oxygenation during non-REM sleep was 97%.  The mean oxygenation during  REM sleep was 97%.  The minimal oxygenation during non REM sleep was  91%.  The minimal oxygenation during REM sleep was 95%.   CARDIAC DATA:  The average  heart rate was 61 and the rhythm strip showed  sinus rhythm.   MOVEMENT-PARASOMNIA:  The periodic limb movement index was 15.   IMPRESSIONS-RECOMMENDATIONS:  This was a CPAP titration study. At a CPAP  pressuring setting of 10 cm of water, the apnea/hypopnea index was  reduced to 2.  However, the patient appeared to still have some  difficulty with air flow limitation while in supine position.  What I  recommend is to start the patient on CPAP at 11 cm of water and monitor  him for his clinical response.  If he is still having difficulty after  this, then he could undergo an  auto CPAP titration study on an outpatient basis or alternatively return  to the sleep lab for an additional CPAP titration study starting at 11  cm of water.      Coralyn Helling, MD  Diplomat, American Board of Sleep Medicine  Electronically Signed     VS/MEDQ  D:  11/16/2006 16:56:27  T:  11/17/2006  00:35:14  Job:  [id number]

## 2010-07-02 NOTE — Cardiovascular Report (Signed)
NAMESEIJI, WISWELL NO.:  [id number]   MEDICAL RECORD NO.:  [id number]          PATIENT TYPE:  INP   LOCATION:  2807                         FACILITY:  [facility]   PHYSICIAN:  Veverly Fells. Excell Seltzer, MD  DATE OF BIRTH:  December 29, 1935   DATE OF PROCEDURE:  08/06/2006  DATE OF DISCHARGE:                            CARDIAC CATHETERIZATION   PROCEDURES:  1. Left heart catheterization.  2. Selective coronary angiography.  3. Left ventricular angiography.  4. Saphenous vein graft angiography.  5. LIMA angiography.   INDICATIONS:  [name] is a 75 year old gentleman with known  coronary artery disease.  He is status post coronary bypass surgery  several years ago.  He presented with an out of hospital cardiac arrest  and was resuscitated.  He had a prolonged course with ventilator  dependent respiratory failure.  He has recovered and is brought in for  cardiac catheterization prior to receiving an ICD as he had a witnessed  at home arrest and was defibrillated during his resuscitation.  He has a  known history of prior anterior wall infarct.   Risks and indications of procedure were explained to the patient.  Informed consent was obtained.  The right groin was prepped, draped,  anesthetized with 1% lidocaine.  Using the modified Seldinger technique,  a 6-French sheath was placed in the right femoral artery.  Multiple  views of the left and right coronary arteries were taken with standard  Judkins preformed catheters.  The JR-4 catheter was then used to image  the saphenous vein grafts.  It was also used to enter the left  subclavian artery but I could not selectively engage the left internal  mammary with a JR-4.  It was exchanged out using an exchange length J-  tip wire for a LIMA catheter and selective LIMA angiography was  performed.  After native and graft angiography, an angled pigtail  catheter was inserted into the left ventricle and pressures were  recorded.   Left ventriculogram was performed.  A pullback across the  aortic valve was done.   FINDINGS:  Aortic pressure 96/45 with a mean of 67, left ventricular  pressures 10/17.   The left mainstem is diffusely diseased.  It is heavily calcified.  There is a 60% distal stenosis.   The LAD is severely diseased.  It is heavily calcified in its proximal  portion.  There is an ostial stenosis in the range of 80%.  The vessel  then courses down and remaining portions of the LAD fill via graft and  native flow.  There is a diagonal branch just prior to the graft  insertion site that is a large caliber diagonal with a normal flow  pattern.   The left circumflex is heavily diseased.  There is an ostial 70%  stenosis.  There is a large first OM branch that is grafted.  The ostium  of that branch has an 80% stenosis.  The AV groove circ then courses  down and is diffusely diseased with 70 and 80% lesions.  There is  competitive flow in the distal circumflex from another vein graft.  The right coronary artery is occluded.  It occludes in the midportion of  the vessel.  The proximal portion of the vessel is heavily diseased and  calcified.   The saphenous vein graft to the OM-1 is widely patent.  The OM-1 also  fills some of the circumflex system in a retrograde fashion.   Saphenous vein graft to a left posterolateral branch is also widely  patent.  It also fills the circumflex in a retrograde fashion and fills  two large posterolateral branches.   LIMA to LAD is widely patent.  There is a 40% lesion in the LAD just  beyond the graft insertion site.  There are collaterals provided from  the left from the LAD and septal perforators to the distal right  coronary artery.   The right coronary artery as above is occluded.  The distal vessel and  the right PDA fill via left collaterals that are seen both on the graft  angiography and native left coronary angiography.   Left ventricular function  assessed with a 30 degrees RAO left  ventriculogram demonstrates anteroapical akinesis with calcification of  the apex.  The overall LVEF is in the mild to moderate range with an EF  of 40-45%.  The remaining segments of the left ventricle are normal.   ASSESSMENT:  1. Severe native three-vessel coronary artery disease.  2. Status post coronary bypass surgery with 3/3 grafts widely patent.  3. Mild to moderate left ventricular systolic dysfunction secondary to      anteroapical scar.   PLAN:  1. We will continue medical therapy.  2. Mr. Rayos is recovering well from his prolonged hospitalization      requiring mechanical ventilation after out of hospital cardiac      arrest.  3. We will continue with rehabilitation.  4. He will require an ICD prior to his discharge home.      Veverly Fells. Excell Seltzer, MD  Electronically Signed     MDC/MEDQ  D:  08/06/2006  T:  08/06/2006  Job:  [id number]

## 2010-07-02 NOTE — Assessment & Plan Note (Signed)
 HEALTHCARE                         ELECTROPHYSIOLOGY OFFICE NOTE   [name], [name]                     MRN:          [id number]  DATE:12/02/2006                            DOB:          1935-08-28    [name] is seen following cardiac arrest in the setting of mild to  moderate LV dysfunction and coronary artery disease and prior myocardial  infarction.  He is doing well at this point without complaints of chest  pain or shortness of breath.  He has recently undergone carpal tunnel  release of his right hand and he continues to see Dr. Amanda Pea and Dr.  Jeral Fruit.   His other complaint is that sometimes his blood pressure is noted to be  low at cardiac rehab, particularly in the high 90s.  He has no symptoms  of lightheadedness with this.  He has had no recurrent syncope.   On examination, his blood pressure is 110/64 with a pulse of 59.  Lungs  were clear, heart sounds were regular and extremities were without  edema.   Interrogation of his St. Jude ICD demonstrates an R wave of 8.9 with  impedance of 640, a threshold of 0.75 at 0.4, the battery voltage 3.2.  There are no intercurrent episodes.   IMPRESSION:  1. Aborted cardiac arrest (ACA).  2. Ischemic cardiomyopathy.      a.     Depressed left ventricular function, ejection fraction 40-       45%.      b.     Prior bypass surgery.      c.     Prior myocardial infarction.  3. Recent surgery for upper extremity weakness and carpal tunnel.   [name] is doing well.  We will see him again in 3 months' time in  the device clinic.     Duke Salvia, MD, [facility]  Electronically Signed    SCK/MedQ  DD: 12/02/2006  DT: 12/03/2006  Job #: [id number]   cc:   Dionne Ano. Everlene Other, M.D.  Hilda Lias, M.D.

## 2010-07-02 NOTE — Assessment & Plan Note (Signed)
[facility]                             PULMONARY OFFICE NOTE   MORRILL, BOMKAMP                     MRN:          [id number]  DATE:10/27/2006                            DOB:          [birth year]    I saw Mr. Steinberger with his wife today in follow-up for his sleep  apnea.   He had an overnight polysomnogram done on October 12, 2006, and this  showed evidence of severe obstructive sleep apnea with an apnea/hypopnea  index of 32 and an oxygen saturation nadir of 77%.   I have reviewed the results of his sleep study with him.  I have  discussed the adverse consequences of sleep apnea including risks of  hypertension, coronary artery disease, cerebrovascular disease,  diabetes, and arrhythmias.  I had discussed with him the importance of  diet, exercise, and weight reduction as well as the avoidance of alcohol  and sedatives.  Driving precautions were reviewed with him as well.  I  had reviewed the various treatment options for his sleep apnea including  CPAP therapy, oral appliance, and surgical intervention.   Given the severity of his sleep apnea as well as his underlying  cardiovascular disease, I have recommended that he have CPAP therapy.  I  will therefore make arrangements for him to undergo a CPAP titration  study.  I will initiate him on CPAP after this and follow up with him in  the office 3-4 weeks after that.   Mr. Lorey has also informed him that he is having evaluation with  Dr. Jeral Fruit for neck problems as well as hand weakness and numbness.  I  have advised the patient that if he is to undergo any surgical  intervention for these problems that he would need to notify Dr. Jeral Fruit  as well as the anesthesiologist about his diagnosis of sleep apnea so  that appropriate precautions can be taken.     Coralyn Helling, MD  Electronically Signed    VS/MedQ  DD: 10/27/2006  DT: 10/28/2006  Job #: [id number]   cc:   Veverly Fells. Excell Seltzer,  MD  Hilda Lias, M.D.

## 2010-07-02 NOTE — Assessment & Plan Note (Signed)
[facility]                            CARDIOLOGY OFFICE NOTE   TAMMY, ERICSSON                     MRN:          [id number]  DATE:08/24/2006                            DOB:          02-07-1936    Mr. Beavers returns for hospital followup at the [facility]  Office on August 24, 2006 after a prolonged hospitalization at [facility]  from June 6-June 27. In brief, Mr. Roethler had suffered an out of  hospital cardiac arrest on June 6. His wife was present and she called  EMS immediately. He was given CPR and underwent an AED shock upon EMS  arrival, presumably for ventricular fibrillation. The initial portion of  his hospitalization was complicated by ventilator dependent respiratory  failure thought to be secondary to a combination of congestive heart  failure and aspiration pneumonia. He was treated with a course of  antibiotics and ultimately was extubated. His cardiac rhythm during his  hospitalization was stable. He had fevers throughout the first few weeks  of his hospitalization. Again, thought to be secondary to aspiration  pneumonia. Once he was extubated and recovered, he underwent a  diagnostic catheterization, which demonstrated severe native vessel  coronary artery disease with patent bypass grafts. The LV EF by both  echo and catheterization was estimated between 40 and 45%. There was a  large area of akinesis involving the LV apex from a prior infarction.  After his catheterization, he ultimately underwent ICD placement by Dr.  Ladona Ridgel. He required physical therapy in the hospital because of his  prolonged stay in the ICU, but he continued to progress well and was  ultimately stable for discharge on June 27.   Since his return home, he has done well from a symptomatic standpoint.  He has been fatigued, but his strength has been improving slowly. He  started with home physical therapy and is going to have his third  physical  therapy session this week. He has been using a walker outside  of the home and has been walking on his own inside the home. He has had  no dyspnea, orthopnea, PND or chest pain. He has had no syncope. He is  tolerating his medications well and has no other complaints at this  time.   CURRENT MEDICATIONS:  1. Glucosamine.  2. Protonix 40 mg daily.  3. Potassium 40 mEq daily.  4. Aspirin 325 mg daily.  5. Lisinopril 10 mg daily.  6. Metoprolol ER 100 mg daily.  7. Lipitor 20 mg daily.  8. Xopenex three times daily.  9. Diclofenac.   ALLERGIES:  No known drug allergies.   PHYSICAL EXAMINATION:  He is alert and oriented in no acute distress.  Weight is 206 pounds, blood pressure 118/59, heart rate 55, respiratory  rate is 16.  HEENT: Is normal.  NECK: Normal carotid upstrokes without bruits. Jugular venous pressure  is normal.  LUNGS:  Clear to auscultation bilaterally.  CARDIOVASCULAR: The heart is regular rate and rhythm without murmurs or  gallops.  ABDOMEN: Soft and nontender. No organomegaly. No abdominal bruits.  EXTREMITIES: No clubbing, cyanosis or  edema. Peripheral pulses are 2+  and equal throughout.   EKG: Demonstrates normal sinus bradycardia with anteroseptal T-wave  changes consistent with ischemia and a nonspecific intraventricular  conduction delay.   ASSESSMENT:  Mr. Nemes is recovering well from his recent  hospitalization at [facility]. His cardiac problems are as follows:  1. Out of hospital cardiac arrest. While his LV EF is only mildly      reduced, he likely suffered an ventricular dysrhythmia arising from      his area of scar involving the LV apex. An ICD has now been placed.      He will have routine followup in [facility] and also will      see Dr.  Graciela Husbands. We had a discussion regarding driving restrictions      and restrictions following ICD placement. I have advised him that      he cannot drive for at least six weeks and then he will  need to      review this with either Dr.  Graciela Husbands or Dr.  Ladona Ridgel prior to resuming      driving after suffering an out of hospital arrest. He has pacemaker      followup scheduled already.  2. Coronary artery disease. He has patent bypass grafts as seen on his      recent catheterization. He should continue with his current medical      therapy which includes antiplatelet therapy with aspirin,      lisinopril, metoprolol and statin therapy.  3. Dyslipidemia. He was discharged on simvastatin, but tells me today      that he was on atorvastatin prior to his hospitalization. I have      advised that he resume atorvastatin 20 mg, which he has been on for      several years.  4. Question of sleep apnea. His wife reports that he is having pauses      in breathing at night. We have set him up for overnight oxymetry.  5. Followup. For followup, I would like to check a basic metabolic      panel in two weeks. He is starting diclofenac for arthritic      problems and since he is on an ACE inhibitor and other cardiac      medications, I think it would be prudent to check a metabolic panel      to rule out any renal insufficiency while taking diclofenac. Will      repeat lipids and LFTs in 8-10 weeks and I would like to see him      back in outpatient followup in approximately three months. If he      has problems will bring him back sooner. I encouraged him to      continue with physical therapy and this will aid in the      rehabilitation process.     Veverly Fells. Excell Seltzer, MD  Electronically Signed    MDC/MedQ  DD: 08/24/2006  DT: 08/25/2006  Job #: [id number]   cc:   Ellin Saba., MD  Duke Salvia, MD, [facility]

## 2010-07-02 NOTE — Consult Note (Signed)
NAMEMarland Kitchen  [name], [name] NO.:  [id number]   MEDICAL RECORD NO.:  [id number]          PATIENT TYPE:  EMS   LOCATION:  [facility]                         FACILITY:  [facility]   PHYSICIAN:  Coralyn Helling, MD        DATE OF BIRTH:  Jul 28, 1935   DATE OF CONSULTATION:  07/24/2006  DATE OF DISCHARGE:                                 CONSULTATION   REASON FOR CONSULTATION:  Acute respiratory failure.   [name] is a 75 year old male who has a history of coronary artery  disease, status post coronary artery bypass grafting and myocardial  infarction.  He apparently was working outside earlier today when he had  shouted out to his wife, he then suddenly collapsed.  On arrival by the  paramedics, he was found to be in a malignant rhythm and was  defibrillated.  He was brought to the emergency room and intubate.  Upon  arrival to the emergency room, he was in a perfusable rhythm with a  blood pressure.  He initially was hypoxic with his pulse oximetry in the  low to mid 80 percentile despite being on the ventilator.  A recruitment  maneuver was done with improvement in his oxygen saturation, as well as  his respiratory pattern.   PAST MEDICAL HISTORY:  Significant for:  1. Coronary artery disease, status post myocardial infarction.  2. Coronary artery bypass grafting.  3. Gastroesophageal reflux disease.   HE HAS NO KNOWN DRUG ALLERGIES.   MEDICATIONS:  Nexium.   FAMILY HISTORY:  Noncontributory.   SOCIAL HISTORY:  He is married.  He works in [employer].  He has a  remote smoking history.  There is no history of significant alcohol use.   REVIEW OF SYSTEMS:  Unable to obtain.   PHYSICAL EXAMINATION:  In the emergency room.  GENERAL:  He is intubated, sedated and paralyzed.  VITAL SIGNS:  Blood pressure is 158/55.  Heart rate is 100 up from 82.  Oxygen saturation 100%.  Respiratory rate is 28.  HEENT:  Pupils are mid point.  He has an endotracheal tube in place.  There is  no jugular venous distention.  HEART:  S1, S2.  CHEST:  Bilateral rales.  ABDOMEN:  Obese, soft, absent bowel sounds.  No masses.  GU:  No obvious lesions.  EXTREMITIES:  Feel warm to the touch with palpable pulses.  NEUROLOGIC EXAM:  He is intubated, sedated and paralyzed.   Chest x-ray and laboratory results are pending at the present time.   IMPRESSION:  1. Ventricular fibrillation with cardiac arrest.  He needs to be      admitted to the cardiology service and further management of this      issue will be dealt with by the cardiologist.  2. Acute respiratory failure with need for intubation with mechanical      ventilation and followup on his blood gas and his chest x-ray.      There is a concern that he may have aspirated prior to be      intubated.  I would hold off on starting him on antibiotics at the  present time.  However, if he were to develop a significant      increase in his white count or were to develop a fever or any      significant infiltrate or chest pain, then antibiotics will be      appropriate at that time.  I would start him on a sedation      protocol.  We will need to monitor his neurologic status.      Hopefully, if he is able to redeem his neurologic status and his      cardiac status is stabilized, then we will proceed with weaning      from the ventilator.  In the meantime, I have continued him on      sedation protocol.  3. History of reflux disease.  I will start him on Protonix      intravenously for his reflux disease, as well as for peptic ulcer      disease prophylaxis.  4. Deep venous thrombosis prophylaxis.  I will defer the choice of      this to cardiology as they may want to hold anticoagulation.  If      not, then it would be appropriate to continue him on low molecular      heparin.      Coralyn Helling, MD  Electronically Signed     VS/MEDQ  D:  07/24/2006  T:  07/24/2006  Job:  [id number]

## 2010-07-23 ENCOUNTER — Telehealth: Payer: Self-pay

## 2010-07-23 MED ORDER — PROCHLORPERAZINE MALEATE 10 MG PO TABS: 10.0000 mg | ORAL_TABLET | Freq: Four times a day (QID) | ORAL | Status: AC | PRN

## 2010-07-23 MED ORDER — PROMETHAZINE HCL 50 MG RE SUPP: 50.0000 mg | Freq: Four times a day (QID) | RECTAL | Status: AC | PRN

## 2010-07-23 NOTE — Telephone Encounter (Signed)
Pt's wife called and stated that her husband had been vomiting and had diarrhea all night long and wanted to know if Dr. Scotty Court could call something in for her husband.  Per Dr. Scotty Court phene 50mg  supp and Compazine 10mg .  Pt is aware that rx have been sent in.

## 2010-09-19 ENCOUNTER — Ambulatory Visit (INDEPENDENT_AMBULATORY_CARE_PROVIDER_SITE_OTHER): Payer: Medicare Other | Admitting: *Deleted

## 2010-09-19 ENCOUNTER — Encounter: Payer: Self-pay | Admitting: Internal Medicine

## 2010-09-19 ENCOUNTER — Other Ambulatory Visit: Payer: Self-pay | Admitting: Internal Medicine

## 2010-09-19 DIAGNOSIS — I4901 Ventricular fibrillation: Secondary | ICD-10-CM

## 2010-09-19 LAB — REMOTE ICD DEVICE
BATTERY VOLTAGE: 2.98 V
BRDY-0002RV: 40 {beats}/min
DEV-0020ICD: NEGATIVE
DEVICE MODEL ICD: 449388
HV IMPEDENCE: 50 Ohm
RV LEAD AMPLITUDE: 10.6 mv
RV LEAD IMPEDENCE ICD: 690 Ohm
TZAT-0001FASTVT: 1
TZAT-0001SLOWVT: 1
TZAT-0004FASTVT: 8
TZAT-0004SLOWVT: 8
TZAT-0012FASTVT: 200 ms
TZAT-0012SLOWVT: 200 ms
TZAT-0013FASTVT: 1
TZAT-0013SLOWVT: 4
TZAT-0018FASTVT: NEGATIVE
TZAT-0018SLOWVT: NEGATIVE
TZAT-0019FASTVT: 7.5 V
TZAT-0019SLOWVT: 7.5 V
TZAT-0020FASTVT: 1 ms
TZAT-0020SLOWVT: 1 ms
TZON-0003FASTVT: 300 ms
TZON-0003SLOWVT: 340 ms
TZON-0004FASTVT: 12
TZON-0004SLOWVT: 24
TZON-0005FASTVT: 6
TZON-0005SLOWVT: 6
TZON-0010FASTVT: 80 ms
TZON-0010SLOWVT: 80 ms
TZST-0001FASTVT: 2
TZST-0001FASTVT: 3
TZST-0001FASTVT: 4
TZST-0001FASTVT: 5
TZST-0001SLOWVT: 2
TZST-0001SLOWVT: 3
TZST-0001SLOWVT: 4
TZST-0001SLOWVT: 5
TZST-0003FASTVT: 36 J
TZST-0003FASTVT: 36 J
TZST-0003FASTVT: 36 J
TZST-0003FASTVT: 36 J
TZST-0003SLOWVT: 25 J
TZST-0003SLOWVT: 36 J
TZST-0003SLOWVT: 36 J
TZST-0003SLOWVT: 36 J
VENTRICULAR PACING ICD: 1 pct

## 2010-09-25 NOTE — Progress Notes (Signed)
icd remote check  

## 2010-12-04 LAB — COMPREHENSIVE METABOLIC PANEL
ALT: 140 — ABNORMAL HIGH
ALT: 183 — ABNORMAL HIGH
ALT: 28
AST: 117 — ABNORMAL HIGH
AST: 18
AST: 62 — ABNORMAL HIGH
Albumin: 2.2 — ABNORMAL LOW
Albumin: 2.3 — ABNORMAL LOW
Albumin: 2.9 — ABNORMAL LOW
Alkaline Phosphatase: 69
Alkaline Phosphatase: 80
Alkaline Phosphatase: 83
BUN: 24 — ABNORMAL HIGH
BUN: 26 — ABNORMAL HIGH
BUN: 27 — ABNORMAL HIGH
CO2: 26
CO2: 30
CO2: 32
Calcium: 8.3 — ABNORMAL LOW
Calcium: 8.3 — ABNORMAL LOW
Calcium: 9
Chloride: 100
Chloride: 102
Chloride: 104
Creatinine, Ser: 1.05
Creatinine, Ser: 1.12
Creatinine, Ser: 1.18
GFR calc Af Amer: >60
GFR calc Af Amer: >60
GFR calc Af Amer: >60
GFR calc non Af Amer: >60
GFR calc non Af Amer: >60
GFR calc non Af Amer: >60
Glucose, Bld: 114 — ABNORMAL HIGH
Glucose, Bld: 117 — ABNORMAL HIGH
Glucose, Bld: 155 — ABNORMAL HIGH
Potassium: 3.7
Potassium: 3.9
Potassium: 4.4
Sodium: 134 — ABNORMAL LOW
Sodium: 140
Sodium: 144
Total Bilirubin: 0.7
Total Bilirubin: 0.9
Total Bilirubin: 1.4 — ABNORMAL HIGH
Total Protein: 6.3
Total Protein: 6.6
Total Protein: 7.4

## 2010-12-04 LAB — CBC
HCT: 31.2 — ABNORMAL LOW
HCT: 31.4 — ABNORMAL LOW
HCT: 31.7 — ABNORMAL LOW
HCT: 31.8 — ABNORMAL LOW
HCT: 32.3 — ABNORMAL LOW
HCT: 32.5 — ABNORMAL LOW
HCT: 33.1 — ABNORMAL LOW
HCT: 33.5 — ABNORMAL LOW
HCT: 33.6 — ABNORMAL LOW
HCT: 33.7 — ABNORMAL LOW
HCT: 34 — ABNORMAL LOW
HCT: 34.2 — ABNORMAL LOW
HCT: 35.3 — ABNORMAL LOW
Hemoglobin: 10.5 — ABNORMAL LOW
Hemoglobin: 10.5 — ABNORMAL LOW
Hemoglobin: 10.6 — ABNORMAL LOW
Hemoglobin: 10.7 — ABNORMAL LOW
Hemoglobin: 10.9 — ABNORMAL LOW
Hemoglobin: 10.9 — ABNORMAL LOW
Hemoglobin: 11.1 — ABNORMAL LOW
Hemoglobin: 11.2 — ABNORMAL LOW
Hemoglobin: 11.3 — ABNORMAL LOW
Hemoglobin: 11.3 — ABNORMAL LOW
Hemoglobin: 11.3 — ABNORMAL LOW
Hemoglobin: 11.5 — ABNORMAL LOW
Hemoglobin: 11.8 — ABNORMAL LOW
MCHC: 32.9
MCHC: 33.3
MCHC: 33.3
MCHC: 33.3
MCHC: 33.4
MCHC: 33.4
MCHC: 33.5
MCHC: 33.5
MCHC: 33.6
MCHC: 33.6
MCHC: 33.8
MCHC: 33.8
MCHC: 34.1
MCV: 89
MCV: 89.1
MCV: 89.2
MCV: 89.4
MCV: 89.7
MCV: 89.9
MCV: 90
MCV: 90.2
MCV: 90.3
MCV: 90.4
MCV: 90.8
MCV: 91
MCV: 91.3
Platelets: 296
Platelets: 310
Platelets: 409 — ABNORMAL HIGH
Platelets: 446 — ABNORMAL HIGH
Platelets: 472 — ABNORMAL HIGH
Platelets: 482 — ABNORMAL HIGH
Platelets: 486 — ABNORMAL HIGH
Platelets: 515 — ABNORMAL HIGH
Platelets: 531 — ABNORMAL HIGH
Platelets: 546 — ABNORMAL HIGH
Platelets: 564 — ABNORMAL HIGH
Platelets: 621 — ABNORMAL HIGH
Platelets: 628 — ABNORMAL HIGH
RBC: 3.47 — ABNORMAL LOW
RBC: 3.48 — ABNORMAL LOW
RBC: 3.49 — ABNORMAL LOW
RBC: 3.51 — ABNORMAL LOW
RBC: 3.62 — ABNORMAL LOW
RBC: 3.64 — ABNORMAL LOW
RBC: 3.67 — ABNORMAL LOW
RBC: 3.72 — ABNORMAL LOW
RBC: 3.74 — ABNORMAL LOW
RBC: 3.76 — ABNORMAL LOW
RBC: 3.78 — ABNORMAL LOW
RBC: 3.8 — ABNORMAL LOW
RBC: 3.88 — ABNORMAL LOW
RDW: 13.1
RDW: 13.1
RDW: 13.3
RDW: 13.4
RDW: 13.5
RDW: 13.5
RDW: 13.6
RDW: 13.7
RDW: 13.9
RDW: 14.4 — ABNORMAL HIGH
RDW: 14.4 — ABNORMAL HIGH
RDW: 14.6 — ABNORMAL HIGH
RDW: 14.7 — ABNORMAL HIGH
WBC: 10
WBC: 10
WBC: 12 — ABNORMAL HIGH
WBC: 14.1 — ABNORMAL HIGH
WBC: 14.7 — ABNORMAL HIGH
WBC: 15.7 — ABNORMAL HIGH
WBC: 17.3 — ABNORMAL HIGH
WBC: 18.1 — ABNORMAL HIGH
WBC: 9.3
WBC: 9.5
WBC: 9.5
WBC: 9.6
WBC: 9.8

## 2010-12-04 LAB — BASIC METABOLIC PANEL
BUN: 18
BUN: 19
BUN: 24 — ABNORMAL HIGH
BUN: 28 — ABNORMAL HIGH
BUN: 31 — ABNORMAL HIGH
CO2: 26
CO2: 26
CO2: 27
CO2: 29
CO2: 32
Calcium: 7.9 — ABNORMAL LOW
Calcium: 8.1 — ABNORMAL LOW
Calcium: 8.3 — ABNORMAL LOW
Calcium: 8.5
Calcium: 9
Chloride: 100
Chloride: 103
Chloride: 103
Chloride: 105
Chloride: 106
Creatinine, Ser: 1.13
Creatinine, Ser: 1.19
Creatinine, Ser: 1.23
Creatinine, Ser: 1.27
Creatinine, Ser: 1.3
GFR calc Af Amer: >60
GFR calc Af Amer: >60
GFR calc Af Amer: >60
GFR calc Af Amer: >60
GFR calc Af Amer: >60
GFR calc non Af Amer: 55 — ABNORMAL LOW
GFR calc non Af Amer: 56 — ABNORMAL LOW
GFR calc non Af Amer: 59 — ABNORMAL LOW
GFR calc non Af Amer: >60
GFR calc non Af Amer: >60
Glucose, Bld: 115 — ABNORMAL HIGH
Glucose, Bld: 117 — ABNORMAL HIGH
Glucose, Bld: 120 — ABNORMAL HIGH
Glucose, Bld: 175 — ABNORMAL HIGH
Glucose, Bld: 176 — ABNORMAL HIGH
Potassium: 3.6
Potassium: 3.9
Potassium: 4.4
Potassium: 4.4
Potassium: 4.5
Sodium: 136
Sodium: 136
Sodium: 138
Sodium: 145
Sodium: 146 — ABNORMAL HIGH

## 2010-12-04 LAB — PROTIME-INR
INR: 1.1
Prothrombin Time: 14.9

## 2010-12-04 LAB — B-NATRIURETIC PEPTIDE (CONVERTED LAB)
Pro B Natriuretic peptide (BNP): 100
Pro B Natriuretic peptide (BNP): 179 — ABNORMAL HIGH
Pro B Natriuretic peptide (BNP): 255 — ABNORMAL HIGH

## 2010-12-04 LAB — HEMOGLOBIN A1C: Hgb A1c MFr Bld: 6.2 — ABNORMAL HIGH

## 2010-12-04 LAB — APTT: aPTT: 30

## 2010-12-05 LAB — BLOOD GAS, ARTERIAL
Acid-Base Excess: 1.4
Acid-Base Excess: 5.9 — ABNORMAL HIGH
Acid-base deficit: 3.6 — ABNORMAL HIGH
Acid-base deficit: 3.7 — ABNORMAL HIGH
Acid-base deficit: 4.1 — ABNORMAL HIGH
Acid-base deficit: 4.6 — ABNORMAL HIGH
Acid-base deficit: 5.1 — ABNORMAL HIGH
Acid-base deficit: 5.2 — ABNORMAL HIGH
Acid-base deficit: 5.5 — ABNORMAL HIGH
Acid-base deficit: 6.3 — ABNORMAL HIGH
Bicarbonate: 19 — ABNORMAL LOW
Bicarbonate: 19.2 — ABNORMAL LOW
Bicarbonate: 19.4 — ABNORMAL LOW
Bicarbonate: 19.8 — ABNORMAL LOW
Bicarbonate: 20
Bicarbonate: 20.1
Bicarbonate: 20.7
Bicarbonate: 21.8
Bicarbonate: 24.9 — ABNORMAL HIGH
Bicarbonate: 29.4 — ABNORMAL HIGH
Drawn by: 129711
FIO2: 0.1
FIO2: 0.1
FIO2: 0.4
FIO2: 0.4
FIO2: 0.4
FIO2: 0.4
FIO2: 0.6
FIO2: 0.9
FIO2: 1
FIO2: 1
MECHVT: 0.7
MECHVT: 600
MECHVT: 600
MECHVT: 650
MECHVT: 680
MECHVT: 700
MECHVT: 750
MECHVT: 750
MECHVT: 750
MECHVT: 750
O2 Saturation: 91.5
O2 Saturation: 92.7
O2 Saturation: 94.7
O2 Saturation: 94.9
O2 Saturation: 95.7
O2 Saturation: 96.7
O2 Saturation: 96.8
O2 Saturation: 97
O2 Saturation: 97.8
O2 Saturation: 99.6
PEEP: 0.5
PEEP: 10
PEEP: 10
PEEP: 10
PEEP: 10
PEEP: 5
PEEP: 7.5
PEEP: 7.5
PEEP: 7.5
PEEP: 7.5
Patient temperature: 100.4
Patient temperature: 103
Patient temperature: 37
Patient temperature: 37
Patient temperature: 37
Patient temperature: 39
Patient temperature: 39
Patient temperature: 98.3
Patient temperature: 98.6
Patient temperature: 98.6
RATE: 12
RATE: 24
RATE: 24
RATE: 24
RATE: 24
RATE: 24
RATE: 28
RATE: 28
RATE: 28
RATE: 30
TCO2: 19.9
TCO2: 20
TCO2: 20.3
TCO2: 20.5
TCO2: 20.9
TCO2: 21.1
TCO2: 21.1
TCO2: 21.2
TCO2: 26
TCO2: 30.6
pCO2 arterial: 27.8 — ABNORMAL LOW
pCO2 arterial: 31.6 — ABNORMAL LOW
pCO2 arterial: 31.7 — ABNORMAL LOW
pCO2 arterial: 33.9 — ABNORMAL LOW
pCO2 arterial: 35.6
pCO2 arterial: 37.5
pCO2 arterial: 38.7
pCO2 arterial: 41
pCO2 arterial: 42.7
pCO2 arterial: 45.6 — ABNORMAL HIGH
pH, Arterial: 7.259 — ABNORMAL LOW
pH, Arterial: 7.299 — ABNORMAL LOW
pH, Arterial: 7.322 — ABNORMAL LOW
pH, Arterial: 7.333 — ABNORMAL LOW
pH, Arterial: 7.393
pH, Arterial: 7.397
pH, Arterial: 7.419
pH, Arterial: 7.454 — ABNORMAL HIGH
pH, Arterial: 7.46 — ABNORMAL HIGH
pH, Arterial: 7.493 — ABNORMAL HIGH
pO2, Arterial: 113 — ABNORMAL HIGH
pO2, Arterial: 114 — ABNORMAL HIGH
pO2, Arterial: 195 — ABNORMAL HIGH
pO2, Arterial: 58.8 — ABNORMAL LOW
pO2, Arterial: 64.8 — ABNORMAL LOW
pO2, Arterial: 69.2 — ABNORMAL LOW
pO2, Arterial: 71.4 — ABNORMAL LOW
pO2, Arterial: 75.5 — ABNORMAL LOW
pO2, Arterial: 78.3 — ABNORMAL LOW
pO2, Arterial: 95.2

## 2010-12-05 LAB — CBC
HCT: 30.3 — ABNORMAL LOW
HCT: 30.4 — ABNORMAL LOW
HCT: 30.6 — ABNORMAL LOW
HCT: 30.8 — ABNORMAL LOW
HCT: 31 — ABNORMAL LOW
HCT: 32.4 — ABNORMAL LOW
HCT: 32.7 — ABNORMAL LOW
HCT: 35.9 — ABNORMAL LOW
HCT: 37.3 — ABNORMAL LOW
HCT: 39.1
HCT: 39.3
HCT: 40.3
HCT: 43.2
Hemoglobin: 10.1 — ABNORMAL LOW
Hemoglobin: 10.4 — ABNORMAL LOW
Hemoglobin: 10.4 — ABNORMAL LOW
Hemoglobin: 10.5 — ABNORMAL LOW
Hemoglobin: 10.5 — ABNORMAL LOW
Hemoglobin: 10.9 — ABNORMAL LOW
Hemoglobin: 10.9 — ABNORMAL LOW
Hemoglobin: 12.1 — ABNORMAL LOW
Hemoglobin: 12.4 — ABNORMAL LOW
Hemoglobin: 13.2
Hemoglobin: 13.3
Hemoglobin: 13.5
Hemoglobin: 14.7
MCHC: 33.1
MCHC: 33.3
MCHC: 33.5
MCHC: 33.5
MCHC: 33.6
MCHC: 33.7
MCHC: 33.8
MCHC: 33.8
MCHC: 33.9
MCHC: 33.9
MCHC: 34.1
MCHC: 34.2
MCHC: 34.3
MCV: 89.3
MCV: 89.8
MCV: 89.9
MCV: 90
MCV: 90.2
MCV: 90.6
MCV: 90.7
MCV: 90.9
MCV: 90.9
MCV: 91
MCV: 91.2
MCV: 91.3
MCV: 91.6
Platelets: 111 — ABNORMAL LOW
Platelets: 111 — ABNORMAL LOW
Platelets: 122 — ABNORMAL LOW
Platelets: 128 — ABNORMAL LOW
Platelets: 140 — ABNORMAL LOW
Platelets: 146 — ABNORMAL LOW
Platelets: 160
Platelets: 196
Platelets: 203
Platelets: 209
Platelets: 215
Platelets: 226
Platelets: 88 — ABNORMAL LOW
RBC: 3.36 — ABNORMAL LOW
RBC: 3.37 — ABNORMAL LOW
RBC: 3.38 — ABNORMAL LOW
RBC: 3.42 — ABNORMAL LOW
RBC: 3.44 — ABNORMAL LOW
RBC: 3.57 — ABNORMAL LOW
RBC: 3.58 — ABNORMAL LOW
RBC: 3.95 — ABNORMAL LOW
RBC: 4.07 — ABNORMAL LOW
RBC: 4.31
RBC: 4.33
RBC: 4.43
RBC: 4.84
RDW: 13
RDW: 13.5
RDW: 13.6
RDW: 13.6
RDW: 13.6
RDW: 13.7
RDW: 13.9
RDW: 14
RDW: 14.1 — ABNORMAL HIGH
RDW: 14.3 — ABNORMAL HIGH
RDW: 14.4 — ABNORMAL HIGH
RDW: 14.8 — ABNORMAL HIGH
RDW: 14.9 — ABNORMAL HIGH
WBC: 12.5 — ABNORMAL HIGH
WBC: 16 — ABNORMAL HIGH
WBC: 16.9 — ABNORMAL HIGH
WBC: 18.6 — ABNORMAL HIGH
WBC: 5.1
WBC: 6.3
WBC: 7.4
WBC: 7.5
WBC: 8.7
WBC: 8.8
WBC: 9.1
WBC: 9.1
WBC: 9.2

## 2010-12-05 LAB — BASIC METABOLIC PANEL
BUN: 15
BUN: 17
BUN: 18
BUN: 21
BUN: 23
BUN: 25 — ABNORMAL HIGH
BUN: 28 — ABNORMAL HIGH
BUN: 28 — ABNORMAL HIGH
BUN: 30 — ABNORMAL HIGH
CO2: 19
CO2: 20
CO2: 20
CO2: 21
CO2: 22
CO2: 23
CO2: 24
CO2: 26
CO2: 27
Calcium: 6.8 — ABNORMAL LOW
Calcium: 7 — ABNORMAL LOW
Calcium: 7.1 — ABNORMAL LOW
Calcium: 7.4 — ABNORMAL LOW
Calcium: 7.4 — ABNORMAL LOW
Calcium: 7.4 — ABNORMAL LOW
Calcium: 7.7 — ABNORMAL LOW
Calcium: 7.9 — ABNORMAL LOW
Calcium: 7.9 — ABNORMAL LOW
Chloride: 111
Chloride: 112
Chloride: 113 — ABNORMAL HIGH
Chloride: 115 — ABNORMAL HIGH
Chloride: 115 — ABNORMAL HIGH
Chloride: 116 — ABNORMAL HIGH
Chloride: 117 — ABNORMAL HIGH
Chloride: 119 — ABNORMAL HIGH
Chloride: 119 — ABNORMAL HIGH
Creatinine, Ser: 1.14
Creatinine, Ser: 1.27
Creatinine, Ser: 1.32
Creatinine, Ser: 1.35
Creatinine, Ser: 1.41
Creatinine, Ser: 1.43
Creatinine, Ser: 1.47
Creatinine, Ser: 1.52 — ABNORMAL HIGH
Creatinine, Ser: 1.6 — ABNORMAL HIGH
GFR calc Af Amer: 52 — ABNORMAL LOW
GFR calc Af Amer: 55 — ABNORMAL LOW
GFR calc Af Amer: 58 — ABNORMAL LOW
GFR calc Af Amer: 60 — ABNORMAL LOW
GFR calc Af Amer: >60
GFR calc Af Amer: >60
GFR calc Af Amer: >60
GFR calc Af Amer: >60
GFR calc Af Amer: >60
GFR calc non Af Amer: 43 — ABNORMAL LOW
GFR calc non Af Amer: 46 — ABNORMAL LOW
GFR calc non Af Amer: 48 — ABNORMAL LOW
GFR calc non Af Amer: 49 — ABNORMAL LOW
GFR calc non Af Amer: 50 — ABNORMAL LOW
GFR calc non Af Amer: 53 — ABNORMAL LOW
GFR calc non Af Amer: 54 — ABNORMAL LOW
GFR calc non Af Amer: 56 — ABNORMAL LOW
GFR calc non Af Amer: >60
Glucose, Bld: 102 — ABNORMAL HIGH
Glucose, Bld: 116 — ABNORMAL HIGH
Glucose, Bld: 118 — ABNORMAL HIGH
Glucose, Bld: 128 — ABNORMAL HIGH
Glucose, Bld: 155 — ABNORMAL HIGH
Glucose, Bld: 160 — ABNORMAL HIGH
Glucose, Bld: 162 — ABNORMAL HIGH
Glucose, Bld: 166 — ABNORMAL HIGH
Glucose, Bld: 189 — ABNORMAL HIGH
Potassium: 3 — ABNORMAL LOW
Potassium: 3 — ABNORMAL LOW
Potassium: 3.1 — ABNORMAL LOW
Potassium: 3.2 — ABNORMAL LOW
Potassium: 3.2 — ABNORMAL LOW
Potassium: 3.3 — ABNORMAL LOW
Potassium: 3.4 — ABNORMAL LOW
Potassium: 3.6
Potassium: 3.8
Sodium: 141
Sodium: 141
Sodium: 141
Sodium: 143
Sodium: 143
Sodium: 144
Sodium: 146 — ABNORMAL HIGH
Sodium: 147 — ABNORMAL HIGH
Sodium: 151 — ABNORMAL HIGH

## 2010-12-05 LAB — COMPREHENSIVE METABOLIC PANEL
ALT: 62 — ABNORMAL HIGH
AST: 57 — ABNORMAL HIGH
Albumin: 2.7 — ABNORMAL LOW
Alkaline Phosphatase: 37 — ABNORMAL LOW
BUN: 17
CO2: 20
Calcium: 7.3 — ABNORMAL LOW
Chloride: 115 — ABNORMAL HIGH
Creatinine, Ser: 1.59 — ABNORMAL HIGH
GFR calc Af Amer: 53 — ABNORMAL LOW
GFR calc non Af Amer: 44 — ABNORMAL LOW
Glucose, Bld: 165 — ABNORMAL HIGH
Potassium: 3.4 — ABNORMAL LOW
Sodium: 142
Total Bilirubin: 0.9
Total Protein: 4.9 — ABNORMAL LOW

## 2010-12-05 LAB — CULTURE, BAL-QUANTITATIVE W GRAM STAIN: Colony Count: >=100000

## 2010-12-05 LAB — POCT I-STAT 3, ART BLOOD GAS (G3+)
Acid-base deficit: 5 — ABNORMAL HIGH
Bicarbonate: 21.5
O2 Saturation: 99
Operator id: 287601
Patient temperature: 37
TCO2: 23
pCO2 arterial: 43.7
pH, Arterial: 7.301 — ABNORMAL LOW
pO2, Arterial: 144 — ABNORMAL HIGH

## 2010-12-05 LAB — CARDIAC PANEL(CRET KIN+CKTOT+MB+TROPI)
CK, MB: 1.6
CK, MB: 1.7
CK, MB: 1.9
CK, MB: 11.5 — ABNORMAL HIGH
CK, MB: 2
CK, MB: 2.3
CK, MB: 3.1
CK, MB: 3.3
CK, MB: 5.2 — ABNORMAL HIGH
Relative Index: 0.9
Relative Index: 0.9
Relative Index: 1
Relative Index: 1.1
Relative Index: 1.2
Relative Index: 1.6
Relative Index: 1.6
Relative Index: 1.8
Relative Index: 2.3
Total CK: 100
Total CK: 122
Total CK: 143
Total CK: 164
Total CK: 217
Total CK: 217
Total CK: 339 — ABNORMAL HIGH
Total CK: 445 — ABNORMAL HIGH
Total CK: 631 — ABNORMAL HIGH
Troponin I: 0.05
Troponin I: 0.11 — ABNORMAL HIGH
Troponin I: 0.14 — ABNORMAL HIGH
Troponin I: 0.16 — ABNORMAL HIGH
Troponin I: 0.17 — ABNORMAL HIGH
Troponin I: 0.31 — ABNORMAL HIGH
Troponin I: 0.58
Troponin I: 1.11
Troponin I: 1.44

## 2010-12-05 LAB — DIFFERENTIAL
Basophils Absolute: 0
Basophils Relative: 0
Eosinophils Absolute: 0
Eosinophils Relative: 0
Lymphocytes Relative: 11 — ABNORMAL LOW
Lymphs Abs: 2
Monocytes Absolute: 0.4
Monocytes Relative: 2 — ABNORMAL LOW
Neutro Abs: 16.2 — ABNORMAL HIGH
Neutrophils Relative %: 87 — ABNORMAL HIGH

## 2010-12-05 LAB — DIC (DISSEMINATED INTRAVASCULAR COAGULATION)PANEL
D-Dimer, Quant: >20 — ABNORMAL HIGH
Fibrinogen: 304
INR: 1.4
Platelets: 110 — ABNORMAL LOW
Prothrombin Time: 17.3 — ABNORMAL HIGH
Smear Review: NONE SEEN
aPTT: 31

## 2010-12-05 LAB — I-STAT 8, (EC8 V) (CONVERTED LAB)
Acid-base deficit: 5 — ABNORMAL HIGH
BUN: 17
Bicarbonate: 21.2
Chloride: 111
Glucose, Bld: 190 — ABNORMAL HIGH
HCT: 42
Hemoglobin: 14.3
Operator id: 189501
Potassium: 3 — ABNORMAL LOW
Sodium: 145
TCO2: 23
pCO2, Ven: 44.2 — ABNORMAL LOW
pH, Ven: 7.289

## 2010-12-05 LAB — CULTURE, BLOOD (ROUTINE X 2)
Culture: NO GROWTH
Culture: NO GROWTH

## 2010-12-05 LAB — LIPID PANEL
Cholesterol: 62
HDL: 23 — ABNORMAL LOW
LDL Cholesterol: 32
Total CHOL/HDL Ratio: 2.7
Triglycerides: 34
VLDL: 7

## 2010-12-05 LAB — HEPARIN LEVEL (UNFRACTIONATED)
Heparin Unfractionated: 0.13 — ABNORMAL LOW
Heparin Unfractionated: 0.24 — ABNORMAL LOW
Heparin Unfractionated: 0.27 — ABNORMAL LOW
Heparin Unfractionated: 0.3
Heparin Unfractionated: 0.3
Heparin Unfractionated: 0.38
Heparin Unfractionated: 0.89 — ABNORMAL HIGH
Heparin Unfractionated: 0.91 — ABNORMAL HIGH

## 2010-12-05 LAB — TROPONIN I: Troponin I: 0.19 — ABNORMAL HIGH

## 2010-12-05 LAB — POCT CARDIAC MARKERS
CKMB, poc: 4
Myoglobin, poc: 401
Operator id: 189501
Troponin i, poc: 0.08 — ABNORMAL HIGH

## 2010-12-05 LAB — CK TOTAL AND CKMB (NOT AT ARMC)
CK, MB: 9.5 — ABNORMAL HIGH
Relative Index: 1.5
Total CK: 617 — ABNORMAL HIGH

## 2010-12-05 LAB — B-NATRIURETIC PEPTIDE (CONVERTED LAB)
Pro B Natriuretic peptide (BNP): 162 — ABNORMAL HIGH
Pro B Natriuretic peptide (BNP): 425 — ABNORMAL HIGH
Pro B Natriuretic peptide (BNP): 58
Pro B Natriuretic peptide (BNP): 594 — ABNORMAL HIGH

## 2010-12-05 LAB — APTT
aPTT: 22 — ABNORMAL LOW
aPTT: 77 — ABNORMAL HIGH

## 2010-12-05 LAB — PROTIME-INR
INR: 1.1
Prothrombin Time: 14.3

## 2010-12-05 LAB — MAGNESIUM: Magnesium: 2

## 2010-12-05 LAB — POCT I-STAT CREATININE
Creatinine, Ser: 1.2
Operator id: 189501

## 2010-12-05 LAB — TSH: TSH: 4.497

## 2010-12-05 LAB — HEPARIN ANTIBODY SCREEN: Heparin Antibody Screen: NEGATIVE

## 2010-12-19 ENCOUNTER — Ambulatory Visit (INDEPENDENT_AMBULATORY_CARE_PROVIDER_SITE_OTHER): Payer: Medicare Other | Admitting: *Deleted

## 2010-12-19 DIAGNOSIS — Z95810 Presence of automatic (implantable) cardiac defibrillator: Secondary | ICD-10-CM

## 2010-12-19 DIAGNOSIS — I469 Cardiac arrest, cause unspecified: Secondary | ICD-10-CM

## 2010-12-20 ENCOUNTER — Other Ambulatory Visit: Payer: Self-pay | Admitting: Internal Medicine

## 2010-12-20 ENCOUNTER — Encounter: Payer: Self-pay | Admitting: Internal Medicine

## 2010-12-20 LAB — REMOTE ICD DEVICE
BATTERY VOLTAGE: 2.93 V
BRDY-0002RV: 40 {beats}/min
DEV-0020ICD: NEGATIVE
DEVICE MODEL ICD: 449388
HV IMPEDENCE: 52 Ohm
RV LEAD AMPLITUDE: 10.1 mv
RV LEAD IMPEDENCE ICD: 580 Ohm
TZAT-0001FASTVT: 1
TZAT-0001SLOWVT: 1
TZAT-0004FASTVT: 8
TZAT-0004SLOWVT: 8
TZAT-0012FASTVT: 200 ms
TZAT-0012SLOWVT: 200 ms
TZAT-0013FASTVT: 1
TZAT-0013SLOWVT: 4
TZAT-0018FASTVT: NEGATIVE
TZAT-0018SLOWVT: NEGATIVE
TZAT-0019FASTVT: 7.5 V
TZAT-0019SLOWVT: 7.5 V
TZAT-0020FASTVT: 1 ms
TZAT-0020SLOWVT: 1 ms
TZON-0003FASTVT: 300 ms
TZON-0003SLOWVT: 340 ms
TZON-0004FASTVT: 12
TZON-0004SLOWVT: 24
TZON-0005FASTVT: 6
TZON-0005SLOWVT: 6
TZON-0010FASTVT: 80 ms
TZON-0010SLOWVT: 80 ms
TZST-0001FASTVT: 2
TZST-0001FASTVT: 3
TZST-0001FASTVT: 4
TZST-0001FASTVT: 5
TZST-0001SLOWVT: 2
TZST-0001SLOWVT: 3
TZST-0001SLOWVT: 4
TZST-0001SLOWVT: 5
TZST-0003FASTVT: 36 J
TZST-0003FASTVT: 36 J
TZST-0003FASTVT: 36 J
TZST-0003FASTVT: 36 J
TZST-0003SLOWVT: 25 J
TZST-0003SLOWVT: 36 J
TZST-0003SLOWVT: 36 J
TZST-0003SLOWVT: 36 J
VENTRICULAR PACING ICD: 1 pct

## 2010-12-26 ENCOUNTER — Encounter: Payer: Self-pay | Admitting: *Deleted

## 2011-01-03 NOTE — Progress Notes (Signed)
icd remote check  

## 2011-03-21 ENCOUNTER — Encounter: Payer: Self-pay | Admitting: Family Medicine

## 2011-03-21 ENCOUNTER — Ambulatory Visit (INDEPENDENT_AMBULATORY_CARE_PROVIDER_SITE_OTHER): Payer: Medicare Other | Admitting: Family Medicine

## 2011-03-21 DIAGNOSIS — I872 Venous insufficiency (chronic) (peripheral): Secondary | ICD-10-CM

## 2011-03-21 DIAGNOSIS — E119 Type 2 diabetes mellitus without complications: Secondary | ICD-10-CM

## 2011-03-21 DIAGNOSIS — I2510 Atherosclerotic heart disease of native coronary artery without angina pectoris: Secondary | ICD-10-CM | POA: Diagnosis not present

## 2011-03-21 DIAGNOSIS — E785 Hyperlipidemia, unspecified: Secondary | ICD-10-CM | POA: Diagnosis not present

## 2011-03-21 DIAGNOSIS — M19049 Primary osteoarthritis, unspecified hand: Secondary | ICD-10-CM

## 2011-03-21 DIAGNOSIS — Z8674 Personal history of sudden cardiac arrest: Secondary | ICD-10-CM

## 2011-03-21 DIAGNOSIS — Z0000 Encounter for general adult medical examination without abnormal findings: Secondary | ICD-10-CM | POA: Diagnosis not present

## 2011-03-21 DIAGNOSIS — Z125 Encounter for screening for malignant neoplasm of prostate: Secondary | ICD-10-CM | POA: Diagnosis not present

## 2011-03-21 MED ORDER — SIMVASTATIN 40 MG PO TABS: 40.0000 mg | ORAL_TABLET | Freq: Every evening | ORAL | Status: DC

## 2011-03-21 MED ORDER — PANTOPRAZOLE SODIUM 40 MG PO TBEC: 40.0000 mg | DELAYED_RELEASE_TABLET | Freq: Every day | ORAL | Status: DC

## 2011-03-21 MED ORDER — LISINOPRIL 5 MG PO TABS: 5.0000 mg | ORAL_TABLET | Freq: Every day | ORAL | Status: DC

## 2011-03-21 MED ORDER — METOPROLOL SUCCINATE ER 50 MG PO TB24: ORAL_TABLET | ORAL | Status: DC

## 2011-03-21 NOTE — Progress Notes (Signed)
Office Note 03/24/2011  CC:  Chief Complaint  Patient presents with  . Establish Care    transfer from Dr. Scotty Court    HPI:  [name] is a 76 y.o. White male who is here to establish care as a transfer from Dr. Scotty Court, who retired recently. Old records in EPIC/HL were reviewed prior to or during today's visit.  Last CPE was about a year ago.  Has been feeling fine.  No limitations in his activities, works at [employer], bathrooms, kitchens.   Complains of pain and stiffness in only one area: 1st nuckle on right hand middle finger x years.  No redness or tenderness but he says it sometimes swells.  He denies any hx of injury, says it is worse with excessive use, better with rest and sometimes OTC pain med. No wrist pains, no other fingers hurt, no tingling or numbness in hands.  Also has long hx of bilat LE edema, L>R, worse towards end of day esp if he's been standing a lot. Has hyperpigmented skin changes associated with this.  No itching or pain.  His lower legs feel sensitive when something knocks into them.  No recent acute change in his LE swelling.  No thigh or calf pain, no hx of DVT or PE.  Takes no diuretic, wears no compression hose, rarely elevates legs, watches added salt to diet but doesn't watch closely for low sodium foods.     Past Medical History  Diagnosis Date  . CAD (coronary artery disease)     CABG 1996  . Cardiac arrest 2008    AICD in right ventricle --placed afterward  . Cardiomyopathy, ischemic   . Unspecified arthropathy, shoulder region   . Disorder of bone and cartilage, unspecified   . Anemia, unspecified   . OSA on CPAP     sleep study 2008, severe OSA  . Hypercholesterolemia   . GERD (gastroesophageal reflux disease)     Past Surgical History  Procedure Date  . Coronary artery bypass graft 1996    3 vessel  . Pacemaker placement 07/2006    St Jude current VRRF    Family History  Problem Relation Age of Onset  . Heart  disease Mother   . Stroke Mother   . Heart disease Father     History   Social History  . Marital Status: Married    Spouse Name: N/A    Number of Children: N/A  . Years of Education: N/A   Occupational History  . Not on file.   Social History Main Topics  . Smoking status: Former Smoker    Types: Cigarettes    Quit date: 02/17/1970  . Smokeless tobacco: Never Used  . Alcohol Use: 4.2 oz/week    7 Glasses of wine per week  . Drug Use: No  . Sexually Active: Not on file   Other Topics Concern  . 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  Drinks one glass of wind daily.No drug use.    Outpatient Encounter Prescriptions as of 03/21/2011  Medication Sig Dispense Refill  . aspirin 325 MG EC tablet Take 325 mg by mouth daily.      [name] b complex vitamins tablet Take 1 tablet by mouth daily.      . Calcium Carbonate (CALCIUM 500 PO) Take 1 tablet by  mouth daily.      . Coenzyme Q10 100 MG CHEW Chew by mouth daily.      [name] DIGESTIVE ENZYMES PO Take 1 tablet by mouth daily.      [name] KLOR-CON M20 20 MEQ tablet Take 1 tablet by mouth Daily.      [name] lisinopril (PRINIVIL,ZESTRIL) 5 MG tablet Take 1 tablet (5 mg total) by mouth daily.  90 tablet  1  . metoprolol succinate (TOPROL-XL) 50 MG 24 hr tablet Take 1/2 tablet by mouth once daily  90 tablet  1  . Multiple Vitamin (MULTIVITAMIN) tablet Take 1 tablet by mouth daily.      . NON FORMULARY OPC 3.  Take 1 tablet by mouth daily.      . NON FORMULARY GLUCOSTATIN. Once daily.      . Omega-3 Fatty Acids (FISH OIL) 1000 MG CPDR Take 1 capsule by mouth daily.      . pantoprazole (PROTONIX) 40 MG tablet Take 1 tablet (40 mg total) by mouth daily.  90 tablet  1  . simvastatin (ZOCOR) 40 MG tablet Take 1 tablet (40 mg total) by mouth every evening.  90  tablet  1  . VIAGRA 100 MG tablet Take 1 tablet by mouth as needed.      [name] VITAMIN D-VITAMIN K PO Take 1 tablet by mouth daily.      [name] DISCONTD: lisinopril (PRINIVIL,ZESTRIL) 10 MG tablet Take 5 mg by mouth Daily.      [name] DISCONTD: lisinopril (PRINIVIL,ZESTRIL) 5 MG tablet Take 5 mg by mouth daily.      [name] DISCONTD: metoprolol (LOPRESSOR) 50 MG tablet Take 50 mg by mouth 2 (two) times daily. 1/2 tab po qd      . DISCONTD: metoprolol succinate (TOPROL-XL) 50 MG 24 hr tablet Take 25 mg by mouth Twice daily.      [name] DISCONTD: metoprolol succinate (TOPROL-XL) 50 MG 24 hr tablet Take 1/2 tablet by mouth once daily      . DISCONTD: pantoprazole (PROTONIX) 40 MG tablet Take 1 capsule by mouth Daily.      [name] DISCONTD: simvastatin (ZOCOR) 40 MG tablet Take 40 mg by mouth every evening.        Allergies  Allergen Reactions  . Aleve Nausea Only    ROS Review of Systems  Constitutional: Negative for fever, chills, appetite change and fatigue.  HENT: Positive for hearing loss (chronic, has new hearing aids bilat). Negative for ear pain, congestion, sore throat, neck stiffness, dental problem and sinus pressure.   Eyes: Negative for discharge, redness and visual disturbance.  Respiratory: Negative for cough, chest tightness, shortness of breath and wheezing.   Cardiovascular: Negative for chest pain and palpitations. Leg swelling: chronic LE edema as noted in HPI.  Gastrointestinal: Negative for nausea, vomiting, abdominal pain, diarrhea and blood in stool.  Genitourinary: Negative for dysuria, urgency, frequency, hematuria, flank pain and difficulty urinating.  Musculoskeletal: Negative for myalgias, back pain, joint swelling and arthralgias.  Skin: Negative for pallor and rash.  Neurological: Negative for dizziness, tremors, speech difficulty, weakness and headaches.  Hematological: Negative for adenopathy. Does not bruise/bleed easily.  Psychiatric/Behavioral: Negative for confusion and sleep  disturbance. The patient is not nervous/anxious.     PE; Blood pressure 123/75, pulse 57, temperature 98.2 F (36.8 C), temperature source Temporal, height 5' 10.5" (1.791 m), weight 236 lb (107.049 kg), SpO2 94.00%. Gen: Alert, well appearing, overweight white male.  Patient is oriented to person, place, time, and situation. Pleasant  affect, lucid thought and speech. ENT: Ears: EACs clear, normal epithelium.  TMs with good light reflex and landmarks bilaterally.  Eyes: no injection, icteris, swelling, or exudate.  EOMI, PERRLA. Nose: no drainage or turbinate edema/swelling.  No injection or focal lesion.  Mouth: lips without lesion/swelling.  Oral mucosa pink and moist.  Dentition intact and without obvious caries or gingival swelling.  Oropharynx without erythema, exudate, or swelling.  Neck - No masses or thyromegaly or limitation in range of motion RRR, without murmur, rub, or gallop. Carotid pulses easily palpable, symmetric pulsations, no bruit or palpable dilatation. Radial, femoral, and ankle pulses easily palpable and symmetric. No abdominal bruit. No varicosities.  Cap refill in extremities is brisk. Chest with symmetric expansion, good aeration, nonlabored respirations.  Clear and equal breath sounds in all lung fields.  No clubbing or cyanosis. ABD: soft, NT, ND, BS normal.  No hepatospenomegaly or mass.  No bruits. EXT: no cyanosis or edema.  2+ pitting edema in both LE's from knees down into dorsum of feet. Vein harvest scar on right leg.  ROM of knees and ankles normal.  Hemosiderin skin changes diffusely on both lower legs, primarily anteriorly.  No skin tension or breakdown or erythema.  Minimal areas of hyperkeratosis of skin in the lower legs.    Pertinent labs:  None today  ASSESSMENT AND PLAN:   Transfer pt:   History of cardiac arrest Has ICD, last f/u with Dr. Graciela Husbands was 03/2010 so he's due for routine f/u in his office for check of his device and his  cardiomyopathy.  Health maintenance examination Reviewed age and gender appropriate health maintenance issues (prudent diet, regular exercise, health risks of tobacco and excessive alcohol, use of seatbelts, fire alarms in home, use of sunscreen).  Also reviewed age and gender appropriate health screening as well as vaccine recommendations. He is UTD on all recommended vacc's (he reports he has had flu this year through [place], says he has had pneumovax and zostavax in the past but he can't recall dates.  Unsure of tetanus--will try to clarify with search of records. Prostate cancer screening done today: DRE normal.  PSA to be done ASAP with other labs (lipids, CMET, TSH, CBC) when he returns fasting. Colon cancer screening: reports having had colonoscopy with "polyps that were fine" >5 yrs ago by Victorino Dike.  Will search for record of this to determine further screening needs. Encouraged more exercise: he says he'll begin regular walking routine.  Chronic venous insufficiency Stable.  No need for diuretic or compression hose at this time. Emphasized low Na diet, elevation of legs regularly, increase ambulation, watch for acute changes that may signify skin infection, breakdown, or DVT.  Osteoarthritis of finger MTP joint on middle finger of right hand, mild/mod. Discussed tylenol prn, continue normal activity, add topical compounded anti-inflammitory med to use qid.    RF'd all meds today except zocor.  Will see what lipid panel shows to make sure no dose adjustment is needed, then will send in 90d rx.  Return in about 6 months (around 09/18/2011) for routine f/u.

## 2011-03-21 NOTE — Assessment & Plan Note (Signed)
Has ICD, last f/u with Dr. Graciela Husbands was 03/2010 so he's due for routine f/u in his office for check of his device and his cardiomyopathy.

## 2011-03-24 ENCOUNTER — Other Ambulatory Visit (INDEPENDENT_AMBULATORY_CARE_PROVIDER_SITE_OTHER): Payer: Medicare Other

## 2011-03-24 ENCOUNTER — Encounter: Payer: Self-pay | Admitting: Family Medicine

## 2011-03-24 DIAGNOSIS — Z0000 Encounter for general adult medical examination without abnormal findings: Secondary | ICD-10-CM | POA: Insufficient documentation

## 2011-03-24 DIAGNOSIS — I872 Venous insufficiency (chronic) (peripheral): Secondary | ICD-10-CM | POA: Insufficient documentation

## 2011-03-24 DIAGNOSIS — Z125 Encounter for screening for malignant neoplasm of prostate: Secondary | ICD-10-CM

## 2011-03-24 DIAGNOSIS — E785 Hyperlipidemia, unspecified: Secondary | ICD-10-CM

## 2011-03-24 DIAGNOSIS — I2510 Atherosclerotic heart disease of native coronary artery without angina pectoris: Secondary | ICD-10-CM

## 2011-03-24 DIAGNOSIS — M19049 Primary osteoarthritis, unspecified hand: Secondary | ICD-10-CM | POA: Insufficient documentation

## 2011-03-24 LAB — CBC WITH DIFFERENTIAL/PLATELET
Basophils Absolute: 0 10*3/uL (ref 0.0–0.1)
Basophils Relative: 0.7 % (ref 0.0–3.0)
Eosinophils Absolute: 0.1 10*3/uL (ref 0.0–0.7)
Eosinophils Relative: 2.1 % (ref 0.0–5.0)
HCT: 43.1 % (ref 39.0–52.0)
Hemoglobin: 14.4 g/dL (ref 13.0–17.0)
Lymphocytes Relative: 34.3 % (ref 12.0–46.0)
Lymphs Abs: 2.2 10*3/uL (ref 0.7–4.0)
MCHC: 33.5 g/dL (ref 30.0–36.0)
MCV: 91.8 fl (ref 78.0–100.0)
Monocytes Absolute: 0.7 10*3/uL (ref 0.1–1.0)
Monocytes Relative: 10.2 % (ref 3.0–12.0)
Neutro Abs: 3.4 10*3/uL (ref 1.4–7.7)
Neutrophils Relative %: 52.7 % (ref 43.0–77.0)
Platelets: 172 10*3/uL (ref 150.0–400.0)
RBC: 4.7 Mil/uL (ref 4.22–5.81)
RDW: 13.5 % (ref 11.5–14.6)
WBC: 6.4 10*3/uL (ref 4.5–10.5)

## 2011-03-24 LAB — COMPREHENSIVE METABOLIC PANEL
ALT: 25 U/L (ref 0–53)
AST: 24 U/L (ref 0–37)
Albumin: 4.2 g/dL (ref 3.5–5.2)
Alkaline Phosphatase: 40 U/L (ref 39–117)
BUN: 24 mg/dL — ABNORMAL HIGH (ref 6–23)
CO2: 30 mEq/L (ref 19–32)
Calcium: 9.1 mg/dL (ref 8.4–10.5)
Chloride: 106 mEq/L (ref 96–112)
Creatinine, Ser: 1.5 mg/dL (ref 0.4–1.5)
GFR: 50.28 mL/min — ABNORMAL LOW (ref >60.00)
Glucose, Bld: 122 mg/dL — ABNORMAL HIGH (ref 70–99)
Potassium: 4.9 mEq/L (ref 3.5–5.1)
Sodium: 141 mEq/L (ref 135–145)
Total Bilirubin: 0.3 mg/dL (ref 0.3–1.2)
Total Protein: 7.3 g/dL (ref 6.0–8.3)

## 2011-03-24 LAB — LIPID PANEL
Cholesterol: 171 mg/dL (ref 0–200)
HDL: 37.8 mg/dL — ABNORMAL LOW (ref >39.00)
LDL Cholesterol: 117 mg/dL — ABNORMAL HIGH (ref 0–99)
Total CHOL/HDL Ratio: 5
Triglycerides: 80 mg/dL (ref 0.0–149.0)
VLDL: 16 mg/dL (ref 0.0–40.0)

## 2011-03-24 NOTE — Assessment & Plan Note (Signed)
Stable.  No need for diuretic or compression hose at this time. Emphasized low Na diet, elevation of legs regularly, increase ambulation, watch for acute changes that may signify skin infection, breakdown, or DVT.

## 2011-03-24 NOTE — Assessment & Plan Note (Signed)
MTP joint on middle finger of right hand, mild/mod. Discussed tylenol prn, continue normal activity, add topical compounded anti-inflammitory med to use qid.

## 2011-03-24 NOTE — Assessment & Plan Note (Signed)
Reviewed age and gender appropriate health maintenance issues (prudent diet, regular exercise, health risks of tobacco and excessive alcohol, use of seatbelts, fire alarms in home, use of sunscreen).  Also reviewed age and gender appropriate health screening as well as vaccine recommendations. He is UTD on all recommended vacc's (he reports he has had flu this year through [place], says he has had pneumovax and zostavax in the past but he can't recall dates.  Unsure of tetanus--will try to clarify with search of records. Prostate cancer screening done today: DRE normal.  PSA to be done ASAP with other labs (lipids, CMET, TSH, CBC) when he returns fasting. Colon cancer screening: reports having had colonoscopy with "polyps that were fine" >5 yrs ago by Victorino Dike.  Will search for record of this to determine further screening needs. Encouraged more exercise: he says he'll begin regular walking routine.

## 2011-03-25 ENCOUNTER — Encounter: Payer: Self-pay | Admitting: Family Medicine

## 2011-03-25 ENCOUNTER — Telehealth: Payer: Self-pay | Admitting: Family Medicine

## 2011-03-25 DIAGNOSIS — R7301 Impaired fasting glucose: Secondary | ICD-10-CM | POA: Insufficient documentation

## 2011-03-25 LAB — PSA, MEDICARE: PSA: 0.43 ng/mL (ref <=4.00)

## 2011-03-25 NOTE — Telephone Encounter (Signed)
Please call pt and notify him that we tracked down his colonoscopy records and he is overdue for repeat colonoscopy. Tell him we'll arrange f/u visit for this with [facility].

## 2011-03-25 NOTE — Telephone Encounter (Signed)
I have attempted to contact this patient by phone with the following results: left message to return my call on answering machine (home).  

## 2011-03-25 NOTE — Telephone Encounter (Signed)
Message copied by Andrew Au on Tue Mar 25, 2011  1:50 PM ------      Message from: Luisa Dago      Created: Tue Mar 25, 2011  9:33 AM       Pt had colon with Dr. Doreatha Martin on 01/07/02 with polyps.  Recall entered for 2006, but no follow up appt scheduled.      steph

## 2011-03-26 ENCOUNTER — Encounter: Payer: Self-pay | Admitting: Family Medicine

## 2011-03-26 LAB — HEMOGLOBIN A1C: Hgb A1c MFr Bld: 6.7 % — ABNORMAL HIGH (ref 4.6–6.5)

## 2011-03-26 NOTE — Progress Notes (Signed)
Quick Note:  Pls notify pt that all labs earlier this week were normal except his sugar. He has very early/mild diabetes. At this point I don't recommend medication but he needs to go to see a nutritionist to learn how to control things the best he can with diabetic diet, plus he needs to start exercising. Start with 15 min of brisk walking a few days a week and work up to 30 min 5 out of 7 days a week. Have him come by for a nurse visit to get a glucometer and teach him how to check his glucose. Check fasting glucose 3 days per week and check a glucose 2 hours after any meal a few times per week. Write the numbers down/store in glucometer and make appt to come in for o/v with me to review/discuss in 1 month.  Continue all current meds at current doses. thx ______

## 2011-03-26 NOTE — Progress Notes (Signed)
Addended by: Andrew Au on: 03/26/2011 10:57 PM   Modules accepted: Orders

## 2011-04-01 ENCOUNTER — Other Ambulatory Visit: Payer: Self-pay | Admitting: Family Medicine

## 2011-04-01 DIAGNOSIS — Z86010 Personal history of colonic polyps: Secondary | ICD-10-CM

## 2011-04-01 NOTE — Telephone Encounter (Signed)
Pt agreeable.  Any Friday morning OK.

## 2011-04-01 NOTE — Telephone Encounter (Signed)
Please enter order.

## 2011-04-01 NOTE — Telephone Encounter (Signed)
2 calls to patient on 03/25/11 to discuss this and lab results. Phone disconnects while we are talking . I will try again tomorrow. Pt states he already has nutrition class appt. I have attempted to contact this patient by phone with the following results: left message to return my call on answering machine (home).

## 2011-04-04 ENCOUNTER — Ambulatory Visit (INDEPENDENT_AMBULATORY_CARE_PROVIDER_SITE_OTHER): Payer: Medicare Other | Admitting: Family Medicine

## 2011-04-04 DIAGNOSIS — E119 Type 2 diabetes mellitus without complications: Secondary | ICD-10-CM

## 2011-04-04 MED ORDER — FREESTYLE LANCETS MISC: Status: DC

## 2011-04-04 MED ORDER — GLUCOSE BLOOD VI STRP: ORAL_STRIP | Status: DC

## 2011-04-04 NOTE — Progress Notes (Signed)
Pt given Freestyle Freedom Lite meter and instructed on use.  Reviewed frequency of checking sugars and importance of writing them down or storing in memory.  Pt asked appropriate questions and these were answered.  RX for lancets and strips sent to pharmacy.

## 2011-04-13 ENCOUNTER — Other Ambulatory Visit: Payer: Self-pay | Admitting: Family Medicine

## 2011-04-15 ENCOUNTER — Encounter: Payer: Medicare Other | Attending: Family Medicine | Admitting: *Deleted

## 2011-04-15 ENCOUNTER — Ambulatory Visit (INDEPENDENT_AMBULATORY_CARE_PROVIDER_SITE_OTHER): Payer: Medicare Other | Admitting: Internal Medicine

## 2011-04-15 ENCOUNTER — Encounter: Payer: Self-pay | Admitting: Internal Medicine

## 2011-04-15 ENCOUNTER — Encounter: Payer: Self-pay | Admitting: *Deleted

## 2011-04-15 VITALS — BP 118/69 | HR 57 | Ht 72.0 in | Wt 237.8 lb

## 2011-04-15 DIAGNOSIS — Z95810 Presence of automatic (implantable) cardiac defibrillator: Secondary | ICD-10-CM

## 2011-04-15 DIAGNOSIS — R7301 Impaired fasting glucose: Secondary | ICD-10-CM

## 2011-04-15 DIAGNOSIS — E785 Hyperlipidemia, unspecified: Secondary | ICD-10-CM | POA: Insufficient documentation

## 2011-04-15 DIAGNOSIS — Z8674 Personal history of sudden cardiac arrest: Secondary | ICD-10-CM

## 2011-04-15 DIAGNOSIS — I469 Cardiac arrest, cause unspecified: Secondary | ICD-10-CM | POA: Diagnosis not present

## 2011-04-15 DIAGNOSIS — Z713 Dietary counseling and surveillance: Secondary | ICD-10-CM | POA: Diagnosis not present

## 2011-04-15 DIAGNOSIS — I2510 Atherosclerotic heart disease of native coronary artery without angina pectoris: Secondary | ICD-10-CM

## 2011-04-15 LAB — ICD DEVICE OBSERVATION
BATTERY VOLTAGE: 2.8877 V
BRDY-0002RV: 40 {beats}/min
CHARGE TIME: 11.4 s
DEV-0020ICD: NEGATIVE
DEVICE MODEL ICD: 449388
FVT: 0
HV IMPEDENCE: 48 Ohm
PACEART VT: 0
RV LEAD AMPLITUDE: 10.9 mv
RV LEAD IMPEDENCE ICD: 637.5 Ohm
RV LEAD THRESHOLD: 1.25 V
TOT-0007: 3
TOT-0008: 0
TOT-0009: 2
TOT-0010: 20
TZAT-0001FASTVT: 1
TZAT-0001SLOWVT: 1
TZAT-0004FASTVT: 8
TZAT-0004SLOWVT: 8
TZAT-0012FASTVT: 200 ms
TZAT-0012SLOWVT: 200 ms
TZAT-0013FASTVT: 1
TZAT-0013SLOWVT: 4
TZAT-0018FASTVT: NEGATIVE
TZAT-0018SLOWVT: NEGATIVE
TZAT-0019FASTVT: 7.5 V
TZAT-0019SLOWVT: 7.5 V
TZAT-0020FASTVT: 1 ms
TZAT-0020SLOWVT: 1 ms
TZON-0003FASTVT: 300 ms
TZON-0003SLOWVT: 340 ms
TZON-0004FASTVT: 12
TZON-0004SLOWVT: 24
TZON-0005FASTVT: 6
TZON-0005SLOWVT: 6
TZON-0010FASTVT: 80 ms
TZON-0010SLOWVT: 80 ms
TZST-0001FASTVT: 2
TZST-0001FASTVT: 3
TZST-0001FASTVT: 4
TZST-0001FASTVT: 5
TZST-0001SLOWVT: 2
TZST-0001SLOWVT: 3
TZST-0001SLOWVT: 4
TZST-0001SLOWVT: 5
TZST-0003FASTVT: 36 J
TZST-0003FASTVT: 36 J
TZST-0003FASTVT: 36 J
TZST-0003FASTVT: 36 J
TZST-0003SLOWVT: 25 J
TZST-0003SLOWVT: 36 J
TZST-0003SLOWVT: 36 J
TZST-0003SLOWVT: 36 J
VENTRICULAR PACING ICD: 0 pct
VF: 0

## 2011-04-15 NOTE — Assessment & Plan Note (Signed)
We discussed the importance of exercise as well as dietary considerations with elimination of white flour

## 2011-04-15 NOTE — Progress Notes (Signed)
  Medical Nutrition Therapy:  Appt start time: 0830 end time:  0930.  Assessment:  Primary concerns today: Patient here for pre-diabetes and weight management. He commented that their 74 year old grandson who lives in [place] was just diagnosed with Type 1 Diabetes. 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   MEDICATIONS: see list   DIETARY INTAKE:  Usual eating pattern includes 3 meals and 1 snack per day.  Everyday foods include good variety of all food groups.  Avoided foods include regular and sweetened beverages.    24-hr recall:  B ( AM): scrambled eggs with peppers and cheese, OR 2-3 medium pancakes OR muffin or coffee cake Or toast with muenster cheese AND fresh fruit, coffee with sweetener Snk ( AM): none  L ( PM): skip often at work, sandwich, chips, Fresca  Snk ( PM): none D ( PM): meat - mostly fish or chicken, starches, vegetables (1 plate), salad Snk ( PM): ice cream, or Klondike bar OR sour dough pretzels Beverages: water, coffee, Fresca, wine, occasionally beer,   Usual physical activity: active with work, walk dog 3 times a day 1/4 mile each  Estimated energy needs: 1600 calories 180 g carbohydrates 120 g protein 44 g fat  Progress Towards Goal(s):  In progress.   Nutritional Diagnosis:  NI-1.5 Excessive energy intake As related to obesity.  As evidenced by BMI of 32.3%.    Intervention:  Nutrition counseling and diabetes education provided. Instructed on basic physiology of diabetes, action of Metform, symptoms of high and low BG, Carb Counting and label reading and rationale of consistent carb meals on BG levels  Handouts given during visit include:  Living Well with Diabetes  Carb Counting and Reading Food Labels handouts  Meal Planner Card  Monitoring/Evaluation:  Dietary intake, exercise, reading food labels, and body weight in 2 week(s).

## 2011-04-15 NOTE — Progress Notes (Signed)
HPI  [name] is a 76 y.o. male is seen in followup for abortive cardiac arrest in the setting of modest ischemic cardiomyopathy with prior bypass surgery and an ejection fraction of 40-45%. He is status post ICD implantation He has had no recurrent events. He has remote syncope.    The patient denies SOB, chest pain, or palpitations; he does have some peripheral edema which is recurrent.  Laboratory results reviewed from February LDL 117    Past Medical History  Diagnosis Date  . CAD (coronary artery disease)     CABG 1996  . Cardiac arrest 2008    AICD in right ventricle --placed afterward  . Cardiomyopathy, ischemic   . Type II or unspecified type diabetes mellitus without mention of complication, not stated as uncontrolled 03/2011  . Disorder of bone and cartilage, unspecified   . Anemia, unspecified   . OSA on CPAP     sleep study 2008, severe OSA  . Hypercholesterolemia   . GERD (gastroesophageal reflux disease)   . Hx of colonic polyps 2003    Due for REPEAT  . CRI (chronic renal insufficiency)     CrCl @50ml /min    Past Surgical History  Procedure Date  . Coronary artery bypass graft 1996    3 vessel  . Pacemaker placement 07/2006    [employer] Jude current VRRF  . Colonoscopy w/ polypectomy 2003    No path record available    Current Outpatient Prescriptions  Medication Sig Dispense Refill  . aspirin 325 MG EC tablet Take 325 mg by mouth daily.      [name] b complex vitamins tablet Take 1 tablet by mouth daily.      . Calcium Carbonate (CALCIUM 500 PO) Take 1 tablet by mouth daily.      . Coenzyme Q10 100 MG CHEW Chew by mouth daily.      [name] DIGESTIVE ENZYMES PO Take 1 tablet by mouth daily.      [name] glucose blood (FREESTYLE LITE) test strip Use as instructed to check blood sugar.  DX 250.0  100 each  3  . KLOR-CON M20 20 MEQ tablet Take 1 tablet by mouth Daily.      . Lancets (FREESTYLE) lancets Use as directed to check blood sugar.  DX:250.0  100 each  5  .  lisinopril (PRINIVIL,ZESTRIL) 5 MG tablet Take 1 tablet (5 mg total) by mouth daily.  90 tablet  1  . metoprolol succinate (TOPROL-XL) 50 MG 24 hr tablet TAKE 1 TABLET EVERY DAY  90 tablet  3  . Multiple Vitamin (MULTIVITAMIN) tablet Take 1 tablet by mouth daily.      . NON FORMULARY OPC 3.  Take 1 tablet by mouth daily.      . NON FORMULARY GLUCOSTATIN. Once daily.      . Omega-3 Fatty Acids (FISH OIL) 1000 MG CPDR Take 1 capsule by mouth daily.      . pantoprazole (PROTONIX) 40 MG tablet TAKE 1 TABLET EVERY DAY  90 tablet  3  . simvastatin (ZOCOR) 40 MG tablet Take 1 tablet (40 mg total) by mouth every evening.  90 tablet  1  . VIAGRA 100 MG tablet Take 1 tablet by mouth as needed.      [name] VITAMIN D-VITAMIN K PO Take 1 tablet by mouth daily.        Allergies  Allergen Reactions  . Aleve Nausea Only    Review of Systems negative except from HPI and PMH  Physical  Exam BP 118/69  Pulse 57  Ht 6' (1.829 m)  Wt 237 lb 12.8 oz (107.865 kg)  BMI 32.25 kg/m2 Well developed and well nourished in no acute distress HENT normal E scleral and icterus clear Neck Supple JVP flat; carotids brisk and full Clear to ausculation Regular rate and rhythm+s4 Soft with active bowel sounds No clubbing cyanosis Trace Edema Alert and oriented, grossly normal motor and sensory function Skin Warm and Dry   Assessment and  Plan

## 2011-04-15 NOTE — Assessment & Plan Note (Signed)
Patient is not at goal with an LDL of 117. He will review with his pharmacist cough considerations as relates to rosuvastatin or atorvastatin. He will let us know

## 2011-04-15 NOTE — Patient Instructions (Signed)
Plan

## 2011-04-15 NOTE — Assessment & Plan Note (Signed)
No intercurrent Ventricular tachycardia  

## 2011-04-15 NOTE — Patient Instructions (Signed)
Your physician wants you to follow-up in: 1 year.  You will receive a reminder letter in the mail two months in advance. If you don't receive a letter, please call our office to schedule the follow-up appointment.  Remote monitoring is used to monitor your Pacemaker of ICD from home. This monitoring reduces the number of office visits required to check your device to one time per year. It allows us to keep an eye on the functioning of your device to ensure it is working properly. You are scheduled for a device check from home on 07/17/11. You may send your transmission at any time that day. If you have a wireless device, the transmission will be sent automatically. After your physician reviews your transmission, you will receive a postcard with your next transmission date.   

## 2011-04-15 NOTE — Assessment & Plan Note (Signed)
Stable on current medications 

## 2011-04-15 NOTE — Assessment & Plan Note (Signed)
The patient's device was interrogated.  The information was reviewed. No changes were made in the programming.    

## 2011-04-24 ENCOUNTER — Encounter: Payer: Medicare Other | Attending: Family Medicine | Admitting: *Deleted

## 2011-04-24 ENCOUNTER — Encounter: Payer: Self-pay | Admitting: *Deleted

## 2011-04-24 VITALS — Ht 72.0 in | Wt 238.2 lb

## 2011-04-24 DIAGNOSIS — Z713 Dietary counseling and surveillance: Secondary | ICD-10-CM | POA: Insufficient documentation

## 2011-04-24 DIAGNOSIS — R7301 Impaired fasting glucose: Secondary | ICD-10-CM | POA: Diagnosis not present

## 2011-04-24 DIAGNOSIS — E785 Hyperlipidemia, unspecified: Secondary | ICD-10-CM | POA: Diagnosis not present

## 2011-04-24 NOTE — Progress Notes (Signed)
  Medical Nutrition Therapy:  Appt start time: 1045 end time:  1115.  Assessment:  Primary concerns today: patient here for follow up visit for obesity and pre-diabetes. He is doing well with Carb Counting although there is a slight increase in his weight from last visit. 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  MEDICATIONS: see list   DIETARY INTAKE:  Usual eating pattern includes 3 meals and 1 snack per day.  Everyday foods include good variety of all food groups.  Avoided foods include regular and sweetened beverages.    24-hr recall:  B ( AM): scrambled eggs with peppers and cheese, OR 2-3 medium pancakes OR muffin or coffee cake Or toast with muenster cheese AND fresh fruit, coffee with sweetener Snk ( AM): none  L ( PM): sandwich or meat roll ups, occasionally chips, Fresca  Snk ( PM): none D ( PM): meat - mostly fish or chicken, starches, vegetables (1 plate), salad Snk ( PM):less ice cream, or Klondike bar OR sour dough pretzels Beverages: water, coffee, Fresca, wine, occasionally beer,   Usual physical activity: active with work, walk dog 3 times a day 1/4 mile each  Estimated energy needs: 1600 calories 180 g carbohydrates 120 g protein 44 g fat  Progress Towards Goal(s):  In progress.   Nutritional Diagnosis:  NI-1.5 Excessive energy intake As related to obesity.  As evidenced by BMI of 32.3%.    Intervention:  Commended him on his progress with carb counting and activity level. Answered questions on food choices and diabetes.  Monitoring/Evaluation:  Dietary intake, exercise, reading food labels, and body weight as needed.

## 2011-05-06 DIAGNOSIS — H5200 Hypermetropia, unspecified eye: Secondary | ICD-10-CM | POA: Diagnosis not present

## 2011-05-06 DIAGNOSIS — H43399 Other vitreous opacities, unspecified eye: Secondary | ICD-10-CM | POA: Diagnosis not present

## 2011-05-06 DIAGNOSIS — H2510 Age-related nuclear cataract, unspecified eye: Secondary | ICD-10-CM | POA: Diagnosis not present

## 2011-05-06 DIAGNOSIS — H18599 Other hereditary corneal dystrophies, unspecified eye: Secondary | ICD-10-CM | POA: Diagnosis not present

## 2011-05-20 ENCOUNTER — Ambulatory Visit (AMBULATORY_SURGERY_CENTER): Payer: Medicare Other

## 2011-05-20 VITALS — Ht 72.0 in | Wt 236.3 lb

## 2011-05-20 DIAGNOSIS — Z1211 Encounter for screening for malignant neoplasm of colon: Secondary | ICD-10-CM

## 2011-05-20 DIAGNOSIS — Z86010 Personal history of colonic polyps: Secondary | ICD-10-CM

## 2011-05-20 MED ORDER — PEG-KCL-NACL-NASULF-NA ASC-C 100 G PO SOLR: 1.0000 | Freq: Once | ORAL | Status: AC

## 2011-06-03 ENCOUNTER — Other Ambulatory Visit: Payer: Medicare Other | Admitting: Gastroenterology

## 2011-06-10 ENCOUNTER — Encounter: Payer: Self-pay | Admitting: Gastroenterology

## 2011-06-10 ENCOUNTER — Ambulatory Visit (AMBULATORY_SURGERY_CENTER): Payer: Medicare Other | Admitting: Gastroenterology

## 2011-06-10 VITALS — BP 117/70 | HR 60 | Temp 98.4°F | Resp 18 | Ht 72.0 in | Wt 236.0 lb

## 2011-06-10 DIAGNOSIS — Z860100 Personal history of colon polyps, unspecified: Secondary | ICD-10-CM

## 2011-06-10 DIAGNOSIS — D126 Benign neoplasm of colon, unspecified: Secondary | ICD-10-CM

## 2011-06-10 DIAGNOSIS — Z1211 Encounter for screening for malignant neoplasm of colon: Secondary | ICD-10-CM

## 2011-06-10 DIAGNOSIS — K5730 Diverticulosis of large intestine without perforation or abscess without bleeding: Secondary | ICD-10-CM

## 2011-06-10 HISTORY — PX: COLONOSCOPY: SHX174

## 2011-06-10 MED ORDER — SODIUM CHLORIDE 0.9 % IV SOLN: 500.0000 mL | INTRAVENOUS | Status: DC

## 2011-06-10 NOTE — Op Note (Signed)
[facility] [address] [place], [place]  [address]  COLONOSCOPY PROCEDURE REPORT  PATIENT:  [name], [name]  MR#:  [id number] BIRTHDATE:  1935-06-26, 75 yrs. old  GENDER:  male ENDOSCOPIST:  Rachael Fee, MD REF. BY:  Earley Favor, M.D. PROCEDURE DATE:  06/10/2011 PROCEDURE:  Colonoscopy with biopsy ASA CLASS:  Class II INDICATIONS:  adenomatous polyps removed by [facility] in 2003; was recommended to have repeat examination in 10/2004 MEDICATIONS:   Fentanyl 100 mcg IV, These medications were titrated to patient response per physician's verbal order, Versed 10 mg IV  DESCRIPTION OF PROCEDURE:   After the risks benefits and alternatives of the procedure were thoroughly explained, informed consent was obtained.  Digital rectal exam was performed and revealed no rectal masses.   The LB [id number] endoscope was introduced through the anus and advanced to the cecum, which was identified by both the appendix and ileocecal valve, without limitations.  The quality of the prep was good..  The instrument was then slowly withdrawn as the colon was fully examined. <<PROCEDUREIMAGES>> FINDINGS:  A diminutive polyp was found in the transverse colon. This was removed with forceps, sent to pathology (jar 1) (see image5).  Mild diverticulosis was found in the sigmoid to descending colon segments (see image7).  This was otherwise a normal examination of the colon (see image8, image3, and image4). Retroflexed views in the rectum revealed no abnormalities. COMPLICATIONS:  None  ENDOSCOPIC IMPRESSION: 1) Diminutive polyp in the transverse colon, removed and sent to pathology 2) Mild diverticulosis in the sigmoid to descending colon segments 3) Otherwise normal examination  RECOMMENDATIONS: 1) Given your personal history of adenomatous (pre-cancerous) polyps, you will need a repeat colonoscopy in 5 years even if the polyp removed today is not pre-cancerous 2) You will  receive a letter within 1-2 weeks with the results of your biopsy as well as final recommendations. Please call my office if you have not received a letter after 3 weeks.  ______________________________ Rachael Fee, MD  n. eSIGNED:   Rachael Fee at 06/10/2011 02:14 PM  Elenore Rota, [phone]

## 2011-06-10 NOTE — Progress Notes (Signed)
Patient did not have preoperative order for IV antibiotic SSI prophylaxis. (G8918) Patient did not experience any of the following events: a burn prior to discharge; a fall within the facility; wrong site/side/patient/procedure/implant event; or a hospital transfer or hospital admission upon discharge from the facility. (G8907) Patient did not have preoperative order for IV antibiotic SSI prophylaxis. (G8918)  

## 2011-06-10 NOTE — Patient Instructions (Signed)
YOU HAD AN ENDOSCOPIC PROCEDURE TODAY AT THE [facility]: Refer to the procedure report that was given to you for any specific questions about what was found during the examination.  If the procedure report does not answer your questions, please call your gastroenterologist to clarify.  If you requested that your care partner not be given the details of your procedure findings, then the procedure report has been included in a sealed envelope for you to review at your convenience later.  YOU SHOULD EXPECT: Some feelings of bloating in the abdomen. Passage of more gas than usual.  Walking can help get rid of the air that was put into your GI tract during the procedure and reduce the bloating. If you had a lower endoscopy (such as a colonoscopy or flexible sigmoidoscopy) you may notice spotting of blood in your stool or on the toilet paper. If you underwent a bowel prep for your procedure, then you may not have a normal bowel movement for a few days.  DIET: Your first meal following the procedure should be a light meal and then it is ok to progress to your normal diet.  A half-sandwich or bowl of soup is an example of a good first meal.  Heavy or fried foods are harder to digest and may make you feel nauseous or bloated.  Likewise meals heavy in dairy and vegetables can cause extra gas to form and this can also increase the bloating.  Drink plenty of fluids but you should avoid alcoholic beverages for 24 hours.  ACTIVITY: Your care partner should take you home directly after the procedure.  You should plan to take it easy, moving slowly for the rest of the day.  You can resume normal activity the day after the procedure however you should NOT DRIVE or use heavy machinery for 24 hours (because of the sedation medicines used during the test).    SYMPTOMS TO REPORT IMMEDIATELY: A gastroenterologist can be reached at any hour.  During normal business hours, 8:30 AM to 5:00 PM Monday through Friday,  call [phone].  After hours and on weekends, please call the GI answering service at [phone] who will take a message and have the physician on call contact you.   Following lower endoscopy (colonoscopy or flexible sigmoidoscopy):  Excessive amounts of blood in the stool  Significant tenderness or worsening of abdominal pains  Swelling of the abdomen that is new, acute  Fever of 100F or higher  Following upper endoscopy (EGD)  Vomiting of blood or coffee ground material  New chest pain or pain under the shoulder blades  Painful or persistently difficult swallowing  New shortness of breath  Fever of 100F or higher  Black, tarry-looking stools  FOLLOW UP: If any biopsies were taken you will be contacted by phone or by letter within the next 1-3 weeks.  Call your gastroenterologist if you have not heard about the biopsies in 3 weeks.  Our staff will call the home number listed on your records the next business day following your procedure to check on you and address any questions or concerns that you may have at that time regarding the information given to you following your procedure. This is a courtesy call and so if there is no answer at the home number and we have not heard from you through the emergency physician on call, we will assume that you have returned to your regular daily activities without incident.  SIGNATURES/CONFIDENTIALITY: You and/or your care   partner have signed paperwork which will be entered into your electronic medical record.  These signatures attest to the fact that that the information above on your After Visit Summary has been reviewed and is understood.  Full responsibility of the confidentiality of this discharge information lies with you and/or your care-partner.  

## 2011-06-11 ENCOUNTER — Telehealth: Payer: Self-pay

## 2011-06-11 NOTE — Telephone Encounter (Signed)
  Follow up Call-  Call back number 06/10/2011  Post procedure Call Back phone  # [phone]  Permission to leave phone message Yes     Patient questions:  Do you have a fever, pain , or abdominal swelling? no Pain Score  0 *  Have you tolerated food without any problems? yes  Have you been able to return to your normal activities? yes  Do you have any questions about your discharge instructions: Diet   no Medications  no Follow up visit  no  Do you have questions or concerns about your Care? no  Actions: * If pain score is 4 or above: No action needed, pain <4.

## 2011-06-16 ENCOUNTER — Other Ambulatory Visit: Payer: Self-pay | Admitting: *Deleted

## 2011-06-16 NOTE — Telephone Encounter (Signed)
Faxed refill request received from pharmacy for Viagra Last filled by MD on 02/2010, #8 x 11 by Dr. Scotty Court Follow up on 09/16/11 OK to fill?

## 2011-06-17 ENCOUNTER — Encounter: Payer: Self-pay | Admitting: Gastroenterology

## 2011-06-17 ENCOUNTER — Encounter: Payer: Self-pay | Admitting: Family Medicine

## 2011-06-17 MED ORDER — SILDENAFIL CITRATE 100 MG PO TABS: 100.0000 mg | ORAL_TABLET | ORAL | Status: DC | PRN

## 2011-06-24 ENCOUNTER — Other Ambulatory Visit: Payer: Self-pay | Admitting: Family Medicine

## 2011-06-26 ENCOUNTER — Ambulatory Visit (INDEPENDENT_AMBULATORY_CARE_PROVIDER_SITE_OTHER): Payer: Medicare Other | Admitting: Family Medicine

## 2011-06-26 ENCOUNTER — Encounter: Payer: Self-pay | Admitting: Family Medicine

## 2011-06-26 VITALS — BP 123/75 | HR 68 | Ht 70.5 in | Wt 236.0 lb

## 2011-06-26 DIAGNOSIS — N529 Male erectile dysfunction, unspecified: Secondary | ICD-10-CM

## 2011-06-26 DIAGNOSIS — F528 Other sexual dysfunction not due to a substance or known physiological condition: Secondary | ICD-10-CM

## 2011-06-26 DIAGNOSIS — R6882 Decreased libido: Secondary | ICD-10-CM | POA: Insufficient documentation

## 2011-06-26 DIAGNOSIS — E291 Testicular hypofunction: Secondary | ICD-10-CM | POA: Diagnosis not present

## 2011-06-26 DIAGNOSIS — E119 Type 2 diabetes mellitus without complications: Secondary | ICD-10-CM | POA: Diagnosis not present

## 2011-06-26 LAB — HEMOGLOBIN A1C: Hgb A1c MFr Bld: 6.5 % (ref 4.6–6.5)

## 2011-06-26 LAB — TESTOSTERONE: Testosterone: 207.89 ng/dL — ABNORMAL LOW (ref 350.00–890.00)

## 2011-06-26 MED ORDER — SILDENAFIL CITRATE 100 MG PO TABS: 50.0000 mg | ORAL_TABLET | Freq: Every day | ORAL | Status: AC | PRN

## 2011-06-26 NOTE — Assessment & Plan Note (Signed)
Dietary changes and exercise helping. Recheck HbA1c today.

## 2011-06-26 NOTE — Assessment & Plan Note (Signed)
Testosterone level checked today.

## 2011-06-26 NOTE — Assessment & Plan Note (Signed)
Continue viagra, encouraged him to visit [email or web address] to see if any patient assistance programs are available. Unfortunately, he's not eligible for any of the coupons for viagra b/c he has medicare.

## 2011-06-26 NOTE — Progress Notes (Signed)
OFFICE NOTE  06/26/2011  CC:  Chief Complaint  Patient presents with  . Follow-up    discuss ED meds, insurance will not pay for any     HPI: Patient is a 76 y.o. Caucasian male who is here for discussion of medication cost issues.  Of note, dx'd early/mild DM 2 about 3 mo ago and he saw nutritionist and started using a glucometer to monitor glucoses. New rx plan won't cover any ED meds.  He typically uses 6 pills in 4-6 mo or so. He is interested in testosterone testing: says libido is down moderately last 6-12 months, has long hx of ED.    Fasting sugars 106-114 since his dietary changes.  He has increased his exercise some as well.  No postprandial or random glucoses checked in last couple of months but in 1st month these were no diff than fasting per his report.     Pertinent PMH:  Past Medical History  Diagnosis Date  . CAD (coronary artery disease)     CABG 1996  . Cardiac arrest 2008    AICD in right ventricle --placed afterward  . Cardiomyopathy, ischemic   . Type II or unspecified type diabetes mellitus without mention of complication, not stated as uncontrolled 03/2011  . Disorder of bone and cartilage, unspecified   . Anemia, unspecified   . OSA on CPAP     sleep study 2008, severe OSA  . Hypercholesterolemia   . GERD (gastroesophageal reflux disease)   . Hx of adenomatous colonic polyps 2003, 2013  . CRI (chronic renal insufficiency)     CrCl @50ml /min   Past surgical, social, and family history reviewed and no changes noted since last office visit.  MEDS:  Outpatient Prescriptions Prior to Visit  Medication Sig Dispense Refill  . aspirin 325 MG EC tablet Take 325 mg by mouth daily.      [name] b complex vitamins tablet Take 1 tablet by mouth daily.      . Calcium Carbonate (CALCIUM 500 PO) Take 1 tablet by mouth daily.      [name] DIGESTIVE ENZYMES PO Take 1 tablet by mouth as needed.       [name] glucose blood (FREESTYLE LITE) test strip Use as instructed to check blood  sugar.  DX 250.0  100 each  3  . KLOR-CON M20 20 MEQ tablet Take 1 tablet by mouth as needed.       . Lancets (FREESTYLE) lancets Use as directed to check blood sugar.  DX:250.0  100 each  5  . lisinopril (PRINIVIL,ZESTRIL) 5 MG tablet Take 1 tablet (5 mg total) by mouth daily.  90 tablet  1  . metoprolol succinate (TOPROL-XL) 50 MG 24 hr tablet TAKE 1 TABLET EVERY DAY  90 tablet  3  . Multiple Vitamin (MULTIVITAMIN) tablet Take 1 tablet by mouth daily.      . NON FORMULARY OPC 3.  Take 1 tablet by mouth daily.      . NON FORMULARY GLUCOSTATIN. Once daily.      . Omega-3 Fatty Acids (FISH OIL) 1000 MG CPDR Take 1 capsule by mouth daily.      . pantoprazole (PROTONIX) 40 MG tablet TAKE 1 TABLET EVERY DAY  90 tablet  3  . sildenafil (VIAGRA) 100 MG tablet Take 1 tablet (100 mg total) by mouth as needed.  8 tablet  0  . simvastatin (ZOCOR) 40 MG tablet Take 1 tablet (40 mg total) by mouth every evening.  90 tablet  1  . Coenzyme Q10 100 MG CHEW Chew by mouth daily.      [name] VITAMIN D-VITAMIN K PO Take 1 tablet by mouth daily.        PE: Blood pressure 123/75, pulse 68, height 5' 10.5" (1.791 m), weight 236 lb (107.049 kg). Gen: Alert, well appearing.  Patient is oriented to person, place, time, and situation. No further exam today.  IMPRESSION AND PLAN:  Decreased libido Testosterone level checked today.  ERECTILE DYSFUNCTION, NON-ORGANIC, MILD Continue viagra, encouraged him to visit [email or web address] to see if any patient assistance programs are available. Unfortunately, he's not eligible for any of the coupons for viagra b/c he has medicare.  Type II or unspecified type diabetes mellitus without mention of complication, not stated as uncontrolled Dietary changes and exercise helping. Recheck HbA1c today.      FOLLOW UP: prn

## 2011-06-27 LAB — LUTEINIZING HORMONE: LH: 2.25 m[IU]/mL — ABNORMAL LOW (ref 3.10–34.60)

## 2011-06-27 LAB — PROLACTIN: Prolactin: 3.4 ng/mL (ref 2.1–17.1)

## 2011-06-27 NOTE — Progress Notes (Signed)
Addended by: Baldemar Lenis R on: 06/27/2011 08:27 AM   Modules accepted: Orders

## 2011-06-30 ENCOUNTER — Other Ambulatory Visit: Payer: Self-pay | Admitting: Family Medicine

## 2011-06-30 DIAGNOSIS — E291 Testicular hypofunction: Secondary | ICD-10-CM

## 2011-06-30 MED ORDER — TESTOSTERONE 10 MG/ACT (2%) TD GEL: 40.0000 mg | Freq: Every day | TRANSDERMAL | Status: DC

## 2011-07-01 ENCOUNTER — Encounter: Payer: Self-pay | Admitting: *Deleted

## 2011-07-03 DIAGNOSIS — M65839 Other synovitis and tenosynovitis, unspecified forearm: Secondary | ICD-10-CM | POA: Diagnosis not present

## 2011-07-03 DIAGNOSIS — M65849 Other synovitis and tenosynovitis, unspecified hand: Secondary | ICD-10-CM | POA: Diagnosis not present

## 2011-07-03 DIAGNOSIS — M19049 Primary osteoarthritis, unspecified hand: Secondary | ICD-10-CM | POA: Diagnosis not present

## 2011-07-07 ENCOUNTER — Telehealth: Payer: Self-pay | Admitting: *Deleted

## 2011-07-07 ENCOUNTER — Other Ambulatory Visit: Payer: Self-pay | Admitting: Family Medicine

## 2011-07-07 MED ORDER — TESTOSTERONE 20.25 MG/ACT (1.62%) TD GEL: 40.5000 mg | Freq: Every day | TRANSDERMAL | Status: DC

## 2011-07-07 NOTE — Telephone Encounter (Signed)
Letter received from [facility] stating pt was given 30 day supply of Fortesta for continuation of therapy, but after that time it will not be covered.  Formulary alternative is Androgel that will require prior auth also.  Please advise alternative therapy.

## 2011-07-07 NOTE — Telephone Encounter (Signed)
OK, I'm going to try to rx androgel and see what the prior auth says.  I want to make sure it doesn't say we can get by with androderm before I tell him that the injection is his only alternative at this point.  -thx

## 2011-07-08 NOTE — Telephone Encounter (Signed)
RX faxed

## 2011-07-17 ENCOUNTER — Ambulatory Visit (INDEPENDENT_AMBULATORY_CARE_PROVIDER_SITE_OTHER): Payer: Medicare Other | Admitting: *Deleted

## 2011-07-17 ENCOUNTER — Encounter: Payer: Self-pay | Admitting: Internal Medicine

## 2011-07-17 DIAGNOSIS — Z95810 Presence of automatic (implantable) cardiac defibrillator: Secondary | ICD-10-CM | POA: Diagnosis not present

## 2011-07-17 DIAGNOSIS — I469 Cardiac arrest, cause unspecified: Secondary | ICD-10-CM

## 2011-07-18 LAB — REMOTE ICD DEVICE
BATTERY VOLTAGE: 2.77 V
BRDY-0002RV: 40 {beats}/min
DEV-0020ICD: NEGATIVE
DEVICE MODEL ICD: 449388
HV IMPEDENCE: 51 Ohm
RV LEAD AMPLITUDE: 9.8 mv
RV LEAD IMPEDENCE ICD: 590 Ohm
TZAT-0001FASTVT: 1
TZAT-0001SLOWVT: 1
TZAT-0004FASTVT: 8
TZAT-0004SLOWVT: 8
TZAT-0012FASTVT: 200 ms
TZAT-0012SLOWVT: 200 ms
TZAT-0013FASTVT: 1
TZAT-0013SLOWVT: 4
TZAT-0018FASTVT: NEGATIVE
TZAT-0018SLOWVT: NEGATIVE
TZAT-0019FASTVT: 7.5 V
TZAT-0019SLOWVT: 7.5 V
TZAT-0020FASTVT: 1 ms
TZAT-0020SLOWVT: 1 ms
TZON-0003FASTVT: 300 ms
TZON-0003SLOWVT: 340 ms
TZON-0004FASTVT: 12
TZON-0004SLOWVT: 24
TZON-0005FASTVT: 6
TZON-0005SLOWVT: 6
TZON-0010FASTVT: 80 ms
TZON-0010SLOWVT: 80 ms
TZST-0001FASTVT: 2
TZST-0001FASTVT: 3
TZST-0001FASTVT: 4
TZST-0001FASTVT: 5
TZST-0001SLOWVT: 2
TZST-0001SLOWVT: 3
TZST-0001SLOWVT: 4
TZST-0001SLOWVT: 5
TZST-0003FASTVT: 36 J
TZST-0003FASTVT: 36 J
TZST-0003FASTVT: 36 J
TZST-0003FASTVT: 36 J
TZST-0003SLOWVT: 25 J
TZST-0003SLOWVT: 36 J
TZST-0003SLOWVT: 36 J
TZST-0003SLOWVT: 36 J
VENTRICULAR PACING ICD: 1 pct

## 2011-07-21 ENCOUNTER — Encounter: Payer: Self-pay | Admitting: Family Medicine

## 2011-07-21 ENCOUNTER — Ambulatory Visit (INDEPENDENT_AMBULATORY_CARE_PROVIDER_SITE_OTHER): Payer: Medicare Other | Admitting: Family Medicine

## 2011-07-21 VITALS — BP 115/71 | HR 62 | Temp 98.0°F | Ht 70.5 in | Wt 239.0 lb

## 2011-07-21 DIAGNOSIS — J019 Acute sinusitis, unspecified: Secondary | ICD-10-CM | POA: Diagnosis not present

## 2011-07-21 DIAGNOSIS — J209 Acute bronchitis, unspecified: Secondary | ICD-10-CM | POA: Insufficient documentation

## 2011-07-21 MED ORDER — FLUTICASONE PROPIONATE 50 MCG/ACT NA SUSP: NASAL | Status: DC

## 2011-07-21 MED ORDER — METHYLPREDNISOLONE ACETATE 40 MG/ML IJ SUSP
40.0000 mg | Freq: Once | INTRAMUSCULAR | Status: AC
  Administered 2011-07-21: 40 mg via INTRAMUSCULAR

## 2011-07-21 MED ORDER — AZITHROMYCIN 250 MG PO TABS: ORAL_TABLET | ORAL | Status: DC

## 2011-07-21 MED ORDER — AZITHROMYCIN 250 MG PO TABS: 250.0000 mg | ORAL_TABLET | Freq: Every day | ORAL | Status: DC

## 2011-07-21 MED ORDER — HYDROCODONE-HOMATROPINE 5-1.5 MG/5ML PO SYRP: ORAL_SOLUTION | ORAL | Status: AC

## 2011-07-21 NOTE — Patient Instructions (Signed)
Buy generic allegra 180mg  tabs over the counter and use as needed for allergy symptoms.

## 2011-07-21 NOTE — Progress Notes (Signed)
OFFICE NOTE  07/21/2011  CC:  Chief Complaint  Patient presents with  . URI    since last Monday, pt was trimming bushes and has had cough, ST since then     HPI: Patient is a 76 y.o. Caucasian male who is here for respiratory symptoms. Pt presents complaining of respiratory symptoms for 7 days.  Primary symptoms are: stuffy nose with mucous/PND (thick/green), cough but no chest tightness or wheezing or SOB.  Worst symptoms seems to be the PND and coughing, fatigued.  Lately the symptoms seem to be worsening. Pertinent negatives: No fevers, no wheezing, and no SOB.  No pain in face or teeth.  No significant HA.  ST mild at most.   Symptoms made worse by activity.  Symptoms improved by nothing. Smoker? no  Additional ROS: no n/v/d or abdominal pain.  No rash.  No neck stiffness.   +Fatigue.  +Mild appetite loss.  Pertinent PMH:  Past Medical History  Diagnosis Date  . CAD (coronary artery disease)     CABG 1996  . Cardiac arrest 2008    AICD in right ventricle --placed afterward  . Cardiomyopathy, ischemic   . Type II or unspecified type diabetes mellitus without mention of complication, not stated as uncontrolled 03/2011  . Disorder of bone and cartilage, unspecified   . Anemia, unspecified   . OSA on CPAP     sleep study 2008, severe OSA  . Hypercholesterolemia   . GERD (gastroesophageal reflux disease)   . Hx of adenomatous colonic polyps 2003, 2013  . CRI (chronic renal insufficiency)     CrCl @50ml /min   Past surgical, social, and family history reviewed and no changes noted since last office visit.  MEDS:  Outpatient Prescriptions Prior to Visit  Medication Sig Dispense Refill  . aspirin 325 MG EC tablet Take 325 mg by mouth daily.      [name] b complex vitamins tablet Take 1 tablet by mouth daily.      . Calcium Carbonate (CALCIUM 500 PO) Take 1 tablet by mouth daily.      . Coenzyme Q10 100 MG CHEW Chew by mouth daily.      [name] DIGESTIVE ENZYMES PO Take 1 tablet by  mouth as needed.       [name] glucose blood (FREESTYLE LITE) test strip Use as instructed to check blood sugar.  DX 250.0  100 each  3  . KLOR-CON M20 20 MEQ tablet Take 1 tablet by mouth as needed.       . Lancets (FREESTYLE) lancets Use as directed to check blood sugar.  DX:250.0  100 each  5  . lisinopril (PRINIVIL,ZESTRIL) 5 MG tablet Take 1 tablet (5 mg total) by mouth daily.  90 tablet  1  . metoprolol succinate (TOPROL-XL) 50 MG 24 hr tablet TAKE 1 TABLET EVERY DAY  90 tablet  3  . Multiple Vitamin (MULTIVITAMIN) tablet Take 1 tablet by mouth daily.      . NON FORMULARY OPC 3.  Take 1 tablet by mouth daily.      . NON FORMULARY GLUCOSTATIN. Once daily.      . Omega-3 Fatty Acids (FISH OIL) 1000 MG CPDR Take 1 capsule by mouth daily.      . pantoprazole (PROTONIX) 40 MG tablet TAKE 1 TABLET EVERY DAY  90 tablet  3  . sildenafil (VIAGRA) 100 MG tablet Take 0.5-1 tablets (50-100 mg total) by mouth daily as needed for erectile dysfunction.  8 tablet  6  .  simvastatin (ZOCOR) 40 MG tablet Take 1 tablet (40 mg total) by mouth every evening.  90 tablet  1  . Testosterone (ANDROGEL PUMP) 20.25 MG/ACT (1.62%) GEL Place 40.5 mg onto the skin daily. (1 pump rubbed into each shoulder and upper arm--2 pumps total per day)  75 g  5  . VITAMIN D-VITAMIN K PO Take 1 tablet by mouth daily.       No facility-administered medications prior to visit.    PE: Blood pressure 115/71, pulse 62, temperature 98 F (36.7 C), temperature source Temporal, height 5' 10.5" (1.791 m), weight 239 lb (108.41 kg), SpO2 94.00%. Gen: Alert, tired appearing but NAD.  Patient is oriented to person, place, time, and situation. HEENT: eyes without injection, drainage, or swelling.  Ears: EACs clear, TMs with normal light reflex and landmarks.  Nose: No active/visible rhinorrhea.  He has some dried, crusty exudate adherent to mildly injected mucosa.  No purulent d/c.  No paranasal sinus TTP.  No facial swelling.  Throat and mouth  without focal lesion.  No pharyngial swelling, erythema, or exudate.   Neck: supple, no LAD.   LUNGS: CTA bilat, nonlabored resps.   CV: RRR, no m/r/g. EXT: no c/c/e SKIN: no rash  LABS: none  IMPRESSION AND PLAN:  Sinusitis acute Allergic + ? infectious. With a component of acute bronchitis but not RAD component.  Depo-medrol 40mg  IM x 1 in office today. Allegra 180 qd suggested, flonase qd rx'd.  No bronchodilator based on the history he gives today and his exam today. Zithromax x 5d. Hycodan 1-2 tsp qhs night-time cough.  Delsym for daytime cough.  Avoid OTC multi-symptom formulas. Therapeutic expectations and side effect profile of medication discussed today.  Patient's questions answered. Return or call if not improving in 4-5 d.      FOLLOW UP: prn

## 2011-07-21 NOTE — Assessment & Plan Note (Addendum)
Allergic + ? infectious. With a component of acute bronchitis but not RAD component.  Depo-medrol 40mg  IM x 1 in office today. Allegra 180 qd suggested, flonase qd rx'd.  No bronchodilator based on the history he gives today and his exam today. Zithromax x 5d. Hycodan 1-2 tsp qhs night-time cough.  Delsym for daytime cough.  Avoid OTC multi-symptom formulas. Therapeutic expectations and side effect profile of medication discussed today.  Patient's questions answered. Return or call if not improving in 4-5 d.

## 2011-07-24 DIAGNOSIS — M65849 Other synovitis and tenosynovitis, unspecified hand: Secondary | ICD-10-CM | POA: Diagnosis not present

## 2011-07-24 DIAGNOSIS — M65839 Other synovitis and tenosynovitis, unspecified forearm: Secondary | ICD-10-CM | POA: Diagnosis not present

## 2011-08-01 ENCOUNTER — Encounter: Payer: Self-pay | Admitting: *Deleted

## 2011-09-16 ENCOUNTER — Ambulatory Visit (INDEPENDENT_AMBULATORY_CARE_PROVIDER_SITE_OTHER): Payer: Medicare Other | Admitting: Family Medicine

## 2011-09-16 ENCOUNTER — Encounter: Payer: Self-pay | Admitting: Family Medicine

## 2011-09-16 VITALS — BP 121/75 | HR 61 | Ht 70.5 in | Wt 240.0 lb

## 2011-09-16 DIAGNOSIS — I2510 Atherosclerotic heart disease of native coronary artery without angina pectoris: Secondary | ICD-10-CM | POA: Diagnosis not present

## 2011-09-16 DIAGNOSIS — E291 Testicular hypofunction: Secondary | ICD-10-CM

## 2011-09-16 DIAGNOSIS — E785 Hyperlipidemia, unspecified: Secondary | ICD-10-CM | POA: Diagnosis not present

## 2011-09-16 DIAGNOSIS — E119 Type 2 diabetes mellitus without complications: Secondary | ICD-10-CM

## 2011-09-16 DIAGNOSIS — N189 Chronic kidney disease, unspecified: Secondary | ICD-10-CM

## 2011-09-16 NOTE — Progress Notes (Signed)
OFFICE NOTE  09/17/2011  CC:  Chief Complaint  Patient presents with  . Follow-up    DM, Lipids, decrease lipido-testosterone     HPI: Patient is a 76 y.o. Caucasian male who is here for 3 mo f/u DM 2, hyperlipidemia, and hypogonadism. He was on Fortesta for 1 mo and didn't feel much different, has not been on it for about 3 wks now. It seems he may have misunderstood that this med was meant to be permanent replacement. His main hypogonadism sx's are poor libido and ED.    Says glucose checks were consistently 110-115 fasting, says he stopped checking it about 1 mo ago.   He walks 1/2 mile bid most days of the week, remains active in yard and home.   He is getting better at eating a low fat, low chol diet.  Pertinent PMH:  Past Medical History  Diagnosis Date  . CAD (coronary artery disease)     CABG 1996  . Cardiac arrest 2008    AICD in right ventricle --placed afterward  . Cardiomyopathy, ischemic   . Type II or unspecified type diabetes mellitus without mention of complication, not stated as uncontrolled 03/2011  . Disorder of bone and cartilage, unspecified   . Anemia, unspecified   . OSA on CPAP     sleep study 2008, severe OSA  . Hypercholesterolemia   . GERD (gastroesophageal reflux disease)   . Hx of adenomatous colonic polyps 2003, 2013  . CRI (chronic renal insufficiency)     CrCl @50ml /min   Past surgical, social, and family history reviewed and no changes noted since last office visit.  MEDS:  Outpatient Prescriptions Prior to Visit  Medication Sig Dispense Refill  . aspirin 325 MG EC tablet Take 325 mg by mouth daily.      [name] b complex vitamins tablet Take 1 tablet by mouth daily.      . Calcium Carbonate (CALCIUM 500 PO) Take 1 tablet by mouth daily.      . Coenzyme Q10 100 MG CHEW Chew by mouth daily.      [name] DIGESTIVE ENZYMES PO Take 1 tablet by mouth as needed.       [name] glucose blood (FREESTYLE LITE) test strip Use as instructed to check blood sugar.   DX 250.0  100 each  3  . KLOR-CON M20 20 MEQ tablet Take 1 tablet by mouth as needed.       . Lancets (FREESTYLE) lancets Use as directed to check blood sugar.  DX:250.0  100 each  5  . lisinopril (PRINIVIL,ZESTRIL) 5 MG tablet Take 1 tablet (5 mg total) by mouth daily.  90 tablet  1  . metoprolol succinate (TOPROL-XL) 50 MG 24 hr tablet TAKE 1 TABLET EVERY DAY  90 tablet  3  . Multiple Vitamin (MULTIVITAMIN) tablet Take 1 tablet by mouth daily.      . NON FORMULARY OPC 3.  Take 1 tablet by mouth daily.      . NON FORMULARY GLUCOSTATIN. Once daily.      . Omega-3 Fatty Acids (FISH OIL) 1000 MG CPDR Take 1 capsule by mouth daily.      . pantoprazole (PROTONIX) 40 MG tablet TAKE 1 TABLET EVERY DAY  90 tablet  3  . simvastatin (ZOCOR) 40 MG tablet Take 1 tablet (40 mg total) by mouth every evening.  90 tablet  1  . Testosterone (ANDROGEL PUMP) 20.25 MG/ACT (1.62%) GEL Place 40.5 mg onto the skin daily. (1 pump rubbed  into each shoulder and upper arm--2 pumps total per day)  75 g  5  . VITAMIN D-VITAMIN K PO Take 1 tablet by mouth daily.      [name] azithromycin (ZITHROMAX) 250 MG tablet 2 tabs po qd x 1d, then 1 tab po qd x 4d  6 each  0  . fluticasone (FLONASE) 50 MCG/ACT nasal spray 2 sprays each nostril once daily in the morning  16 g  6    PE: Blood pressure 121/75, pulse 61, height 5' 10.5" (1.791 m), weight 240 lb (108.863 kg). Gen: Alert, well appearing.  Patient is oriented to person, place, time, and situation. CV: RRR, no m/r/g.   LUNGS: CTA bilat, nonlabored resps, good aeration in all lung fields.  IMPRESSION AND PLAN:  Hypogonadism, male Resume Fortesta 2 pumps on each thigh qd. Recheck testosterone level after he's been back on this dose for 3-4 wks. Timing of blood draw for this was discussed--approx 2 hrs after his morning application of Fortesta. Will adjust dose as appropriate, shooting for a goal testosterone range of 700-800.  HYPERLIPIDEMIA Lab Results  Component Value  Date   CHOL 171 03/24/2011   HDL 37.80* 03/24/2011   LDLCALC 117* 03/24/2011   TRIG 80.0 03/24/2011   CHOLHDL 5 03/24/2011   With recent dx of DM 2 + his hx of CAD, his LDL goal is now 70 or less. We'll recheck fasting lipids when he comes in for recheck testosterone level in 3-4 wks. Continue simvastatin 40mg  qd for now.  Type II or unspecified type diabetes mellitus without mention of complication, not stated as uncontrolled Good control on diet only at this time. 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  He'll likely start silver sneakers program soon.  Will be due for next HbA1c when he comes in for repeat testost level in 3-4 wks.  FOLLOW UP: 6 months in office

## 2011-09-17 NOTE — Assessment & Plan Note (Signed)
Good control on diet only at this time. 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  He'll likely start silver sneakers program soon.

## 2011-09-17 NOTE — Assessment & Plan Note (Addendum)
Resume Fortesta 2 pumps on each thigh qd. Recheck testosterone level after he's been back on this dose for 3-4 wks. Timing of blood draw for this was discussed--approx 2 hrs after his morning application of Fortesta. Will adjust dose as appropriate, shooting for a goal testosterone range of 700-800.

## 2011-09-17 NOTE — Assessment & Plan Note (Signed)
Lab Results  Component Value Date   CHOL 171 03/24/2011   HDL 37.80* 03/24/2011   LDLCALC 117* 03/24/2011   TRIG 80.0 03/24/2011   CHOLHDL 5 03/24/2011   With recent dx of DM 2 + his hx of CAD, his LDL goal is now 70 or less. We'll recheck fasting lipids when he comes in for recheck testosterone level in 3-4 wks. Continue simvastatin 40mg  qd for now.

## 2011-09-19 DIAGNOSIS — M65839 Other synovitis and tenosynovitis, unspecified forearm: Secondary | ICD-10-CM | POA: Diagnosis not present

## 2011-09-25 ENCOUNTER — Telehealth: Payer: Self-pay | Admitting: Family Medicine

## 2011-09-25 NOTE — Telephone Encounter (Signed)
I spoke to pt and advised him we are waiting on prior auth answer.  He is agreeable.

## 2011-09-25 NOTE — Telephone Encounter (Signed)
Patient is requesting his Rx for Foresta to be sent to [facility], they will not fill the prescription. If you cannot reach the patient at his home # please call his cell.

## 2011-09-26 NOTE — Telephone Encounter (Signed)
Due to formulary/insurance restrictions, we've rx'd him both the fortesta and the androgel in the past.  It appears he is trying to get RF on the fortesta, when he should actually ask for RF of the androgel.  Please contact his pharmacy and have the fortesta cancelled.--thx

## 2011-09-26 NOTE — Telephone Encounter (Signed)
Notified pt that insurance will not pay for Fortesta, but they will pay for Androgel with prior auth approval.  Prior Auth completed and authorized for 2 years for all strengths/formulations.   Pt is notified and agreeable.  Please RX Androgel for patient.  Thanks,

## 2011-09-26 NOTE — Telephone Encounter (Signed)
Called pharmacy.  They will fill Rx for androgel.  copay is $35.

## 2011-10-07 ENCOUNTER — Other Ambulatory Visit (INDEPENDENT_AMBULATORY_CARE_PROVIDER_SITE_OTHER): Payer: Medicare Other

## 2011-10-07 DIAGNOSIS — N189 Chronic kidney disease, unspecified: Secondary | ICD-10-CM

## 2011-10-07 DIAGNOSIS — E291 Testicular hypofunction: Secondary | ICD-10-CM

## 2011-10-07 DIAGNOSIS — E119 Type 2 diabetes mellitus without complications: Secondary | ICD-10-CM | POA: Diagnosis not present

## 2011-10-07 DIAGNOSIS — E785 Hyperlipidemia, unspecified: Secondary | ICD-10-CM

## 2011-10-07 DIAGNOSIS — I2510 Atherosclerotic heart disease of native coronary artery without angina pectoris: Secondary | ICD-10-CM | POA: Diagnosis not present

## 2011-10-07 LAB — COMPREHENSIVE METABOLIC PANEL
ALT: 34 U/L (ref 0–53)
AST: 31 U/L (ref 0–37)
Albumin: 4.1 g/dL (ref 3.5–5.2)
Alkaline Phosphatase: 34 U/L — ABNORMAL LOW (ref 39–117)
BUN: 21 mg/dL (ref 6–23)
CO2: 25 mEq/L (ref 19–32)
Calcium: 9.4 mg/dL (ref 8.4–10.5)
Chloride: 107 mEq/L (ref 96–112)
Creatinine, Ser: 1.6 mg/dL — ABNORMAL HIGH (ref 0.4–1.5)
GFR: 45.83 mL/min — ABNORMAL LOW (ref >60.00)
Glucose, Bld: 113 mg/dL — ABNORMAL HIGH (ref 70–99)
Potassium: 4.1 mEq/L (ref 3.5–5.1)
Sodium: 140 mEq/L (ref 135–145)
Total Bilirubin: 0.9 mg/dL (ref 0.3–1.2)
Total Protein: 7.3 g/dL (ref 6.0–8.3)

## 2011-10-07 LAB — LIPID PANEL
Cholesterol: 176 mg/dL (ref 0–200)
HDL: 38.3 mg/dL — ABNORMAL LOW (ref >39.00)
LDL Cholesterol: 117 mg/dL — ABNORMAL HIGH (ref 0–99)
Total CHOL/HDL Ratio: 5
Triglycerides: 106 mg/dL (ref 0.0–149.0)
VLDL: 21.2 mg/dL (ref 0.0–40.0)

## 2011-10-07 LAB — HEMOGLOBIN A1C: Hgb A1c MFr Bld: 6.5 % (ref 4.6–6.5)

## 2011-10-07 LAB — TESTOSTERONE: Testosterone: 290.38 ng/dL — ABNORMAL LOW (ref 350.00–890.00)

## 2011-10-08 ENCOUNTER — Other Ambulatory Visit: Payer: Self-pay | Admitting: Family Medicine

## 2011-10-08 ENCOUNTER — Ambulatory Visit (INDEPENDENT_AMBULATORY_CARE_PROVIDER_SITE_OTHER): Payer: Medicare Other | Admitting: *Deleted

## 2011-10-08 DIAGNOSIS — Z79899 Other long term (current) drug therapy: Secondary | ICD-10-CM

## 2011-10-08 DIAGNOSIS — E119 Type 2 diabetes mellitus without complications: Secondary | ICD-10-CM

## 2011-10-08 DIAGNOSIS — E291 Testicular hypofunction: Secondary | ICD-10-CM

## 2011-10-08 DIAGNOSIS — Z23 Encounter for immunization: Secondary | ICD-10-CM | POA: Diagnosis not present

## 2011-10-08 DIAGNOSIS — Z125 Encounter for screening for malignant neoplasm of prostate: Secondary | ICD-10-CM

## 2011-10-08 DIAGNOSIS — E782 Mixed hyperlipidemia: Secondary | ICD-10-CM

## 2011-10-08 LAB — MICROALBUMIN / CREATININE URINE RATIO
Creatinine,U: 179.8 mg/dL
Microalb Creat Ratio: 0.2 mg/g (ref 0.0–30.0)
Microalb, Ur: 0.3 mg/dL (ref 0.0–1.9)

## 2011-10-08 NOTE — Progress Notes (Signed)
Pt presents for TDAP vaccine.  This is tolerated well in right deltoid.

## 2011-10-09 ENCOUNTER — Ambulatory Visit: Payer: Medicare Other

## 2011-10-14 ENCOUNTER — Other Ambulatory Visit: Payer: Self-pay | Admitting: Family Medicine

## 2011-10-14 MED ORDER — ATORVASTATIN CALCIUM 20 MG PO TABS: 20.0000 mg | ORAL_TABLET | Freq: Every day | ORAL | Status: DC

## 2011-10-14 NOTE — Progress Notes (Signed)
Atorvastatin sent per Result Note.

## 2011-10-23 ENCOUNTER — Ambulatory Visit (INDEPENDENT_AMBULATORY_CARE_PROVIDER_SITE_OTHER): Payer: Medicare Other | Admitting: *Deleted

## 2011-10-23 ENCOUNTER — Encounter: Payer: Self-pay | Admitting: Internal Medicine

## 2011-10-23 DIAGNOSIS — Z95810 Presence of automatic (implantable) cardiac defibrillator: Secondary | ICD-10-CM | POA: Diagnosis not present

## 2011-10-23 DIAGNOSIS — I469 Cardiac arrest, cause unspecified: Secondary | ICD-10-CM

## 2011-10-24 ENCOUNTER — Encounter: Payer: Self-pay | Admitting: *Deleted

## 2011-10-24 DIAGNOSIS — Z95810 Presence of automatic (implantable) cardiac defibrillator: Secondary | ICD-10-CM | POA: Insufficient documentation

## 2011-10-24 LAB — REMOTE ICD DEVICE
BATTERY VOLTAGE: 2.66 V
BRDY-0002RV: 40 {beats}/min
DEV-0020ICD: NEGATIVE
DEVICE MODEL ICD: 449388
HV IMPEDENCE: 55 Ohm
RV LEAD AMPLITUDE: 11.9 mv
RV LEAD IMPEDENCE ICD: 740 Ohm
TZAT-0001FASTVT: 1
TZAT-0001SLOWVT: 1
TZAT-0004FASTVT: 8
TZAT-0004SLOWVT: 8
TZAT-0012FASTVT: 200 ms
TZAT-0012SLOWVT: 200 ms
TZAT-0013FASTVT: 1
TZAT-0013SLOWVT: 4
TZAT-0018FASTVT: NEGATIVE
TZAT-0018SLOWVT: NEGATIVE
TZAT-0019FASTVT: 7.5 V
TZAT-0019SLOWVT: 7.5 V
TZAT-0020FASTVT: 1 ms
TZAT-0020SLOWVT: 1 ms
TZON-0003FASTVT: 300 ms
TZON-0003SLOWVT: 340 ms
TZON-0004FASTVT: 12
TZON-0004SLOWVT: 24
TZON-0005FASTVT: 6
TZON-0005SLOWVT: 6
TZON-0010FASTVT: 80 ms
TZON-0010SLOWVT: 80 ms
TZST-0001FASTVT: 2
TZST-0001FASTVT: 3
TZST-0001FASTVT: 4
TZST-0001FASTVT: 5
TZST-0001SLOWVT: 2
TZST-0001SLOWVT: 3
TZST-0001SLOWVT: 4
TZST-0001SLOWVT: 5
TZST-0003FASTVT: 36 J
TZST-0003FASTVT: 36 J
TZST-0003FASTVT: 36 J
TZST-0003FASTVT: 36 J
TZST-0003SLOWVT: 25 J
TZST-0003SLOWVT: 36 J
TZST-0003SLOWVT: 36 J
TZST-0003SLOWVT: 36 J
VENTRICULAR PACING ICD: 1 pct

## 2011-10-27 ENCOUNTER — Other Ambulatory Visit: Payer: Self-pay | Admitting: *Deleted

## 2011-10-27 MED ORDER — PANTOPRAZOLE SODIUM 40 MG PO TBEC: 40.0000 mg | DELAYED_RELEASE_TABLET | Freq: Every day | ORAL | Status: DC

## 2011-10-27 NOTE — Telephone Encounter (Signed)
Faxed refill request received from pharmacy for 90 day supply of pantoprazole Last filled by MD on 04/13/11, #90 x 3, sent to [facility].  Pt is changing pharmacy. RX sent.

## 2011-10-29 ENCOUNTER — Encounter: Payer: Self-pay | Admitting: *Deleted

## 2011-11-27 DIAGNOSIS — M65839 Other synovitis and tenosynovitis, unspecified forearm: Secondary | ICD-10-CM | POA: Diagnosis not present

## 2011-12-08 ENCOUNTER — Other Ambulatory Visit: Payer: Self-pay | Admitting: *Deleted

## 2011-12-08 MED ORDER — ATORVASTATIN CALCIUM 20 MG PO TABS: 20.0000 mg | ORAL_TABLET | Freq: Every day | ORAL | Status: DC

## 2011-12-08 NOTE — Telephone Encounter (Signed)
Faxed refill request received from pharmacy for 90DAY ATORVASTATIN Last filled by MD on 10/14/11, #30 X 1 Last seen on 09/16/11 Follow up 03/16/11  Pt is due for repeat lipids.  I spoke to his wife and pt is scheduled for fasting lipid on 12/18/11.  30 day supply sent to pharmacy.  Advised we can send 90 day once we know this is appropriate dose.  I noticed pt also needs testosterone check.  I called Arline Asp and advised her to let Mr. Andrus know to apply testosterone around 6 am.  She is agreeable.

## 2011-12-18 ENCOUNTER — Other Ambulatory Visit (INDEPENDENT_AMBULATORY_CARE_PROVIDER_SITE_OTHER): Payer: Medicare Other

## 2011-12-18 DIAGNOSIS — Z125 Encounter for screening for malignant neoplasm of prostate: Secondary | ICD-10-CM | POA: Diagnosis not present

## 2011-12-18 DIAGNOSIS — E119 Type 2 diabetes mellitus without complications: Secondary | ICD-10-CM | POA: Diagnosis not present

## 2011-12-18 DIAGNOSIS — Z79899 Other long term (current) drug therapy: Secondary | ICD-10-CM

## 2011-12-18 DIAGNOSIS — E782 Mixed hyperlipidemia: Secondary | ICD-10-CM

## 2011-12-18 DIAGNOSIS — E291 Testicular hypofunction: Secondary | ICD-10-CM

## 2011-12-18 LAB — LIPID PANEL
Cholesterol: 106 mg/dL (ref 0–200)
HDL: 33.4 mg/dL — ABNORMAL LOW (ref >39.00)
LDL Cholesterol: 53 mg/dL (ref 0–99)
Total CHOL/HDL Ratio: 3
Triglycerides: 98 mg/dL (ref 0.0–149.0)
VLDL: 19.6 mg/dL (ref 0.0–40.0)

## 2011-12-18 LAB — TESTOSTERONE: Testosterone: 355.85 ng/dL (ref 350.00–890.00)

## 2011-12-18 LAB — PSA, MEDICARE: PSA: 0.34 ng/ml (ref 0.10–4.00)

## 2012-01-06 ENCOUNTER — Other Ambulatory Visit: Payer: Self-pay | Admitting: Family Medicine

## 2012-01-06 ENCOUNTER — Telehealth: Payer: Self-pay | Admitting: Family Medicine

## 2012-01-06 MED ORDER — ATORVASTATIN CALCIUM 20 MG PO TABS: 20.0000 mg | ORAL_TABLET | Freq: Every day | ORAL | Status: DC

## 2012-01-06 NOTE — Telephone Encounter (Signed)
Refill request for ATORVASTATIN-90 DAY SUPPLY Last filled- 12/08/11, #30 x 0 Last seen- 09/16/11 Follow up - not noted Pt states he is completely out and leaving to go out of town and needs this sent to the pharmacy right now.  Advised I will work on it. Refill sent per [facility] refill protocol.

## 2012-01-26 ENCOUNTER — Ambulatory Visit (INDEPENDENT_AMBULATORY_CARE_PROVIDER_SITE_OTHER): Payer: Medicare Other | Admitting: *Deleted

## 2012-01-26 ENCOUNTER — Encounter: Payer: Self-pay | Admitting: Internal Medicine

## 2012-01-26 DIAGNOSIS — I469 Cardiac arrest, cause unspecified: Secondary | ICD-10-CM

## 2012-01-26 DIAGNOSIS — Z95810 Presence of automatic (implantable) cardiac defibrillator: Secondary | ICD-10-CM

## 2012-01-26 LAB — REMOTE ICD DEVICE
BATTERY VOLTAGE: 2.63 V
BRDY-0002RV: 40 {beats}/min
DEV-0020ICD: NEGATIVE
DEVICE MODEL ICD: 449388
HV IMPEDENCE: 53 Ohm
RV LEAD AMPLITUDE: 11.6 mv
RV LEAD IMPEDENCE ICD: 610 Ohm
TZAT-0001FASTVT: 1
TZAT-0001SLOWVT: 1
TZAT-0004FASTVT: 8
TZAT-0004SLOWVT: 8
TZAT-0012FASTVT: 200 ms
TZAT-0012SLOWVT: 200 ms
TZAT-0013FASTVT: 1
TZAT-0013SLOWVT: 4
TZAT-0018FASTVT: NEGATIVE
TZAT-0018SLOWVT: NEGATIVE
TZAT-0019FASTVT: 7.5 V
TZAT-0019SLOWVT: 7.5 V
TZAT-0020FASTVT: 1 ms
TZAT-0020SLOWVT: 1 ms
TZON-0003FASTVT: 300 ms
TZON-0003SLOWVT: 340 ms
TZON-0004FASTVT: 12
TZON-0004SLOWVT: 24
TZON-0005FASTVT: 6
TZON-0005SLOWVT: 6
TZON-0010FASTVT: 80 ms
TZON-0010SLOWVT: 80 ms
TZST-0001FASTVT: 2
TZST-0001FASTVT: 3
TZST-0001FASTVT: 4
TZST-0001FASTVT: 5
TZST-0001SLOWVT: 2
TZST-0001SLOWVT: 3
TZST-0001SLOWVT: 4
TZST-0001SLOWVT: 5
TZST-0003FASTVT: 36 J
TZST-0003FASTVT: 36 J
TZST-0003FASTVT: 36 J
TZST-0003FASTVT: 36 J
TZST-0003SLOWVT: 25 J
TZST-0003SLOWVT: 36 J
TZST-0003SLOWVT: 36 J
TZST-0003SLOWVT: 36 J
VENTRICULAR PACING ICD: 1 pct

## 2012-01-27 ENCOUNTER — Other Ambulatory Visit: Payer: Self-pay | Admitting: *Deleted

## 2012-01-27 MED ORDER — TESTOSTERONE 20.25 MG/ACT (1.62%) TD GEL: 60.7500 mg | Freq: Every day | TRANSDERMAL | Status: DC

## 2012-01-27 NOTE — Telephone Encounter (Signed)
Faxed to pharmacy

## 2012-01-27 NOTE — Telephone Encounter (Signed)
Faxed refill request received from pharmacy for ANDROGEL Last filled by MD on 07/07/11 X 5 REFILLS Last seen on 09/16/11 Follow up 6 MONTHS. Increased to 3 pumps/day on 10/31 Please advise refills.

## 2012-01-27 NOTE — Telephone Encounter (Signed)
Will authorize rx RFs, but pt needs to come by for lab visit to recheck testosterone level since we increased his dose at the end of October.  Have him time the lab appt 2-3 hours after putting on his testosterone for the day.--thx

## 2012-02-03 ENCOUNTER — Encounter: Payer: Self-pay | Admitting: *Deleted

## 2012-03-15 ENCOUNTER — Encounter: Payer: Self-pay | Admitting: Family Medicine

## 2012-03-15 ENCOUNTER — Ambulatory Visit (INDEPENDENT_AMBULATORY_CARE_PROVIDER_SITE_OTHER): Payer: Medicare Other | Admitting: Family Medicine

## 2012-03-15 VITALS — BP 122/70 | HR 76 | Temp 98.4°F | Ht 70.5 in | Wt 253.0 lb

## 2012-03-15 DIAGNOSIS — I872 Venous insufficiency (chronic) (peripheral): Secondary | ICD-10-CM | POA: Diagnosis not present

## 2012-03-15 DIAGNOSIS — R7309 Other abnormal glucose: Secondary | ICD-10-CM

## 2012-03-15 DIAGNOSIS — E119 Type 2 diabetes mellitus without complications: Secondary | ICD-10-CM | POA: Diagnosis not present

## 2012-03-15 DIAGNOSIS — R739 Hyperglycemia, unspecified: Secondary | ICD-10-CM | POA: Insufficient documentation

## 2012-03-15 DIAGNOSIS — E291 Testicular hypofunction: Secondary | ICD-10-CM | POA: Diagnosis not present

## 2012-03-15 MED ORDER — FUROSEMIDE 20 MG PO TABS: ORAL_TABLET | ORAL | Status: DC

## 2012-03-15 NOTE — Assessment & Plan Note (Signed)
Stable as per home monitoring. Check HbA1c today.

## 2012-03-15 NOTE — Progress Notes (Signed)
OFFICE NOTE  03/15/2012  CC:  Chief Complaint  Patient presents with  . Follow-up    DM, cholesterol; also having left ankle pain/swelling     HPI: Patient is a 77 y.o. Caucasian male who is here for 6 mo f/u DM 2, hyperlipidemia, hypogonadism. He is not monitoring glucoses b/c he kept getting normal readings. He has been compliant with meds.  Notes worsening of left lower leg several days ago: more swollen, hurts to touch in ankle area.  No trauma.  No pain in any other leg region.  No SOB or CP.  He has no compression hose, does not eat a low sodium diet.   Pertinent PMH:  Past Medical History  Diagnosis Date  . CAD (coronary artery disease)     CABG 1996  . Cardiac arrest 2008    AICD in right ventricle --placed afterward  . Cardiomyopathy, ischemic   . Type II or unspecified type diabetes mellitus without mention of complication, not stated as uncontrolled 03/2011  . Disorder of bone and cartilage, unspecified   . Anemia, unspecified   . OSA on CPAP     sleep study 2008, severe OSA  . Hypercholesterolemia   . GERD (gastroesophageal reflux disease)   . Hx of adenomatous colonic polyps 2003, 2013  . CRI (chronic renal insufficiency)     CrCl @50ml /min    MEDS:  Outpatient Prescriptions Prior to Visit  Medication Sig Dispense Refill  . aspirin 325 MG EC tablet Take 325 mg by mouth daily.      [name] atorvastatin (LIPITOR) 20 MG tablet Take 1 tablet (20 mg total) by mouth daily.  90 tablet  1  . b complex vitamins tablet Take 1 tablet by mouth daily.      . Calcium Carbonate (CALCIUM 500 PO) Take 1 tablet by mouth daily.      [name] DIGESTIVE ENZYMES PO Take 1 tablet by mouth as needed.       [name] glucose blood (FREESTYLE LITE) test strip Use as instructed to check blood sugar.  DX 250.0  100 each  3  . Lancets (FREESTYLE) lancets Use as directed to check blood sugar.  DX:250.0  100 each  5  . lisinopril (PRINIVIL,ZESTRIL) 5 MG tablet TAKE ONE TABLET BY MOUTH ONE TIME DAILY  90  tablet  0  . metoprolol succinate (TOPROL-XL) 50 MG 24 hr tablet TAKE 1 TABLET EVERY DAY  90 tablet  3  . Multiple Vitamin (MULTIVITAMIN) tablet Take 1 tablet by mouth daily.      . NON FORMULARY OPC 3.  Take 1 tablet by mouth daily.      . NON FORMULARY GLUCOSTATIN. Once daily.      . Omega-3 Fatty Acids (FISH OIL) 1000 MG CPDR Take 1 capsule by mouth daily.      . pantoprazole (PROTONIX) 40 MG tablet Take 1 tablet (40 mg total) by mouth daily.  90 tablet  3  . Testosterone (ANDROGEL PUMP) 20.25 MG/ACT (1.62%) GEL Place 60.75 mg onto the skin daily. (Distribute 3 pumps onto both shoulders and upper arms once every morning)  75 g  5  . VITAMIN D-VITAMIN K PO Take 1 tablet by mouth daily.      [name] KLOR-CON M20 20 MEQ tablet Take 1 tablet by mouth as needed.       . [DISCONTINUED] Coenzyme Q10 100 MG CHEW Chew by mouth daily.       Last reviewed on 03/15/2012  9:50 AM by Maryjean Morn  McGowen, MD  PE: Blood pressure 122/70, pulse 76, temperature 98.4 F (36.9 C), temperature source Temporal, height 5' 10.5" (1.791 m), weight 253 lb (114.76 kg), SpO2 93.00%. Gen: Alert, well appearing.  Patient is oriented to person, place, time, and situation. AFFECT: pleasant, lucid thought and speech. LEGS: from knees down he has 2+ pitting edema bilat, symmetric, +diffuse bronzing hyperpigmentation anteriorly. Left upper ankle with TTP in medial region, without warmth or erythema.  No calf or thigh or popliteal area TTP.   IMPRESSION AND PLAN:  Chronic venous insufficiency Reviewed basics of management of this condition: low Na intake, rx'd compression hose (knee high, 20-30 mm Hg comp) to wear during day as much as possible.  Lasix 20mg  1 qod prn.   Ambulate as much as possible.  Elevate legs above level of heart prn.  I think the reason he has acute recent worsening is possible new incompetent vein valve.  On exam today he has no sign of DVT or cellulitis.  Type II or unspecified type diabetes mellitus without  mention of complication, not stated as uncontrolled Stable as per home monitoring. Check HbA1c today.  Hypogonadism, male Needs recheck Testost level s/p increase in dose in Fall 2013.  Ordered today.   CMET, HbA1c, and testost level "future" ordered today--our lab tech is out today PLUS pt did not apply his testost today at all.  FOLLOW UP: 35mo--for CPE w/fasting labs

## 2012-03-15 NOTE — Assessment & Plan Note (Signed)
Needs recheck Testost level s/p increase in dose in Fall 2013.  Ordered today.

## 2012-03-15 NOTE — Assessment & Plan Note (Addendum)
Reviewed basics of management of this condition: low Na intake, rx'd compression hose (knee high, 20-30 mm Hg comp) to wear during day as much as possible.  Lasix 20mg  1 qod prn.   Ambulate as much as possible.  Elevate legs above level of heart prn.  I think the reason he has acute recent worsening is possible new incompetent vein valve.  On exam today he has no sign of DVT or cellulitis.

## 2012-03-15 NOTE — Patient Instructions (Signed)
Make lab appt time 2-4 hours AFTER applying your testosterone.

## 2012-03-17 ENCOUNTER — Telehealth: Payer: Self-pay | Admitting: *Deleted

## 2012-03-17 NOTE — Telephone Encounter (Signed)
Pt left voicemail on my phone on 03/16/12 in my absence regarding compression hose.  Voicemail was not checked. I called home number and spoke to wife-pt was not at home.  Apologized that message was not received yesterday and asked pt to call office and press option #1 and have someone find me.  She will do this.

## 2012-03-17 NOTE — Telephone Encounter (Signed)
Pt went on 03/16/12 to get stockings and was told by clerk that he would need to be measured and it would be better to be measured after his swelling is down.  Pt would like to know reasoning for this.  Advised that size matters for the amount of compression that is needed.  Explained that if he is measured while swollen, those stockings would be to big and would not provide the appropriate amount of compression once his swelling went down.  He voices understanding and will go back to [facility] supply tomorrow when they open.  Pt has been taking fluid pill.

## 2012-03-18 ENCOUNTER — Other Ambulatory Visit: Payer: Self-pay | Admitting: Family Medicine

## 2012-03-18 ENCOUNTER — Other Ambulatory Visit (INDEPENDENT_AMBULATORY_CARE_PROVIDER_SITE_OTHER): Payer: Medicare Other

## 2012-03-18 DIAGNOSIS — R7309 Other abnormal glucose: Secondary | ICD-10-CM

## 2012-03-18 DIAGNOSIS — R739 Hyperglycemia, unspecified: Secondary | ICD-10-CM

## 2012-03-18 DIAGNOSIS — E119 Type 2 diabetes mellitus without complications: Secondary | ICD-10-CM

## 2012-03-18 DIAGNOSIS — E291 Testicular hypofunction: Secondary | ICD-10-CM

## 2012-03-18 DIAGNOSIS — I872 Venous insufficiency (chronic) (peripheral): Secondary | ICD-10-CM

## 2012-03-18 DIAGNOSIS — N189 Chronic kidney disease, unspecified: Secondary | ICD-10-CM

## 2012-03-18 LAB — COMPREHENSIVE METABOLIC PANEL
ALT: 32 U/L (ref 0–53)
AST: 30 U/L (ref 0–37)
Albumin: 4.2 g/dL (ref 3.5–5.2)
Alkaline Phosphatase: 43 U/L (ref 39–117)
BUN: 23 mg/dL (ref 6–23)
CO2: 29 mEq/L (ref 19–32)
Calcium: 9.1 mg/dL (ref 8.4–10.5)
Chloride: 100 mEq/L (ref 96–112)
Creatinine, Ser: 1.8 mg/dL — ABNORMAL HIGH (ref 0.4–1.5)
GFR: 39.13 mL/min — ABNORMAL LOW (ref >60.00)
Glucose, Bld: 124 mg/dL — ABNORMAL HIGH (ref 70–99)
Potassium: 4.9 mEq/L (ref 3.5–5.1)
Sodium: 137 mEq/L (ref 135–145)
Total Bilirubin: 0.9 mg/dL (ref 0.3–1.2)
Total Protein: 7.3 g/dL (ref 6.0–8.3)

## 2012-03-18 LAB — HEMOGLOBIN A1C: Hgb A1c MFr Bld: 6.8 % — ABNORMAL HIGH (ref 4.6–6.5)

## 2012-03-18 LAB — TESTOSTERONE: Testosterone: 852.09 ng/dL (ref 350.00–890.00)

## 2012-03-18 NOTE — Progress Notes (Signed)
Labs only

## 2012-04-01 ENCOUNTER — Other Ambulatory Visit: Payer: Medicare Other

## 2012-04-03 ENCOUNTER — Other Ambulatory Visit: Payer: Self-pay

## 2012-04-05 ENCOUNTER — Other Ambulatory Visit: Payer: Self-pay | Admitting: *Deleted

## 2012-04-05 MED ORDER — LISINOPRIL 5 MG PO TABS: 5.0000 mg | ORAL_TABLET | Freq: Every day | ORAL | Status: DC

## 2012-04-05 MED ORDER — ATORVASTATIN CALCIUM 20 MG PO TABS: 20.0000 mg | ORAL_TABLET | Freq: Every day | ORAL | Status: DC

## 2012-04-05 NOTE — Telephone Encounter (Signed)
Pt left voicemail on 2/13 when office was closed.  PC to pt to confirm voicemail receipt and to verify pharmacy.  Advised refills would be sent today.  Refill request for LISINOPRIL Last filled- 01/06/12, #90 X 0  Refill request for ATORVASTATIN Last filled- 01/06/12, #90 X 1 Last seen: 03/15/12 Follow up: 4 months RX's sent.    Refill request for PANTOPRAZOLE Last filled- 10/27/11, #90 X 3 Per pharmacy, pt has remaining refills.  They will process and have ready for patient.  Advised two more electronic RX's should be coming to them soon.

## 2012-04-08 ENCOUNTER — Other Ambulatory Visit (INDEPENDENT_AMBULATORY_CARE_PROVIDER_SITE_OTHER): Payer: Medicare Other

## 2012-04-08 DIAGNOSIS — N189 Chronic kidney disease, unspecified: Secondary | ICD-10-CM

## 2012-04-08 DIAGNOSIS — E291 Testicular hypofunction: Secondary | ICD-10-CM

## 2012-04-08 LAB — BASIC METABOLIC PANEL
BUN: 21 mg/dL (ref 6–23)
CO2: 30 mEq/L (ref 19–32)
Calcium: 9 mg/dL (ref 8.4–10.5)
Chloride: 104 mEq/L (ref 96–112)
Creatinine, Ser: 1.7 mg/dL — ABNORMAL HIGH (ref 0.4–1.5)
GFR: 40.95 mL/min — ABNORMAL LOW (ref >60.00)
Glucose, Bld: 128 mg/dL — ABNORMAL HIGH (ref 70–99)
Potassium: 4.4 mEq/L (ref 3.5–5.1)
Sodium: 139 mEq/L (ref 135–145)

## 2012-04-08 LAB — CBC WITH DIFFERENTIAL/PLATELET
Basophils Absolute: 0 10*3/uL (ref 0.0–0.1)
Basophils Relative: 0.3 % (ref 0.0–3.0)
Eosinophils Absolute: 0.1 10*3/uL (ref 0.0–0.7)
Eosinophils Relative: 1.3 % (ref 0.0–5.0)
HCT: 46.7 % (ref 39.0–52.0)
Hemoglobin: 15.6 g/dL (ref 13.0–17.0)
Lymphocytes Relative: 30.2 % (ref 12.0–46.0)
Lymphs Abs: 1.9 10*3/uL (ref 0.7–4.0)
MCHC: 33.5 g/dL (ref 30.0–36.0)
MCV: 90.1 fl (ref 78.0–100.0)
Monocytes Absolute: 0.7 10*3/uL (ref 0.1–1.0)
Monocytes Relative: 10.9 % (ref 3.0–12.0)
Neutro Abs: 3.6 10*3/uL (ref 1.4–7.7)
Neutrophils Relative %: 57.3 % (ref 43.0–77.0)
Platelets: 197 10*3/uL (ref 150.0–400.0)
RBC: 5.18 Mil/uL (ref 4.22–5.81)
RDW: 13.3 % (ref 11.5–14.6)
WBC: 6.4 10*3/uL (ref 4.5–10.5)

## 2012-04-08 NOTE — Progress Notes (Signed)
Labs only

## 2012-04-13 ENCOUNTER — Other Ambulatory Visit: Payer: Self-pay | Admitting: Family Medicine

## 2012-04-13 DIAGNOSIS — I872 Venous insufficiency (chronic) (peripheral): Secondary | ICD-10-CM

## 2012-04-22 DIAGNOSIS — I872 Venous insufficiency (chronic) (peripheral): Secondary | ICD-10-CM | POA: Diagnosis not present

## 2012-04-22 DIAGNOSIS — I8310 Varicose veins of unspecified lower extremity with inflammation: Secondary | ICD-10-CM | POA: Diagnosis not present

## 2012-04-22 DIAGNOSIS — M79609 Pain in unspecified limb: Secondary | ICD-10-CM | POA: Diagnosis not present

## 2012-04-27 DIAGNOSIS — M79609 Pain in unspecified limb: Secondary | ICD-10-CM | POA: Diagnosis not present

## 2012-04-27 DIAGNOSIS — I8310 Varicose veins of unspecified lower extremity with inflammation: Secondary | ICD-10-CM | POA: Diagnosis not present

## 2012-04-27 DIAGNOSIS — I872 Venous insufficiency (chronic) (peripheral): Secondary | ICD-10-CM | POA: Diagnosis not present

## 2012-05-09 ENCOUNTER — Encounter (HOSPITAL_BASED_OUTPATIENT_CLINIC_OR_DEPARTMENT_OTHER): Payer: Self-pay | Admitting: *Deleted

## 2012-05-09 ENCOUNTER — Emergency Department (HOSPITAL_BASED_OUTPATIENT_CLINIC_OR_DEPARTMENT_OTHER)
Admission: EM | Admit: 2012-05-09 | Discharge: 2012-05-09 | Disposition: A | Discharge status: Home / Self Care | Payer: Medicare Other | Attending: Emergency Medicine | Admitting: Emergency Medicine

## 2012-05-09 ENCOUNTER — Emergency Department (HOSPITAL_BASED_OUTPATIENT_CLINIC_OR_DEPARTMENT_OTHER): Payer: Medicare Other

## 2012-05-09 DIAGNOSIS — Z951 Presence of aortocoronary bypass graft: Secondary | ICD-10-CM | POA: Insufficient documentation

## 2012-05-09 DIAGNOSIS — Z8679 Personal history of other diseases of the circulatory system: Secondary | ICD-10-CM | POA: Insufficient documentation

## 2012-05-09 DIAGNOSIS — Z23 Encounter for immunization: Secondary | ICD-10-CM | POA: Insufficient documentation

## 2012-05-09 DIAGNOSIS — Z7982 Long term (current) use of aspirin: Secondary | ICD-10-CM | POA: Diagnosis not present

## 2012-05-09 DIAGNOSIS — Z8739 Personal history of other diseases of the musculoskeletal system and connective tissue: Secondary | ICD-10-CM | POA: Insufficient documentation

## 2012-05-09 DIAGNOSIS — Z79899 Other long term (current) drug therapy: Secondary | ICD-10-CM | POA: Insufficient documentation

## 2012-05-09 DIAGNOSIS — N189 Chronic kidney disease, unspecified: Secondary | ICD-10-CM | POA: Diagnosis not present

## 2012-05-09 DIAGNOSIS — G4733 Obstructive sleep apnea (adult) (pediatric): Secondary | ICD-10-CM | POA: Diagnosis not present

## 2012-05-09 DIAGNOSIS — Z860100 Personal history of colon polyps, unspecified: Secondary | ICD-10-CM | POA: Insufficient documentation

## 2012-05-09 DIAGNOSIS — Z9981 Dependence on supplemental oxygen: Secondary | ICD-10-CM | POA: Insufficient documentation

## 2012-05-09 DIAGNOSIS — Z87891 Personal history of nicotine dependence: Secondary | ICD-10-CM | POA: Diagnosis not present

## 2012-05-09 DIAGNOSIS — E7800 Pure hypercholesterolemia, unspecified: Secondary | ICD-10-CM | POA: Diagnosis not present

## 2012-05-09 DIAGNOSIS — E119 Type 2 diabetes mellitus without complications: Secondary | ICD-10-CM | POA: Diagnosis not present

## 2012-05-09 DIAGNOSIS — Z862 Personal history of diseases of the blood and blood-forming organs and certain disorders involving the immune mechanism: Secondary | ICD-10-CM | POA: Insufficient documentation

## 2012-05-09 DIAGNOSIS — Y9389 Activity, other specified: Secondary | ICD-10-CM | POA: Insufficient documentation

## 2012-05-09 DIAGNOSIS — K219 Gastro-esophageal reflux disease without esophagitis: Secondary | ICD-10-CM | POA: Insufficient documentation

## 2012-05-09 DIAGNOSIS — IMO0002 Reserved for concepts with insufficient information to code with codable children: Secondary | ICD-10-CM | POA: Insufficient documentation

## 2012-05-09 DIAGNOSIS — Z95810 Presence of automatic (implantable) cardiac defibrillator: Secondary | ICD-10-CM | POA: Insufficient documentation

## 2012-05-09 DIAGNOSIS — I2510 Atherosclerotic heart disease of native coronary artery without angina pectoris: Secondary | ICD-10-CM | POA: Insufficient documentation

## 2012-05-09 DIAGNOSIS — S68119A Complete traumatic metacarpophalangeal amputation of unspecified finger, initial encounter: Secondary | ICD-10-CM

## 2012-05-09 DIAGNOSIS — Y929 Unspecified place or not applicable: Secondary | ICD-10-CM | POA: Insufficient documentation

## 2012-05-09 DIAGNOSIS — S6990XA Unspecified injury of unspecified wrist, hand and finger(s), initial encounter: Secondary | ICD-10-CM | POA: Diagnosis not present

## 2012-05-09 DIAGNOSIS — W298XXA Contact with other powered powered hand tools and household machinery, initial encounter: Secondary | ICD-10-CM | POA: Insufficient documentation

## 2012-05-09 DIAGNOSIS — S6980XA Other specified injuries of unspecified wrist, hand and finger(s), initial encounter: Secondary | ICD-10-CM | POA: Diagnosis not present

## 2012-05-09 MED ORDER — CEPHALEXIN 500 MG PO CAPS: 500.0000 mg | ORAL_CAPSULE | Freq: Two times a day (BID) | ORAL | Status: DC

## 2012-05-09 MED ORDER — TETANUS-DIPHTH-ACELL PERTUSSIS 5-2.5-18.5 LF-MCG/0.5 IM SUSP: 0.5000 mL | Freq: Once | INTRAMUSCULAR | Status: AC

## 2012-05-09 MED ORDER — BUPIVACAINE HCL (PF) 0.5 % IJ SOLN
INTRAMUSCULAR | Status: AC
  Administered 2012-05-09: 50 mg
  Filled 2012-05-09: qty 10

## 2012-05-09 MED ORDER — OXYCODONE-ACETAMINOPHEN 5-325 MG PO TABS
2.0000 | ORAL_TABLET | Freq: Once | ORAL | Status: AC
  Administered 2012-05-09: 2 via ORAL
  Filled 2012-05-09 (×2): qty 2

## 2012-05-09 MED ORDER — "THROMBI-PAD 3""X3"" EX PADS"
MEDICATED_PAD | CUTANEOUS | Status: AC
  Filled 2012-05-09: qty 1

## 2012-05-09 MED ORDER — TETANUS-DIPHTH-ACELL PERTUSSIS 5-2.5-18.5 LF-MCG/0.5 IM SUSP
INTRAMUSCULAR | Status: AC
  Administered 2012-05-09: 0.5 mL via INTRAMUSCULAR
  Filled 2012-05-09: qty 0.5

## 2012-05-09 MED ORDER — ONDANSETRON 4 MG PO TBDP
ORAL_TABLET | ORAL | Status: AC
  Administered 2012-05-09: 4 mg via ORAL
  Filled 2012-05-09: qty 1

## 2012-05-09 MED ORDER — OXYCODONE-ACETAMINOPHEN 5-325 MG PO TABS: 2.0000 | ORAL_TABLET | Freq: Once | ORAL | Status: DC

## 2012-05-09 MED ORDER — ONDANSETRON 4 MG PO TBDP: 4.0000 mg | ORAL_TABLET | Freq: Once | ORAL | Status: AC

## 2012-05-09 NOTE — ED Notes (Signed)
Pt states he was using a circular saw and accidentally cut the tip of his left middle finger ?partially off. Bleeding controlled with pressure. Saline dressing applied. Feels touch.

## 2012-05-09 NOTE — ED Provider Notes (Signed)
History    This chart was scribed for Nelia Shi, MD by Toya Smothers, ED Scribe. The patient was seen in room MH01/MH01. Patient's care was started at 1631.  CSN: [id number]  Arrival date & time 05/09/12  1631   First MD Initiated Contact with Patient 05/09/12 1652      Chief Complaint  Patient presents with  . Finger Injury    The history is provided by the patient. No language interpreter was used.    [name] is a 77 y.o. male with h/o DM Type II, Anemia, and CRI, who presents to the Emergency Department complaining of a laceration to the distal portion of the left third phalengeal. Pain is moderate and described as soreness. Pt reports that he cut his finger on a circular saw while cutting a wooden door frame. Bleeding was mild, though not controlled prior to arrival. Pain has not been treated PTA. No foreign body suspected. No weakness, fever, chills, cough, congestion, rhinorrhea, chest pain, SOB, or n/v/d. Pt admits occasional alcohol use, denying use of tobacco and illicit drug use. No pertinent surgical Hx denoted. Pt does not recall last Tetanus vaccination.    Past Medical History  Diagnosis Date  . CAD (coronary artery disease)     CABG 1996  . Cardiac arrest 2008    AICD in right ventricle --placed afterward  . Cardiomyopathy, ischemic   . Type II or unspecified type diabetes mellitus without mention of complication, not stated as uncontrolled 03/2011  . Disorder of bone and cartilage, unspecified   . Anemia, unspecified   . OSA on CPAP     sleep study 2008, severe OSA  . Hypercholesterolemia   . GERD (gastroesophageal reflux disease)   . Hx of adenomatous colonic polyps 2003, 2013  . CRI (chronic renal insufficiency)     CrCl @50ml /min    Past Surgical History  Procedure Laterality Date  . Coronary artery bypass graft  1996    3 vessel  . Pacemaker placement  07/2006    St Jude current VRRF  . Colonoscopy w/ polypectomy  2003    Adenomatous; repeat  2006 was recommended but this was not done until 06/10/2011 (by different GI MD)  . Colonoscopy  06/10/11    One diminutive polyp removed (tubular adenoma, no high grade dysplasia).  Repeat colonoscopy 5 yrs (Dr. Christella Hartigan).  . Polypectomy    . Cardiac defibrillator placement      Family History  Problem Relation Age of Onset  . Heart disease Mother   . Stroke Mother   . Heart disease Father   . Colon cancer Neg Hx   . Esophageal cancer Neg Hx   . Stomach cancer Neg Hx   . Rectal cancer Neg Hx     History  Substance Use Topics  . Smoking status: Former Smoker    Types: Cigarettes    Quit date: 02/17/1970  . Smokeless tobacco: Never Used  . Alcohol Use: 4.2 oz/week    7 Glasses of wine per week   Review of Systems  Skin: Positive for wound.  All other systems reviewed and are negative.    Allergies  Naproxen sodium  Home Medications   Current Outpatient Rx  Name  Route  Sig  Dispense  Refill  . aspirin 325 MG EC tablet   Oral   Take 325 mg by mouth daily.         [name] atorvastatin (LIPITOR) 20 MG tablet   Oral   Take  1 tablet (20 mg total) by mouth daily.   90 tablet   1   . b complex vitamins tablet   Oral   Take 1 tablet by mouth daily.         . Calcium Carbonate (CALCIUM 500 PO)   Oral   Take 1 tablet by mouth daily.         . cephALEXin (KEFLEX) 500 MG capsule   Oral   Take 1 capsule (500 mg total) by mouth 2 (two) times daily.   10 capsule   0   . DIGESTIVE ENZYMES PO   Oral   Take 1 tablet by mouth as needed.          . furosemide (LASIX) 20 MG tablet      1 tab po qod as needed for increase in lower extremity swelling   30 tablet   1   . EXPIRED: glucose blood (FREESTYLE LITE) test strip      Use as instructed to check blood sugar.  DX 250.0   100 each   3   . KLOR-CON M20 20 MEQ tablet   Oral   Take 1 tablet by mouth as needed.          [name] EXPIRED: Lancets (FREESTYLE) lancets      Use as directed to check blood sugar.   DX:250.0   100 each   5   . lisinopril (PRINIVIL,ZESTRIL) 5 MG tablet   Oral   Take 1 tablet (5 mg total) by mouth daily.   90 tablet   1   . metoprolol succinate (TOPROL-XL) 50 MG 24 hr tablet      TAKE 1 TABLET EVERY DAY   90 tablet   3   . Multiple Vitamin (MULTIVITAMIN) tablet   Oral   Take 1 tablet by mouth daily.         . NON FORMULARY      OPC 3.  Take 1 tablet by mouth daily.         . NON FORMULARY      GLUCOSTATIN. Once daily.         . Omega-3 Fatty Acids (FISH OIL) 1000 MG CPDR   Oral   Take 1 capsule by mouth daily.         [name] oxyCODONE-acetaminophen (PERCOCET/ROXICET) 5-325 MG per tablet   Oral   Take 2 tablets by mouth once.   30 tablet   0   . pantoprazole (PROTONIX) 40 MG tablet   Oral   Take 1 tablet (40 mg total) by mouth daily.   90 tablet   3   . Testosterone (ANDROGEL PUMP) 20.25 MG/ACT (1.62%) GEL   Transdermal   Place 60.75 mg onto the skin daily. (Distribute 3 pumps onto both shoulders and upper arms once every morning)   75 g   5   . VITAMIN D-VITAMIN K PO   Oral   Take 1 tablet by mouth daily.           BP 153/85  Pulse 72  Temp(Src) 97.4 F (36.3 C) (Oral)  Resp 20  Ht 6' (1.829 m)  Wt 232 lb (105.235 kg)  BMI 31.46 kg/m2  SpO2 96%  Physical Exam  Nursing note and vitals reviewed. Constitutional: He is oriented to person, place, and time. He appears well-developed and well-nourished. No distress.  HENT:  Head: Normocephalic and atraumatic.  Eyes: Pupils are equal, round, and reactive to light.  Neck: Normal range of motion.  Cardiovascular: Normal rate and intact distal pulses.   Pulmonary/Chest: No respiratory distress.  Abdominal: Normal appearance. He exhibits no distension.  Musculoskeletal:       Left hand: He exhibits normal range of motion.       Hands: Neurological: He is alert and oriented to person, place, and time. No cranial nerve deficit.  Skin: Skin is warm and dry. No rash noted.   Psychiatric: He has a normal mood and affect. His behavior is normal.   A digital block was attempted using 0.5% bupivacaine.  Patient still experienced some pain at the area was debrided.  Bleeding controlled but still oozing.  Discussed the case with hand surgeon.  Plan at this time is to continue with pressure and observe.  If bleeding continues we'll transfer to calm if bleeding stop then he will be seen in the office tomorrow. ED Course  Procedures DIAGNOSTIC STUDIES: Oxygen Saturation is 96% on room air, adequate by my interpretation.    COORDINATION OF CARE: 16:43- Ordered DG Finger Middle Left 1 time imaging. 16:54- Evaluated Pt. Pt is awake, alert, and without distress. 16:58- Patient understand and agree with initial ED impression and plan with expectations set for ED visit.    Labs Reviewed - No data to display Dg Finger Middle Left  05/09/2012  *RADIOLOGY REPORT*  Clinical Data: Saw injury.  LEFT MIDDLE FINGER 2+V  Comparison: None.  Findings: There is a soft tissue defect involving the distal tip of the left middle finger but no bony injury.  No radiopaque foreign body.  Joint spaces are maintained.  IMPRESSION: Soft tissue defect but no acute bony findings.   Original Report Authenticated By: Rudie Meyer, M.D.      1. Traumatic amputation of fingertip, initial encounter       MDM  I personally performed the services described in this documentation, which was scribed in my presence. The recorded information has been reviewed and considered. Bleeding has stopped so patient will be seen as outpatient by Dr. Karmen Bongo, MD 05/09/12 [phone]

## 2012-05-12 DIAGNOSIS — S61209A Unspecified open wound of unspecified finger without damage to nail, initial encounter: Secondary | ICD-10-CM | POA: Diagnosis not present

## 2012-05-13 DIAGNOSIS — S61209A Unspecified open wound of unspecified finger without damage to nail, initial encounter: Secondary | ICD-10-CM | POA: Diagnosis not present

## 2012-05-14 DIAGNOSIS — S61209A Unspecified open wound of unspecified finger without damage to nail, initial encounter: Secondary | ICD-10-CM | POA: Diagnosis not present

## 2012-05-17 DIAGNOSIS — S61209A Unspecified open wound of unspecified finger without damage to nail, initial encounter: Secondary | ICD-10-CM | POA: Diagnosis not present

## 2012-05-19 ENCOUNTER — Encounter: Payer: Self-pay | Admitting: Internal Medicine

## 2012-05-19 ENCOUNTER — Ambulatory Visit (INDEPENDENT_AMBULATORY_CARE_PROVIDER_SITE_OTHER): Payer: Medicare Other | Admitting: Internal Medicine

## 2012-05-19 VITALS — BP 122/66 | HR 58 | Ht 72.0 in | Wt 237.8 lb

## 2012-05-19 DIAGNOSIS — I469 Cardiac arrest, cause unspecified: Secondary | ICD-10-CM

## 2012-05-19 DIAGNOSIS — Z95810 Presence of automatic (implantable) cardiac defibrillator: Secondary | ICD-10-CM | POA: Diagnosis not present

## 2012-05-19 DIAGNOSIS — S61209A Unspecified open wound of unspecified finger without damage to nail, initial encounter: Secondary | ICD-10-CM | POA: Diagnosis not present

## 2012-05-19 DIAGNOSIS — I2510 Atherosclerotic heart disease of native coronary artery without angina pectoris: Secondary | ICD-10-CM

## 2012-05-19 LAB — ICD DEVICE OBSERVATION
BATTERY VOLTAGE: 2.617 V
BRDY-0002RV: 40 {beats}/min
CHARGE TIME: 12.9 s
DEV-0020ICD: NEGATIVE
DEVICE MODEL ICD: 449388
FVT: 0
HV IMPEDENCE: 55 Ohm
PACEART VT: 0
RV LEAD AMPLITUDE: 11.4 mv
RV LEAD IMPEDENCE ICD: 650 Ohm
RV LEAD THRESHOLD: 1 V
TOT-0007: 3
TOT-0008: 0
TOT-0009: 2
TOT-0010: 33
TZAT-0001FASTVT: 1
TZAT-0001SLOWVT: 1
TZAT-0004FASTVT: 8
TZAT-0004SLOWVT: 8
TZAT-0012FASTVT: 200 ms
TZAT-0012SLOWVT: 200 ms
TZAT-0013FASTVT: 1
TZAT-0013SLOWVT: 4
TZAT-0018FASTVT: NEGATIVE
TZAT-0018SLOWVT: NEGATIVE
TZAT-0019FASTVT: 7.5 V
TZAT-0019SLOWVT: 7.5 V
TZAT-0020FASTVT: 1 ms
TZAT-0020SLOWVT: 1 ms
TZON-0003FASTVT: 300 ms
TZON-0003SLOWVT: 340 ms
TZON-0004FASTVT: 12
TZON-0004SLOWVT: 24
TZON-0005FASTVT: 6
TZON-0005SLOWVT: 6
TZON-0010FASTVT: 80 ms
TZON-0010SLOWVT: 80 ms
TZST-0001FASTVT: 2
TZST-0001FASTVT: 3
TZST-0001FASTVT: 4
TZST-0001FASTVT: 5
TZST-0001SLOWVT: 2
TZST-0001SLOWVT: 3
TZST-0001SLOWVT: 4
TZST-0001SLOWVT: 5
TZST-0003FASTVT: 36 J
TZST-0003FASTVT: 36 J
TZST-0003FASTVT: 36 J
TZST-0003FASTVT: 36 J
TZST-0003SLOWVT: 25 J
TZST-0003SLOWVT: 36 J
TZST-0003SLOWVT: 36 J
TZST-0003SLOWVT: 36 J
VENTRICULAR PACING ICD: 0 pct
VF: 0

## 2012-05-19 MED ORDER — ASPIRIN EC 81 MG PO TBEC: 81.0000 mg | DELAYED_RELEASE_TABLET | Freq: Every day | ORAL | Status: DC

## 2012-05-19 NOTE — Patient Instructions (Addendum)
Your physician has recommended you make the following change in your medication: DECREASE your aspirin to 81 mg daily  Your physician wants you to follow-up in: 1 year.   You will receive a reminder letter in the mail two months in advance. If you don't receive a letter, please call our office to schedule the follow-up appointment.

## 2012-05-19 NOTE — Progress Notes (Signed)
Patient Care Team: Jeoffrey Massed, MD as PCP - General (Family Medicine) Rachael Fee, MD as Consulting Physician (Gastroenterology)   HPI  [name] is a 77 y.o. male is seen in followup for abortive cardiac arrest in the setting of modest ischemic cardiomyopathy with prior bypass surgery and an ejection fraction of 40-45%. He is status post ICD implantation He has had no recurrent events. He has remote syncope.  The patient denies SOB, chest pain, or palpitations; he does have some peripheral edema which is recurrent.     Past Medical History  Diagnosis Date  . CAD (coronary artery disease)     CABG 1996  . Cardiac arrest 2008    AICD in right ventricle --placed afterward  . Cardiomyopathy, ischemic   . Type II or unspecified type diabetes mellitus without mention of complication, not stated as uncontrolled 03/2011  . Disorder of bone and cartilage, unspecified   . Anemia, unspecified   . OSA on CPAP     sleep study 2008, severe OSA  . Hypercholesterolemia   . GERD (gastroesophageal reflux disease)   . Hx of adenomatous colonic polyps 2003, 2013  . CRI (chronic renal insufficiency)     CrCl @50ml /min    Past Surgical History  Procedure Laterality Date  . Coronary artery bypass graft  1996    3 vessel  . Pacemaker placement  07/2006    St Jude current VRRF  . Colonoscopy w/ polypectomy  2003    Adenomatous; repeat 2006 was recommended but this was not done until 06/10/2011 (by different GI MD)  . Colonoscopy  06/10/11    One diminutive polyp removed (tubular adenoma, no high grade dysplasia).  Repeat colonoscopy 5 yrs (Dr. Christella Hartigan).  . Polypectomy    . Cardiac defibrillator placement      Current Outpatient Prescriptions  Medication Sig Dispense Refill  . aspirin 325 MG EC tablet Take 325 mg by mouth daily.      [name] atorvastatin (LIPITOR) 20 MG tablet Take 1 tablet (20 mg total) by mouth daily.  90 tablet  1  . b complex vitamins tablet Take 1 tablet by mouth  daily.      . Calcium Carbonate (CALCIUM 500 PO) Take 1 tablet by mouth daily.      . cephALEXin (KEFLEX) 500 MG capsule Take 1 capsule (500 mg total) by mouth 2 (two) times daily.  10 capsule  0  . DIGESTIVE ENZYMES PO Take 1 tablet by mouth as needed.       . furosemide (LASIX) 20 MG tablet 1 tab po qod as needed for increase in lower extremity swelling  30 tablet  1  . KLOR-CON M20 20 MEQ tablet Take 1 tablet by mouth as needed.       . Lancets (FREESTYLE) lancets Use as directed to check blood sugar.  DX:250.0  100 each  5  . lisinopril (PRINIVIL,ZESTRIL) 5 MG tablet Take 1 tablet (5 mg total) by mouth daily.  90 tablet  1  . metoprolol succinate (TOPROL-XL) 50 MG 24 hr tablet TAKE 1 TABLET EVERY DAY  90 tablet  3  . Multiple Vitamin (MULTIVITAMIN) tablet Take 1 tablet by mouth daily.      . NON FORMULARY OPC 3.  Take 1 tablet by mouth daily.      . NON FORMULARY GLUCOSTATIN. Once daily.      . Omega-3 Fatty Acids (FISH OIL) 1000 MG CPDR Take 1 capsule by mouth daily.      [name]  oxyCODONE-acetaminophen (PERCOCET/ROXICET) 5-325 MG per tablet Take 2 tablets by mouth once.  30 tablet  0  . pantoprazole (PROTONIX) 40 MG tablet Take 1 tablet (40 mg total) by mouth daily.  90 tablet  3  . Testosterone (ANDROGEL PUMP) 20.25 MG/ACT (1.62%) GEL Place 60.75 mg onto the skin daily. (Distribute 3 pumps onto both shoulders and upper arms once every morning)  75 g  5  . VITAMIN D-VITAMIN K PO Take 1 tablet by mouth daily.       No current facility-administered medications for this visit.    Allergies  Allergen Reactions  . Naproxen Sodium Nausea Only    Review of Systems negative except from HPI and PMH  Physical Exam BP 122/66  Pulse 58  Ht 6' (1.829 m)  Wt 237 lb 12.8 oz (107.865 kg)  BMI 32.24 kg/m2 Well developed and nourished in no acute distress, but he looks exhausted. HENT normal Neck supple with JVP-flat Clear Regular rate and rhythm, no murmurs or gallops Abd-soft with active  BS No Clubbing cyanosis edema; there is a bandage on his hand from the finger injury with a saw 2 weeks ago Skin-warm and dry A & Oriented  Grossly normal sensory and motor function     Assessment and  Plan

## 2012-05-19 NOTE — Assessment & Plan Note (Signed)
No intercurrent Ventricular tachycardia  

## 2012-05-19 NOTE — Assessment & Plan Note (Signed)
Stable on current meds;  Would anticipate echo at next office visit

## 2012-05-19 NOTE — Assessment & Plan Note (Signed)
The patient's device was interrogated.  The information was reviewed. No changes were made in the programming.    

## 2012-05-20 ENCOUNTER — Other Ambulatory Visit: Payer: Self-pay | Admitting: Family Medicine

## 2012-05-21 DIAGNOSIS — S61209A Unspecified open wound of unspecified finger without damage to nail, initial encounter: Secondary | ICD-10-CM | POA: Diagnosis not present

## 2012-05-21 NOTE — Telephone Encounter (Signed)
eScribe request for refill on METOPROLOL SUCCINATE Last filled - 04/13/11, #90 X 3 Last seen on - 03/15/12 Follow up - 4 MONTHS RX sent

## 2012-05-25 DIAGNOSIS — S61209A Unspecified open wound of unspecified finger without damage to nail, initial encounter: Secondary | ICD-10-CM | POA: Diagnosis not present

## 2012-06-11 ENCOUNTER — Encounter: Payer: Self-pay | Admitting: Internal Medicine

## 2012-06-17 DIAGNOSIS — I8310 Varicose veins of unspecified lower extremity with inflammation: Secondary | ICD-10-CM | POA: Diagnosis not present

## 2012-06-24 DIAGNOSIS — I8310 Varicose veins of unspecified lower extremity with inflammation: Secondary | ICD-10-CM | POA: Diagnosis not present

## 2012-06-28 DIAGNOSIS — I8310 Varicose veins of unspecified lower extremity with inflammation: Secondary | ICD-10-CM | POA: Diagnosis not present

## 2012-07-06 ENCOUNTER — Other Ambulatory Visit (INDEPENDENT_AMBULATORY_CARE_PROVIDER_SITE_OTHER): Payer: Medicare Other

## 2012-07-06 ENCOUNTER — Encounter: Payer: Self-pay | Admitting: Family Medicine

## 2012-07-06 ENCOUNTER — Ambulatory Visit (INDEPENDENT_AMBULATORY_CARE_PROVIDER_SITE_OTHER): Payer: Medicare Other | Admitting: Family Medicine

## 2012-07-06 VITALS — BP 131/82 | HR 65 | Temp 98.2°F | Resp 16 | Ht 71.0 in | Wt 231.0 lb

## 2012-07-06 DIAGNOSIS — E119 Type 2 diabetes mellitus without complications: Secondary | ICD-10-CM

## 2012-07-06 DIAGNOSIS — E785 Hyperlipidemia, unspecified: Secondary | ICD-10-CM

## 2012-07-06 DIAGNOSIS — Z0000 Encounter for general adult medical examination without abnormal findings: Secondary | ICD-10-CM | POA: Diagnosis not present

## 2012-07-06 LAB — CBC
HCT: 44 % (ref 39.0–52.0)
Hemoglobin: 14.5 g/dL (ref 13.0–17.0)
MCHC: 33 g/dL (ref 30.0–36.0)
MCV: 90.6 fl (ref 78.0–100.0)
Platelets: 179 10*3/uL (ref 150.0–400.0)
RBC: 4.86 Mil/uL (ref 4.22–5.81)
RDW: 14.6 % (ref 11.5–14.6)
WBC: 6.8 10*3/uL (ref 4.5–10.5)

## 2012-07-06 LAB — BASIC METABOLIC PANEL
BUN: 22 mg/dL (ref 6–23)
CO2: 29 mEq/L (ref 19–32)
Calcium: 9.3 mg/dL (ref 8.4–10.5)
Chloride: 105 mEq/L (ref 96–112)
Creatinine, Ser: 1.4 mg/dL (ref 0.4–1.5)
GFR: 53.47 mL/min — ABNORMAL LOW (ref >60.00)
Glucose, Bld: 123 mg/dL — ABNORMAL HIGH (ref 70–99)
Potassium: 4.1 mEq/L (ref 3.5–5.1)
Sodium: 139 mEq/L (ref 135–145)

## 2012-07-06 LAB — LIPID PANEL
Cholesterol: 101 mg/dL (ref 0–200)
HDL: 31.4 mg/dL — ABNORMAL LOW (ref >39.00)
LDL Cholesterol: 58 mg/dL (ref 0–99)
Total CHOL/HDL Ratio: 3
Triglycerides: 58 mg/dL (ref 0.0–149.0)
VLDL: 11.6 mg/dL (ref 0.0–40.0)

## 2012-07-06 LAB — HEMOGLOBIN A1C: Hgb A1c MFr Bld: 7.1 % — ABNORMAL HIGH (ref 4.6–6.5)

## 2012-07-06 NOTE — Progress Notes (Signed)
Office Note 07/06/2012  CC:  Chief Complaint  Patient presents with  . Annual Exam    HPI:  [name] is a 77 y.o. White male who is here for CPE.   Last CPE 03/2011. No acute complaints today. He has started getting specialized endovascular laser treatments for his severe LE venous insufficiency--left leg first, then will be getting right leg done.  He is pleased with his care via Dr. Ardyth Gal for this. Says testost has not brought improvement in ED.  He asks if he should still take it.  Asks what should be done next about his ED b/c although viagra works he is sick of paying high price for it.     Past Medical History  Diagnosis Date  . CAD (coronary artery disease)     CABG 1996  . Cardiac arrest 2008    AICD in right ventricle --placed afterward  . Cardiomyopathy, ischemic   . Type II or unspecified type diabetes mellitus without mention of complication, not stated as uncontrolled 03/2011  . Disorder of bone and cartilage, unspecified   . Anemia, unspecified   . OSA on CPAP     sleep study 2008, severe OSA  . Hypercholesterolemia   . GERD (gastroesophageal reflux disease)   . Hx of adenomatous colonic polyps 2003, 2013  . CRI (chronic renal insufficiency)     CrCl @50ml /min    Past Surgical History  Procedure Laterality Date  . Coronary artery bypass graft  1996    3 vessel  . Pacemaker placement  07/2006    St Jude current VRRF  . Colonoscopy w/ polypectomy  2003    Adenomatous; repeat 2006 was recommended but this was not done until 06/10/2011 (by different GI MD)  . Colonoscopy  06/10/11    One diminutive polyp removed (tubular adenoma, no high grade dysplasia).  Repeat colonoscopy 5 yrs (Dr. Christella Hartigan).  . Polypectomy    . Cardiac defibrillator placement      Family History  Problem Relation Age of Onset  . Heart disease Mother   . Stroke Mother   . Heart disease Father   . Colon cancer Neg Hx   . Esophageal cancer Neg Hx   . Stomach cancer Neg  Hx   . Rectal cancer Neg Hx     History   Social History  . Marital Status: Married    Spouse Name: N/A    Number of Children: N/A  . Years of Education: N/A   Occupational History  . Not on file.   Social History Main Topics  . Smoking status: Former Smoker    Types: Cigarettes    Quit date: 02/17/1970  . Smokeless tobacco: Never Used  . Alcohol Use: 4.2 oz/week    7 Glasses of wine per week  . Drug Use: No  . Sexually Active: Not on file   Other Topics Concern  . Not on file   Social History Narrative   Married, 2 daughters, 4 grandchildren.   Retired Arts administrator for [employer] [phone]).   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   Not sedentary, but has no formal exercise regimen.   Ex-smoker, quit in his 60s.  Drinks one glass of wine daily.   No drug use.    Outpatient Prescriptions Prior to Visit  Medication Sig Dispense Refill  . aspirin 81 MG tablet Take 1 tablet (81 mg total) by mouth daily.  30 tablet  6  . atorvastatin (LIPITOR) 20 MG tablet Take 1 tablet (  20 mg total) by mouth daily.  90 tablet  1  . b complex vitamins tablet Take 1 tablet by mouth daily.      . Calcium Carbonate (CALCIUM 500 PO) Take 1 tablet by mouth daily.      . Lancets (FREESTYLE) lancets Use as directed to check blood sugar.  DX:250.0  100 each  5  . lisinopril (PRINIVIL,ZESTRIL) 5 MG tablet Take 1 tablet (5 mg total) by mouth daily.  90 tablet  1  . metoprolol succinate (TOPROL-XL) 50 MG 24 hr tablet TAKE ONE TABLET BY MOUTH ONE TIME DAILY  90 tablet  0  . Multiple Vitamin (MULTIVITAMIN) tablet Take 1 tablet by mouth daily.      . NON FORMULARY OPC 3.  Take 1 tablet by mouth daily.      . NON FORMULARY GLUCOSTATIN. Once daily.      . Omega-3 Fatty Acids (FISH OIL) 1000 MG CPDR Take 1 capsule by mouth daily.      . pantoprazole (PROTONIX) 40 MG tablet Take 1 tablet (40 mg total) by mouth daily.  90 tablet  3  . Testosterone (ANDROGEL PUMP) 20.25 MG/ACT (1.62%) GEL Place 60.75 mg onto  the skin daily. (Distribute 3 pumps onto both shoulders and upper arms once every morning)  75 g  5  . DIGESTIVE ENZYMES PO Take 1 tablet by mouth as needed.       [name] oxyCODONE-acetaminophen (PERCOCET/ROXICET) 5-325 MG per tablet Take 2 tablets by mouth once.  30 tablet  0  . furosemide (LASIX) 20 MG tablet 1 tab po qod as needed for increase in lower extremity swelling  30 tablet  1  . KLOR-CON M20 20 MEQ tablet Take 1 tablet by mouth as needed.       [name] VITAMIN D-VITAMIN K PO Take 1 tablet by mouth daily.      . cephALEXin (KEFLEX) 500 MG capsule Take 1 capsule (500 mg total) by mouth 2 (two) times daily.  10 capsule  0   No facility-administered medications prior to visit.    Allergies  Allergen Reactions  . Naproxen Sodium Nausea Only    ROS Review of Systems  Constitutional: Negative for fever, chills, appetite change and fatigue.  HENT: Negative for ear pain, congestion, sore throat, neck stiffness and dental problem.   Eyes: Negative for discharge, redness and visual disturbance.  Respiratory: Negative for cough, chest tightness, shortness of breath and wheezing.   Cardiovascular: Negative for chest pain, palpitations and leg swelling.  Gastrointestinal: Negative for nausea, vomiting, abdominal pain, diarrhea and blood in stool.  Endocrine: Negative for polydipsia, polyphagia and polyuria.  Genitourinary: Negative for dysuria, urgency, frequency, hematuria, flank pain and difficulty urinating.  Musculoskeletal: Negative for myalgias, back pain, joint swelling and arthralgias.  Skin: Negative for pallor and rash.  Neurological: Negative for dizziness, speech difficulty, weakness and headaches.  Hematological: Negative for adenopathy. Does not bruise/bleed easily.  Psychiatric/Behavioral: Negative for confusion and sleep disturbance. The patient is not nervous/anxious.     PE; Blood pressure 131/82, pulse 65, temperature 98.2 F (36.8 C), temperature source Oral, resp. rate 16,  height 5\' 11"  (1.803 m), weight 231 lb (104.781 kg), SpO2 95.00%. Gen: Alert, well appearing.  Patient is oriented to person, place, time, and situation. AFFECT: pleasant, lucid thought and speech. ENT: Ears: EACs clear, normal epithelium.  TMs with good light reflex and landmarks bilaterally.  Eyes: no injection, icteris, swelling, or exudate.  EOMI, PERRLA. Nose: no drainage or turbinate edema/swelling.  No injection or focal lesion.  Mouth: lips without lesion/swelling.  Oral mucosa pink and moist.  Dentition intact and without obvious caries or gingival swelling.  Oropharynx without erythema, exudate, or swelling.  Neck: supple/nontender.  No LAD, mass, or TM.  Carotid pulses 2+ bilaterally, without bruits. CV: RRR, no m/r/g.   LUNGS: CTA bilat, nonlabored resps, good aeration in all lung fields. ABD: soft, NT, ND, BS normal.  No hepatospenomegaly or mass.  No bruits. EXT: no clubbing, cyanosis, or edema.  Musculoskeletal: no joint swelling, erythema, warmth, or tenderness.  ROM of all joints intact. Skin - no sores or suspicious lesions or rashes or color changes Genitals normal; both testes normal without tenderness, masses, hydroceles, varicoceles, erythema or swelling. Shaft normal, circumcised, meatus normal without discharge. No inguinal hernia noted. No inguinal lymphadenopathy. Rectal exam: negative without mass, lesions or tenderness, PROSTATE EXAM: smooth and symmetric without nodules or tenderness, RECTAL EXAM: negative without mass, lesions or tenderness.  Pertinent labs:  None today  ASSESSMENT AND PLAN:   Health maintenance examination Reviewed age and gender appropriate health maintenance issues (prudent diet, regular exercise, health risks of tobacco and excessive alcohol, use of seatbelts, fire alarms in home, use of sunscreen).  Also reviewed age and gender appropriate health screening as well as vaccine recommendations.  Vaccines are UTD. Plan: d/c testost since he has  gained no benefit from adequate dosing of this med over an adequate treatment duration.  In the future, he does want to see a urologist regarding discussion of other options for ED treatment since viagra et al are so expensive.  Right now, however, he is too busy. Routine fasting labs + HbA1c monitoring today. DRE today normal.  His PSA is already UTD (normal/stable 12/18/11). Regarding DM 2, he will be getting his annual diab retpthy screening exam soon, and his urine microalb is UTD (neg 10/07/11).   An After Visit Summary was printed and given to the patient.  FOLLOW UP:  Return in about 4 months (around 11/06/2012) for f/u DM 2, .

## 2012-07-06 NOTE — Assessment & Plan Note (Addendum)
Reviewed age and gender appropriate health maintenance issues (prudent diet, regular exercise, health risks of tobacco and excessive alcohol, use of seatbelts, fire alarms in home, use of sunscreen).  Also reviewed age and gender appropriate health screening as well as vaccine recommendations.  Vaccines are UTD. Plan: d/c testost since he has gained no benefit from adequate dosing of this med over an adequate treatment duration.  In the future, he does want to see a urologist regarding discussion of other options for ED treatment since viagra et al are so expensive.  Right now, however, he is too busy. Routine fasting labs + HbA1c monitoring today. DRE today normal.  His PSA is already UTD (normal/stable 12/18/11). Colon cancer screening is UTD. Regarding DM 2, he will be getting his annual diab retpthy screening exam soon, and his urine microalb is UTD (neg 10/07/11).

## 2012-07-06 NOTE — Progress Notes (Signed)
Labs only

## 2012-07-15 DIAGNOSIS — M79609 Pain in unspecified limb: Secondary | ICD-10-CM | POA: Diagnosis not present

## 2012-07-15 DIAGNOSIS — I8310 Varicose veins of unspecified lower extremity with inflammation: Secondary | ICD-10-CM | POA: Diagnosis not present

## 2012-07-29 DIAGNOSIS — M79609 Pain in unspecified limb: Secondary | ICD-10-CM | POA: Diagnosis not present

## 2012-07-29 DIAGNOSIS — I8310 Varicose veins of unspecified lower extremity with inflammation: Secondary | ICD-10-CM | POA: Diagnosis not present

## 2012-08-12 DIAGNOSIS — I8310 Varicose veins of unspecified lower extremity with inflammation: Secondary | ICD-10-CM | POA: Diagnosis not present

## 2012-08-12 DIAGNOSIS — M79609 Pain in unspecified limb: Secondary | ICD-10-CM | POA: Diagnosis not present

## 2012-08-23 ENCOUNTER — Encounter: Payer: Self-pay | Admitting: Internal Medicine

## 2012-08-23 ENCOUNTER — Ambulatory Visit (INDEPENDENT_AMBULATORY_CARE_PROVIDER_SITE_OTHER): Payer: Medicare Other | Admitting: *Deleted

## 2012-08-23 DIAGNOSIS — Z95810 Presence of automatic (implantable) cardiac defibrillator: Secondary | ICD-10-CM | POA: Diagnosis not present

## 2012-08-23 DIAGNOSIS — I469 Cardiac arrest, cause unspecified: Secondary | ICD-10-CM | POA: Diagnosis not present

## 2012-08-23 LAB — REMOTE ICD DEVICE
BATTERY VOLTAGE: 2.6 V
BRDY-0002RV: 40 {beats}/min
DEV-0020ICD: NEGATIVE
DEVICE MODEL ICD: 449388
HV IMPEDENCE: 51 Ohm
RV LEAD AMPLITUDE: 10.7 mv
RV LEAD IMPEDENCE ICD: 580 Ohm
TZAT-0001FASTVT: 1
TZAT-0001SLOWVT: 1
TZAT-0004FASTVT: 8
TZAT-0004SLOWVT: 8
TZAT-0012FASTVT: 200 ms
TZAT-0012SLOWVT: 200 ms
TZAT-0013FASTVT: 1
TZAT-0013SLOWVT: 4
TZAT-0018FASTVT: NEGATIVE
TZAT-0018SLOWVT: NEGATIVE
TZAT-0019FASTVT: 7.5 V
TZAT-0019SLOWVT: 7.5 V
TZAT-0020FASTVT: 1 ms
TZAT-0020SLOWVT: 1 ms
TZON-0003FASTVT: 300 ms
TZON-0003SLOWVT: 340 ms
TZON-0004FASTVT: 12
TZON-0004SLOWVT: 24
TZON-0005FASTVT: 6
TZON-0005SLOWVT: 6
TZON-0010FASTVT: 80 ms
TZON-0010SLOWVT: 80 ms
TZST-0001FASTVT: 2
TZST-0001FASTVT: 3
TZST-0001FASTVT: 4
TZST-0001FASTVT: 5
TZST-0001SLOWVT: 2
TZST-0001SLOWVT: 3
TZST-0001SLOWVT: 4
TZST-0001SLOWVT: 5
TZST-0003FASTVT: 36 J
TZST-0003FASTVT: 36 J
TZST-0003FASTVT: 36 J
TZST-0003FASTVT: 36 J
TZST-0003SLOWVT: 25 J
TZST-0003SLOWVT: 36 J
TZST-0003SLOWVT: 36 J
TZST-0003SLOWVT: 36 J
VENTRICULAR PACING ICD: 1 pct

## 2012-08-26 DIAGNOSIS — M79609 Pain in unspecified limb: Secondary | ICD-10-CM | POA: Diagnosis not present

## 2012-08-26 DIAGNOSIS — I8310 Varicose veins of unspecified lower extremity with inflammation: Secondary | ICD-10-CM | POA: Diagnosis not present

## 2012-09-04 ENCOUNTER — Other Ambulatory Visit: Payer: Self-pay | Admitting: Family Medicine

## 2012-09-06 DIAGNOSIS — I8310 Varicose veins of unspecified lower extremity with inflammation: Secondary | ICD-10-CM | POA: Diagnosis not present

## 2012-09-10 DIAGNOSIS — I8310 Varicose veins of unspecified lower extremity with inflammation: Secondary | ICD-10-CM | POA: Diagnosis not present

## 2012-09-10 DIAGNOSIS — M79609 Pain in unspecified limb: Secondary | ICD-10-CM | POA: Diagnosis not present

## 2012-09-14 ENCOUNTER — Encounter: Payer: Self-pay | Admitting: *Deleted

## 2012-09-28 DIAGNOSIS — I8310 Varicose veins of unspecified lower extremity with inflammation: Secondary | ICD-10-CM | POA: Diagnosis not present

## 2012-09-28 DIAGNOSIS — M79609 Pain in unspecified limb: Secondary | ICD-10-CM | POA: Diagnosis not present

## 2012-09-28 DIAGNOSIS — M7981 Nontraumatic hematoma of soft tissue: Secondary | ICD-10-CM | POA: Diagnosis not present

## 2012-10-02 ENCOUNTER — Other Ambulatory Visit: Payer: Self-pay | Admitting: Family Medicine

## 2012-10-21 DIAGNOSIS — M79609 Pain in unspecified limb: Secondary | ICD-10-CM | POA: Diagnosis not present

## 2012-10-21 DIAGNOSIS — M7981 Nontraumatic hematoma of soft tissue: Secondary | ICD-10-CM | POA: Diagnosis not present

## 2012-10-21 DIAGNOSIS — I8310 Varicose veins of unspecified lower extremity with inflammation: Secondary | ICD-10-CM | POA: Diagnosis not present

## 2012-11-04 DIAGNOSIS — M7981 Nontraumatic hematoma of soft tissue: Secondary | ICD-10-CM | POA: Diagnosis not present

## 2012-11-04 DIAGNOSIS — M79609 Pain in unspecified limb: Secondary | ICD-10-CM | POA: Diagnosis not present

## 2012-11-04 DIAGNOSIS — I8310 Varicose veins of unspecified lower extremity with inflammation: Secondary | ICD-10-CM | POA: Diagnosis not present

## 2012-11-09 ENCOUNTER — Ambulatory Visit (INDEPENDENT_AMBULATORY_CARE_PROVIDER_SITE_OTHER): Payer: Medicare Other | Admitting: Family Medicine

## 2012-11-09 ENCOUNTER — Encounter: Payer: Self-pay | Admitting: Family Medicine

## 2012-11-09 VITALS — BP 126/75 | HR 68 | Temp 97.8°F | Resp 18 | Ht 71.0 in | Wt 234.0 lb

## 2012-11-09 DIAGNOSIS — N189 Chronic kidney disease, unspecified: Secondary | ICD-10-CM | POA: Diagnosis not present

## 2012-11-09 DIAGNOSIS — N183 Chronic kidney disease, stage 3 unspecified: Secondary | ICD-10-CM | POA: Diagnosis not present

## 2012-11-09 DIAGNOSIS — E119 Type 2 diabetes mellitus without complications: Secondary | ICD-10-CM | POA: Diagnosis not present

## 2012-11-09 LAB — BASIC METABOLIC PANEL
BUN: 19 mg/dL (ref 6–23)
CO2: 27 mEq/L (ref 19–32)
Calcium: 9.2 mg/dL (ref 8.4–10.5)
Chloride: 108 mEq/L (ref 96–112)
Creatinine, Ser: 1.3 mg/dL (ref 0.4–1.5)
GFR: 56.23 mL/min — ABNORMAL LOW (ref >60.00)
Glucose, Bld: 114 mg/dL — ABNORMAL HIGH (ref 70–99)
Potassium: 4.4 mEq/L (ref 3.5–5.1)
Sodium: 140 mEq/L (ref 135–145)

## 2012-11-09 LAB — HEMOGLOBIN A1C: Hgb A1c MFr Bld: 6.8 % — ABNORMAL HIGH (ref 4.6–6.5)

## 2012-11-09 MED ORDER — METOPROLOL TARTRATE 25 MG PO TABS: 25.0000 mg | ORAL_TABLET | Freq: Two times a day (BID) | ORAL | Status: DC

## 2012-11-09 NOTE — Assessment & Plan Note (Signed)
-   BMET today 

## 2012-11-09 NOTE — Progress Notes (Signed)
OFFICE NOTE  11/09/2012  CC:  Chief Complaint  Patient presents with  . Diabetes  . Hypogonadism     HPI: Patient is a 77 y.o. Caucasian male who is here for 4 mo f/u CRI, DM 2. Feeling great--finishing up his last rx of testosterone then he'll stop it b/c it didn't help. Complains today about not getting more than a 3 mo supply (with no RFs) of his chronic meds. Also says toprol xl too expensive, wants to take toprol 25mg  bid (non XL)--currently he is taking his brothers that his brother gets free from the [place].  I said this was fine. He says glucoses 108 avg fasting and he rarely checks it these days b/c "it's always the same". Dietary compliance fluctuates.  Pertinent PMH:  Past Medical History  Diagnosis Date  . CAD (coronary artery disease)     CABG 1996  . Cardiac arrest 2008    AICD in right ventricle --placed afterward  . Cardiomyopathy, ischemic   . Type II or unspecified type diabetes mellitus without mention of complication, not stated as uncontrolled 03/2011  . Disorder of bone and cartilage, unspecified   . Anemia, unspecified   . OSA on CPAP     sleep study 2008, severe OSA  . Hypercholesterolemia   . GERD (gastroesophageal reflux disease)   . Hx of adenomatous colonic polyps 2003, 2013  . CRI (chronic renal insufficiency)     CrCl @50ml /min    MEDS:  Outpatient Prescriptions Prior to Visit  Medication Sig Dispense Refill  . aspirin 81 MG tablet Take 1 tablet (81 mg total) by mouth daily.  30 tablet  6  . atorvastatin (LIPITOR) 20 MG tablet Take 1 tablet (20 mg total) by mouth daily.  90 tablet  1  . b complex vitamins tablet Take 1 tablet by mouth daily.      . Calcium Carbonate (CALCIUM 500 PO) Take 1 tablet by mouth daily.      . furosemide (LASIX) 20 MG tablet 1 tab po qod as needed for increase in lower extremity swelling  30 tablet  1  . KLOR-CON M20 20 MEQ tablet Take 1 tablet by mouth as needed.       [name] lisinopril (PRINIVIL,ZESTRIL) 5 MG tablet Take  1 tablet (5 mg total) by mouth daily.  90 tablet  1  . metoprolol succinate (TOPROL-XL) 50 MG 24 hr tablet TAKE ONE TABLET BY MOUTH ONE TIME DAILY  90 tablet  0  . Multiple Vitamin (MULTIVITAMIN) tablet Take 1 tablet by mouth daily.      . NON FORMULARY OPC 3.  Take 1 tablet by mouth daily.      . NON FORMULARY GLUCOSTATIN. Once daily.      . Omega-3 Fatty Acids (FISH OIL) 1000 MG CPDR Take 1 capsule by mouth daily.      . pantoprazole (PROTONIX) 40 MG tablet Take 1 tablet (40 mg total) by mouth daily.  90 tablet  1  . Testosterone (ANDROGEL PUMP) 20.25 MG/ACT (1.62%) GEL Place 60.75 mg onto the skin daily. (Distribute 3 pumps onto both shoulders and upper arms once every morning)  75 g  5  . VITAMIN D-VITAMIN K PO Take 1 tablet by mouth daily.      . Lancets (FREESTYLE) lancets Use as directed to check blood sugar.  DX:250.0  100 each  5   No facility-administered medications prior to visit.    PE: Blood pressure 126/75, pulse 68, temperature 97.8 F (36.6 C),  temperature source Temporal, resp. rate 18, height 5\' 11"  (1.803 m), weight 234 lb (106.142 kg), SpO2 96.00%. Gen: Alert, well appearing.  Patient is oriented to person, place, time, and situation. AFFECT: pleasant, lucid thought and speech. Foot exam - bilateral normal; no swelling, tenderness or skin or vascular lesions. Color and temperature is normal. Sensation is intact. Peripheral pulses are palpable. Toenails are thickened somewhat, otherwise normal.   IMPRESSION AND PLAN:  Type II or unspecified type diabetes mellitus without mention of complication, not stated as uncontrolled Stable. Check HbA1c today. Foot exam normal today.  CRI (chronic renal insufficiency) BMET today.   An After Visit Summary was printed and given to the patient.  FOLLOW UP:  39mo

## 2012-11-09 NOTE — Assessment & Plan Note (Signed)
Stable. Check HbA1c today. Foot exam normal today.

## 2012-11-11 ENCOUNTER — Other Ambulatory Visit: Payer: Medicare Other

## 2012-11-11 DIAGNOSIS — N189 Chronic kidney disease, unspecified: Secondary | ICD-10-CM

## 2012-11-11 DIAGNOSIS — E119 Type 2 diabetes mellitus without complications: Secondary | ICD-10-CM | POA: Diagnosis not present

## 2012-11-11 LAB — MICROALBUMIN / CREATININE URINE RATIO
Creatinine,U: 116.5 mg/dL
Microalb Creat Ratio: 1.2 mg/g (ref 0.0–30.0)
Microalb, Ur: 1.4 mg/dL (ref 0.0–1.9)

## 2012-11-11 NOTE — Progress Notes (Signed)
Labs only

## 2012-11-22 DIAGNOSIS — M79609 Pain in unspecified limb: Secondary | ICD-10-CM | POA: Diagnosis not present

## 2012-11-22 DIAGNOSIS — I8310 Varicose veins of unspecified lower extremity with inflammation: Secondary | ICD-10-CM | POA: Diagnosis not present

## 2012-11-29 ENCOUNTER — Ambulatory Visit (INDEPENDENT_AMBULATORY_CARE_PROVIDER_SITE_OTHER): Payer: Medicare Other | Admitting: *Deleted

## 2012-11-29 DIAGNOSIS — I469 Cardiac arrest, cause unspecified: Secondary | ICD-10-CM | POA: Diagnosis not present

## 2012-11-29 DIAGNOSIS — Z95810 Presence of automatic (implantable) cardiac defibrillator: Secondary | ICD-10-CM

## 2012-12-01 LAB — REMOTE ICD DEVICE
BATTERY VOLTAGE: 2.59 V
BRDY-0002RV: 40 {beats}/min
DEV-0020ICD: NEGATIVE
DEVICE MODEL ICD: 449388
HV IMPEDENCE: 49 Ohm
RV LEAD AMPLITUDE: 11 mv
RV LEAD IMPEDENCE ICD: 630 Ohm
TZAT-0001FASTVT: 1
TZAT-0001SLOWVT: 1
TZAT-0004FASTVT: 8
TZAT-0004SLOWVT: 8
TZAT-0012FASTVT: 200 ms
TZAT-0012SLOWVT: 200 ms
TZAT-0013FASTVT: 1
TZAT-0013SLOWVT: 4
TZAT-0018FASTVT: NEGATIVE
TZAT-0018SLOWVT: NEGATIVE
TZAT-0019FASTVT: 7.5 V
TZAT-0019SLOWVT: 7.5 V
TZAT-0020FASTVT: 1 ms
TZAT-0020SLOWVT: 1 ms
TZON-0003FASTVT: 300 ms
TZON-0003SLOWVT: 340 ms
TZON-0004FASTVT: 12
TZON-0004SLOWVT: 24
TZON-0005FASTVT: 6
TZON-0005SLOWVT: 6
TZON-0010FASTVT: 80 ms
TZON-0010SLOWVT: 80 ms
TZST-0001FASTVT: 2
TZST-0001FASTVT: 3
TZST-0001FASTVT: 4
TZST-0001FASTVT: 5
TZST-0001SLOWVT: 2
TZST-0001SLOWVT: 3
TZST-0001SLOWVT: 4
TZST-0001SLOWVT: 5
TZST-0003FASTVT: 36 J
TZST-0003FASTVT: 36 J
TZST-0003FASTVT: 36 J
TZST-0003FASTVT: 36 J
TZST-0003SLOWVT: 25 J
TZST-0003SLOWVT: 36 J
TZST-0003SLOWVT: 36 J
TZST-0003SLOWVT: 36 J
VENTRICULAR PACING ICD: 1 pct

## 2012-12-08 ENCOUNTER — Encounter: Payer: Self-pay | Admitting: Internal Medicine

## 2012-12-14 DIAGNOSIS — M79609 Pain in unspecified limb: Secondary | ICD-10-CM | POA: Diagnosis not present

## 2012-12-14 DIAGNOSIS — M7981 Nontraumatic hematoma of soft tissue: Secondary | ICD-10-CM | POA: Diagnosis not present

## 2012-12-14 DIAGNOSIS — I8310 Varicose veins of unspecified lower extremity with inflammation: Secondary | ICD-10-CM | POA: Diagnosis not present

## 2012-12-24 ENCOUNTER — Encounter: Payer: Self-pay | Admitting: Internal Medicine

## 2012-12-30 DIAGNOSIS — I8310 Varicose veins of unspecified lower extremity with inflammation: Secondary | ICD-10-CM | POA: Diagnosis not present

## 2012-12-30 DIAGNOSIS — M79609 Pain in unspecified limb: Secondary | ICD-10-CM | POA: Diagnosis not present

## 2013-01-03 ENCOUNTER — Other Ambulatory Visit: Payer: Self-pay | Admitting: Family Medicine

## 2013-01-05 DIAGNOSIS — S61209A Unspecified open wound of unspecified finger without damage to nail, initial encounter: Secondary | ICD-10-CM | POA: Diagnosis not present

## 2013-01-05 DIAGNOSIS — M19049 Primary osteoarthritis, unspecified hand: Secondary | ICD-10-CM | POA: Diagnosis not present

## 2013-01-05 DIAGNOSIS — Z9889 Other specified postprocedural states: Secondary | ICD-10-CM | POA: Diagnosis not present

## 2013-03-01 ENCOUNTER — Encounter: Payer: Medicare Other | Admitting: *Deleted

## 2013-03-01 DIAGNOSIS — I469 Cardiac arrest, cause unspecified: Secondary | ICD-10-CM

## 2013-03-01 LAB — MDC_IDC_ENUM_SESS_TYPE_REMOTE
Battery Remaining Longevity: 52 mo
Battery Voltage: 2.57 V
Brady Statistic RV Percent Paced: 1 %
Date Time Interrogation Session: 20150113075800
HighPow Impedance: 51 Ohm
Implantable Pulse Generator Serial Number: 449388
Lead Channel Impedance Value: 550 Ohm
Lead Channel Pacing Threshold Amplitude: 1 V
Lead Channel Pacing Threshold Pulse Width: 0.4 ms
Lead Channel Sensing Intrinsic Amplitude: 11.2 mV
Lead Channel Setting Pacing Amplitude: 2.5 V
Lead Channel Setting Pacing Pulse Width: 0.4 ms
Lead Channel Setting Sensing Sensitivity: 0.3 mV
Zone Setting Detection Interval: 250 ms
Zone Setting Detection Interval: 300 ms
Zone Setting Detection Interval: 340 ms

## 2013-03-14 ENCOUNTER — Ambulatory Visit (INDEPENDENT_AMBULATORY_CARE_PROVIDER_SITE_OTHER): Payer: Medicare Other | Admitting: Family Medicine

## 2013-03-14 ENCOUNTER — Encounter: Payer: Self-pay | Admitting: Family Medicine

## 2013-03-14 VITALS — BP 131/77 | HR 73 | Temp 98.0°F | Resp 18 | Ht 71.0 in | Wt 237.0 lb

## 2013-03-14 DIAGNOSIS — E291 Testicular hypofunction: Secondary | ICD-10-CM | POA: Diagnosis not present

## 2013-03-14 DIAGNOSIS — Z23 Encounter for immunization: Secondary | ICD-10-CM

## 2013-03-14 DIAGNOSIS — M2010 Hallux valgus (acquired), unspecified foot: Secondary | ICD-10-CM

## 2013-03-14 DIAGNOSIS — N529 Male erectile dysfunction, unspecified: Secondary | ICD-10-CM

## 2013-03-14 DIAGNOSIS — M2012 Hallux valgus (acquired), left foot: Secondary | ICD-10-CM

## 2013-03-14 DIAGNOSIS — M2011 Hallux valgus (acquired), right foot: Secondary | ICD-10-CM

## 2013-03-14 NOTE — Progress Notes (Signed)
Pre visit review using our clinic review tool, if applicable. No additional management support is needed unless otherwise documented below in the visit note. 

## 2013-03-14 NOTE — Progress Notes (Signed)
OFFICE NOTE  03/14/2013  CC:  Chief Complaint  Patient presents with  . Follow-up    4 months  . foot pain    once in awhile.      HPI: [name] who is here for 4 mo f/u DM 2, hyperlipidemia, CRI stage III. Has hammertoes that give him some pain intermittently, wonders about podiatry referral.  Says testosterone has not helped with ED.  He has stopped it and is interested in seeing what a urologist has to offer.  He refuses to pay the huge price tag for drugs like viagra.   He is active, working quite a bit. No glucose checks since I last saw him. No home bp checks.  Denies any recent vision changes.  No burning, tingling, or numbness in feet.  His lower legs feel better since getting a lot of varicose vein treatments.  Pertinent PMH:  Past Medical History  Diagnosis Date  . CAD (coronary artery disease)     CABG 1996  . Cardiac arrest 2008    AICD in right ventricle --placed afterward  . Cardiomyopathy, ischemic   . Type II or unspecified type diabetes mellitus without mention of complication, not stated as uncontrolled 03/2011  . Disorder of bone and cartilage, unspecified   . Anemia, unspecified   . OSA on CPAP     sleep study 2008, severe OSA  . Hypercholesterolemia   . GERD (gastroesophageal reflux disease)   . Hx of adenomatous colonic polyps 2003, 2013  . CRI (chronic renal insufficiency)     CrCl @50ml /min    MEDS:  Outpatient Prescriptions Prior to Visit  Medication Sig Dispense Refill  . aspirin 81 MG tablet Take 1 tablet (81 mg total) by mouth daily.  30 tablet  6  . atorvastatin (LIPITOR) 20 MG tablet Take one tablet by mouth one time daily  90 tablet  0  . b complex vitamins tablet Take 1 tablet by mouth daily.      . Calcium Carbonate (CALCIUM 500 PO) Take 1 tablet by mouth daily.      . furosemide (LASIX) 20 MG tablet 1 tab po qod as needed for increase in lower extremity swelling  30 tablet  1  . KLOR-CON M20 20 MEQ tablet  Take 1 tablet by mouth as needed.       [name] lisinopril (PRINIVIL,ZESTRIL) 5 MG tablet Take 1 tablet (5 mg total) by mouth daily.  90 tablet  1  . metoprolol tartrate (LOPRESSOR) 25 MG tablet Take 1 tablet (25 mg total) by mouth 2 (two) times daily.  180 tablet  3  . Multiple Vitamin (MULTIVITAMIN) tablet Take 1 tablet by mouth daily.      . NON FORMULARY OPC 3.  Take 1 tablet by mouth daily.      . NON FORMULARY GLUCOSTATIN. Once daily.      . Omega-3 Fatty Acids (FISH OIL) 1000 MG CPDR Take 1 capsule by mouth daily.      . pantoprazole (PROTONIX) 40 MG tablet Take 1 tablet (40 mg total) by mouth daily.  90 tablet  1  . Testosterone (ANDROGEL PUMP) 20.25 MG/ACT (1.62%) GEL Place 60.75 mg onto the skin daily. (Distribute 3 pumps onto both shoulders and upper arms once every morning)  75 g  5  . VITAMIN D-VITAMIN K PO Take 1 tablet by mouth daily.      . Lancets (FREESTYLE) lancets Use as directed to check blood sugar.  DX:250.0  100 each  5   No facility-administered medications prior to visit.    PE: Blood pressure 131/77, pulse 73, temperature 98 F (36.7 C), temperature source Temporal, resp. rate 18, height 5\' 11"  (1.803 m), weight 237 lb (107.502 kg), SpO2 93.00%. Gen: Alert, well appearing.  Patient is oriented to person, place, time, and situation. Lower legs: trace pitting edema bilat, diffuse skin fibrotic changes c/w chronic stasis dermatitis.  No erythema. Feet: hallux valgus deformity bilat, R>L.  ROM of feet/ankles/toes intact.  LABS: none today Lab Results  Component Value Date   HGBA1C 6.8* 11/09/2012     Chemistry      Component Value Date/Time   NA 140 11/09/2012 0830   K 4.4 11/09/2012 0830   CL 108 11/09/2012 0830   CO2 27 11/09/2012 0830   BUN 19 11/09/2012 0830   CREATININE 1.3 11/09/2012 0830      Component Value Date/Time   CALCIUM 9.2 11/09/2012 0830   ALKPHOS 43 03/18/2012 0823   AST 30 03/18/2012 0823   ALT 32 03/18/2012 0823   BILITOT 0.9 03/18/2012 0823      Lab Results  Component Value Date   CHOL 101 07/06/2012   HDL 31.40* 07/06/2012   LDLCALC 58 07/06/2012   TRIG 58.0 07/06/2012   CHOLHDL 3 07/06/2012   Lab Results  Component Value Date   WBC 6.8 07/06/2012   HGB 14.5 07/06/2012   HCT 44.0 07/06/2012   MCV 90.6 07/06/2012   PLT 179.0 07/06/2012    IMPRESSION AND PLAN:  1) DM 2, diet controlled. Historically well controlled.  He knows he needs to set up screening diabetic eye exam. Continue diet only at this time, recheck HbA1c at next f/u in 3mo.  2) Hyperlipidemia: tolerating statin well.  Recheck labs in 25mo.  3) HTN: The current medical regimen is effective;  continue present plan and medications. Check lytes/cr next f/u in 4 mo.  4) CRI, stage 3. Recheck BMET at next f/u in 4 mo.  5) ED, hx of hypogonadism.  No response to topical testost replacement. Patient is open to urologic referral for discussion of options other than ED meds or testosterone.  6) Hallux valgus deformity bilat, intermittent foot pain. Podiatry referral ordered.  7) Preventative health: prevnar 13 IM given today.  An After Visit Summary was printed and given to the patient.  FOLLOW UP: 47mo

## 2013-03-23 ENCOUNTER — Encounter: Payer: Self-pay | Admitting: *Deleted

## 2013-03-31 ENCOUNTER — Encounter: Payer: Self-pay | Admitting: Internal Medicine

## 2013-04-05 ENCOUNTER — Other Ambulatory Visit: Payer: Self-pay | Admitting: Family Medicine

## 2013-04-18 DIAGNOSIS — N401 Enlarged prostate with lower urinary tract symptoms: Secondary | ICD-10-CM | POA: Diagnosis not present

## 2013-04-18 DIAGNOSIS — E291 Testicular hypofunction: Secondary | ICD-10-CM | POA: Diagnosis not present

## 2013-04-18 DIAGNOSIS — N139 Obstructive and reflux uropathy, unspecified: Secondary | ICD-10-CM | POA: Diagnosis not present

## 2013-04-20 ENCOUNTER — Encounter: Payer: Self-pay | Admitting: Family Medicine

## 2013-04-29 ENCOUNTER — Ambulatory Visit (INDEPENDENT_AMBULATORY_CARE_PROVIDER_SITE_OTHER): Payer: Medicare Other

## 2013-04-29 VITALS — BP 123/61 | HR 58 | Resp 16 | Ht 72.0 in | Wt 235.0 lb

## 2013-04-29 DIAGNOSIS — M79673 Pain in unspecified foot: Secondary | ICD-10-CM

## 2013-04-29 DIAGNOSIS — M2010 Hallux valgus (acquired), unspecified foot: Secondary | ICD-10-CM

## 2013-04-29 DIAGNOSIS — M79609 Pain in unspecified limb: Secondary | ICD-10-CM

## 2013-04-29 DIAGNOSIS — M2040 Other hammer toe(s) (acquired), unspecified foot: Secondary | ICD-10-CM

## 2013-04-29 DIAGNOSIS — B351 Tinea unguium: Secondary | ICD-10-CM

## 2013-04-29 MED ORDER — TAVABOROLE 5 % EX SOLN: CUTANEOUS | Status: DC

## 2013-04-29 NOTE — Patient Instructions (Signed)
Onychomycosis/Fungal Toenails  WHAT IS IT? An infection that lies within the keratin of your nail plate that is caused by a fungus.  WHY ME? Fungal infections affect all ages, sexes, races, and creeds.  There may be many factors that predispose you to a fungal infection such as age, coexisting medical conditions such as diabetes, or an autoimmune disease; stress, medications, fatigue, genetics, etc.  Bottom line: fungus thrives in a warm, moist environment and your shoes offer such a location.  IS IT CONTAGIOUS? Theoretically, yes.  You do not want to share shoes, nail clippers or files with someone who has fungal toenails.  Walking around barefoot in the same room or sleeping in the same bed is unlikely to transfer the organism.  It is important to realize, however, that fungus can spread easily from one nail to the next on the same foot.  HOW DO WE TREAT THIS?  There are several ways to treat this condition.  Treatment may depend on many factors such as age, medications, pregnancy, liver and kidney conditions, etc.  It is best to ask your doctor which options are available to you.  1. No treatment.   Unlike many other medical concerns, you can live with this condition.  However for many people this can be a painful condition and may lead to ingrown toenails or a bacterial infection.  It is recommended that you keep the nails cut short to help reduce the amount of fungal nail. 2. Topical treatment.  These range from herbal remedies to prescription strength nail lacquers.  About 40-50% effective, topicals require twice daily application for approximately 9 to 12 months or until an entirely new nail has grown out.  The most effective topicals are medical grade medications available through physicians offices. 3. Oral antifungal medications.  With an 80-90% cure rate, the most common oral medication requires 3 to 4 months of therapy and stays in your system for a year as the new nail grows out.  Oral  antifungal medications do require blood work to make sure it is a safe drug for you.  A liver function panel will be performed prior to starting the medication and after the first month of treatment.  It is important to have the blood work performed to avoid any harmful side effects.  In general, this medication safe but blood work is required. 4. Laser Therapy.  This treatment is performed by applying a specialized laser to the affected nail plate.  This therapy is noninvasive, fast, and non-painful.  It is not covered by insurance and is therefore, out of pocket.  The results have been very good with a 80-95% cure rate.  [facility] is the only practice in the area to offer this therapy. 5. Permanent Nail Avulsion.  Removing the entire nail so that a new nail will not grow back.  Apply topical antifungal Kerydin once daily to be to the affected nails as instructed. Apply daily for 12 months duration

## 2013-04-29 NOTE — Progress Notes (Signed)
Subjective:    Patient ID: [name], male    DOB: April 28, 1935, 78 y.o.   MRN: [id number]  HPI Comments: "My feet seems to have gone in all different directions"  Patient c/o painful 1st MPJ and 2nd toes bilateral for several years. The 1st MPJ areas are red and sometimes they swell. The 2nd toes are curling. He gets some numbness in the 3rd and 4th toes occasional. He states that last year his feet were really painful, but has gotten more tolerable since trying to wear wider, softer shoes.  Also, c/o thick, discolored toenails for several years. He states that the tips dig into his skin and become painful when walking. Has tried OTC products and clips down.   Foot Pain  Toe Pain       Review of Systems  Constitutional: Negative.   Respiratory: Negative.   Cardiovascular: Negative.   Gastrointestinal: Negative.   Musculoskeletal: Negative.   Skin:       Change in nails  Neurological: Negative.   Hematological: Negative.   Psychiatric/Behavioral: Negative.   All other systems reviewed and are negative.       Objective:   Physical Exam Okay objective findings as follows vascular status is intact with pedal pulses palpable DP postal for bilateral PT +2/4 bilateral capillary refill time 3 seconds all digits patient is a history varicosities has had some pain surgeries in the recent past with no other complications does wear compression stockings. Epicritic sensations intact and symmetric bilateral there is normal plantar response and DTRs. Dermatologically skin color pigment normal hair growth diminished absent there is hyperpigmentation the ankle areas for venous stasis in the past. Patient does have thick brittle friable yellow discolored nails hallux and second digits no significant the other lesser digits not as severe. Patient is incision topical treatments he bottle of over-the-counter medications however hasn't used any however is interested in a prescription treatments  that are available today. Understands that a candidate for oral medication due to his other vacation lists however he is a candidate for knee topical and prescription for [place] [facility] is called in for him at this time. We'll initiate daily treatment for 12 months  Orthopedic biomechanical exam is remarkable for significant bunion deformity lateral deviation of the hallux and semirigid digital contracture the second toe with hammertoe deformities second bilateral. Left slightly worse than right IM angles 15-18 are noted on x-ray views there is rigid contractures second digits adductovarus rotation is lesser digits. Patient shoes a little too short he's actually hitting the ends of his toes and into the shoes causing some irritation the toe he will look into getting larger shoes recently did change his work boots.       Assessment & Plan:  Assessment this time patient does have onychomycosis affected nails and will initiate topical antifungal treatment at this time as recommended.  Assessment #2 is hallux abductovalgus deformity and hammertoe deformities second bilateral patient is a candidate for surgical intervention will get clearance from his cardiologist reschedule to see within the next month. His cardiologist is a power and will get clearance for outpatient surgery under IV sedation and local block. He's likely candidate for [facility] bunionectomy and hammertoe repair second with pin fixation. Patient understands we'll be we'll do 1 foot at a time in a 3 month interval. We'll reappoint with the next month or 2 for possible surgical consultation was his discuss this option with his cardiologist. Will initiate topical antifungal treatment were to arise reported from  the pharmacy. Consult with in one to 2 months next  Harriet Masson DPM

## 2013-05-09 ENCOUNTER — Telehealth: Payer: Self-pay | Admitting: *Deleted

## 2013-05-09 NOTE — Telephone Encounter (Signed)
I called [facility] for the foot fungus medicine.  The medicine is on back order for 2 weeks, just thought you ought to know.  I guess I'll have to wait for it.  I returned his call patient's wife said he has received the medicine.  She stated he's good.

## 2013-05-23 ENCOUNTER — Ambulatory Visit (INDEPENDENT_AMBULATORY_CARE_PROVIDER_SITE_OTHER): Payer: Medicare Other | Admitting: Family Medicine

## 2013-05-23 ENCOUNTER — Encounter: Payer: Self-pay | Admitting: Family Medicine

## 2013-05-23 VITALS — BP 129/81 | HR 65 | Temp 98.4°F | Resp 18 | Ht 71.0 in | Wt 233.0 lb

## 2013-05-23 DIAGNOSIS — J209 Acute bronchitis, unspecified: Secondary | ICD-10-CM | POA: Diagnosis not present

## 2013-05-23 DIAGNOSIS — J069 Acute upper respiratory infection, unspecified: Secondary | ICD-10-CM

## 2013-05-23 MED ORDER — METHYLPREDNISOLONE ACETATE 40 MG/ML IJ SUSP
40.0000 mg | Freq: Once | INTRAMUSCULAR | Status: AC
  Administered 2013-05-23: 40 mg via INTRA_ARTICULAR

## 2013-05-23 NOTE — Progress Notes (Signed)
OFFICE NOTE  05/23/2013  CC:  Chief Complaint  Patient presents with  . Cough    x last Thursday     HPI: [name] who is here for cough.   Last week-5 days ago-- was around lots of dust/fiberglass particles at a worksite, then onset of nasal congestion, mucous drainage, subjective fever x 1d 2 days ago.  Rattly breathing.  Doesn't feel SOB.  Pertinent PMH:  Past medical, surgical, social, and family history reviewed and no changes are noted since last office visit.  MEDS:  Outpatient Prescriptions Prior to Visit  Medication Sig Dispense Refill  . aspirin 81 MG tablet Take 1 tablet (81 mg total) by mouth daily.  30 tablet  6  . atorvastatin (LIPITOR) 20 MG tablet TAKE ONE TABLET BY MOUTH ONE TIME DAILY   90 tablet  1  . b complex vitamins tablet Take 1 tablet by mouth daily.      . Calcium Carbonate (CALCIUM 500 PO) Take 1 tablet by mouth daily.      . furosemide (LASIX) 20 MG tablet 1 tab po qod as needed for increase in lower extremity swelling  30 tablet  1  . KLOR-CON M20 20 MEQ tablet Take 1 tablet by mouth as needed.       [name] lisinopril (PRINIVIL,ZESTRIL) 5 MG tablet TAKE ONE TABLET BY MOUTH ONE TIME DAILY   90 tablet  0  . metoprolol tartrate (LOPRESSOR) 25 MG tablet Take 1 tablet (25 mg total) by mouth 2 (two) times daily.  180 tablet  3  . Multiple Vitamin (MULTIVITAMIN) tablet Take 1 tablet by mouth daily.      . NON FORMULARY OPC 3.  Take 1 tablet by mouth daily.      . NON FORMULARY GLUCOSTATIN. Once daily.      . Omega-3 Fatty Acids (FISH OIL) 1000 MG CPDR Take 1 capsule by mouth daily.      . pantoprazole (PROTONIX) 40 MG tablet TAKE ONE TABLET BY MOUTH ONE TIME DAILY   90 tablet  0  . Tavaborole (KERYDIN) 5 % SOLN Apply 1 drop to each affected nail once daily for 12 months  10 mL  11  . Testosterone (ANDROGEL PUMP) 20.25 MG/ACT (1.62%) GEL Place 60.75 mg onto the skin daily. (Distribute 3 pumps onto both shoulders and upper arms once every  morning)  75 g  5  . VITAMIN D-VITAMIN K PO Take 1 tablet by mouth daily.      . Lancets (FREESTYLE) lancets Use as directed to check blood sugar.  DX:250.0  100 each  5   No facility-administered medications prior to visit.    PE: Blood pressure 129/81, pulse 65, temperature 98.4 F (36.9 C), temperature source Temporal, resp. rate 18, height 5\' 11"  (1.803 m), weight 233 lb (105.688 kg), SpO2 96.00%. VS: noted--normal. Gen: alert, NAD, NONTOXIC APPEARING. HEENT: eyes without injection, drainage, or swelling.  Ears: EACs clear, TMs with normal light reflex and landmarks.  Nose: trace clear rhinorrhea, with some dried, crusty exudate adherent to mildly injected mucosa.  No purulent d/c.  No paranasal sinus TTP.  No facial swelling.  Throat and mouth without focal lesion.  No pharyngial swelling, erythema, or exudate.   Neck: supple, no LAD.   LUNGS: CTA bilat, nonlabored resps.   CV: RRR, no m/r/g. EXT: no c/c/e SKIN: no rash  IMPRESSION AND PLAN:  URI with mild acute asthmatic bronchitis. SAline nasal spray bid. Mucinex DM bid  prn. Depo-medrol 40mg  IM today in office. Fluids, rest. Signs/symptoms to call or return for were reviewed and pt expressed understanding.  An After Visit Summary was printed and given to the patient.  FOLLOW UP: prn

## 2013-05-23 NOTE — Patient Instructions (Signed)
Saline nasal spray twice per day. OTC mucinex DM as directed on packaging. Drink plenty of clear liquids (water, mostly). REST.

## 2013-05-23 NOTE — Progress Notes (Signed)
Pre visit review using our clinic review tool, if applicable. No additional management support is needed unless otherwise documented below in the visit note. 

## 2013-05-24 ENCOUNTER — Ambulatory Visit (INDEPENDENT_AMBULATORY_CARE_PROVIDER_SITE_OTHER): Payer: Medicare Other | Admitting: Internal Medicine

## 2013-05-24 ENCOUNTER — Encounter: Payer: Self-pay | Admitting: Internal Medicine

## 2013-05-24 VITALS — BP 141/82 | HR 71 | Ht 71.0 in | Wt 230.0 lb

## 2013-05-24 DIAGNOSIS — I469 Cardiac arrest, cause unspecified: Secondary | ICD-10-CM | POA: Diagnosis not present

## 2013-05-24 DIAGNOSIS — I2589 Other forms of chronic ischemic heart disease: Secondary | ICD-10-CM

## 2013-05-24 DIAGNOSIS — Z95810 Presence of automatic (implantable) cardiac defibrillator: Secondary | ICD-10-CM

## 2013-05-24 DIAGNOSIS — I255 Ischemic cardiomyopathy: Secondary | ICD-10-CM

## 2013-05-24 LAB — MDC_IDC_ENUM_SESS_TYPE_INCLINIC
Battery Remaining Longevity: 51.6 mo
Battery Voltage: 2.57 V
Brady Statistic RV Percent Paced: 0 %
Date Time Interrogation Session: 20150407202024
HighPow Impedance: 54.2774
Implantable Pulse Generator Serial Number: 449388
Lead Channel Impedance Value: 575 Ohm
Lead Channel Pacing Threshold Amplitude: 1 V
Lead Channel Pacing Threshold Amplitude: 1 V
Lead Channel Pacing Threshold Pulse Width: 0.4 ms
Lead Channel Pacing Threshold Pulse Width: 0.4 ms
Lead Channel Sensing Intrinsic Amplitude: 11.2 mV
Lead Channel Setting Pacing Amplitude: 2.5 V
Lead Channel Setting Pacing Pulse Width: 0.4 ms
Lead Channel Setting Sensing Sensitivity: 0.3 mV
Zone Setting Detection Interval: 250 ms
Zone Setting Detection Interval: 300 ms
Zone Setting Detection Interval: 340 ms

## 2013-05-24 NOTE — Patient Instructions (Signed)
Remote monitoring is used to monitor your ICD from home. This monitoring reduces the number of office visits required to check your device to one time per year. It allows [place] to keep an eye on the functioning of your device to ensure it is working properly. You are scheduled for a device check from home on 08-25-2013. You may send your transmission at any time that day. If you have a wireless device, the transmission will be sent automatically. After your physician reviews your transmission, you will receive a postcard with your next transmission date.  Your physician recommends that you schedule a follow-up appointment in: 12 months with Dr.Klein

## 2013-05-24 NOTE — Progress Notes (Signed)
Patient Care Team: Tammi Sou, MD as PCP - General (Family Medicine) Milus Banister, MD as Consulting Physician (Gastroenterology) Landry Corporal, MD as Consulting Physician (Family Medicine) Ailene Rud, MD as Consulting Physician (Urology)   HPI  [name] is a 78 y.o. male is seen in followup for abortive cardiac arrest in the setting of modest ischemic cardiomyopathy with prior bypass surgery and an ejection fraction of 40-45%. He is status post ICD implantation He has had no recurrent events. He has remote syncope.  The patient denies SOB, chest pain, or palpitations; he does have some peripheral edema which is recurrent.  Last catheterization 2008 patent grafts ejection fraction 40-45% with anteroapical scar  The patient denies chest pain, shortness of breath, nocturnal dyspnea, orthopnea or peripheral edema.  There have been no palpitations, lightheadedness or syncope.    Past Medical History  Diagnosis Date  . CAD (coronary artery disease)     CABG 1996  . Cardiac arrest 2008    AICD in right ventricle --placed afterward  . Cardiomyopathy, ischemic   . Type II or unspecified type diabetes mellitus without mention of complication, not stated as uncontrolled 03/2011  . Disorder of bone and cartilage, unspecified   . Anemia, unspecified   . OSA on CPAP     sleep study 2008, severe OSA  . Hypercholesterolemia   . GERD (gastroesophageal reflux disease)   . Hx of adenomatous colonic polyps 2003, 2013  . CRI (chronic renal insufficiency)     CrCl @50ml /min  . BPH with obstruction/lower urinary tract symptoms   . Hypogonadism male     Past Surgical History  Procedure Laterality Date  . Coronary artery bypass graft  1996    3 vessel  . Pacemaker placement  07/2006    St Jude current VRRF  . Colonoscopy w/ polypectomy  2003    Adenomatous; repeat 2006 was recommended but this was not done until 06/10/2011 (by different GI MD)  .  Colonoscopy  06/10/11    One diminutive polyp removed (tubular adenoma, no high grade dysplasia).  Repeat colonoscopy 5 yrs (Dr. Ardis Hughs).  . Polypectomy    . Cardiac defibrillator placement      Current Outpatient Prescriptions  Medication Sig Dispense Refill  . aspirin 81 MG tablet Take 1 tablet (81 mg total) by mouth daily.  30 tablet  6  . atorvastatin (LIPITOR) 20 MG tablet TAKE ONE TABLET BY MOUTH ONE TIME DAILY   90 tablet  1  . b complex vitamins tablet Take 1 tablet by mouth daily.      . Calcium Carbonate (CALCIUM 500 PO) Take 1 tablet by mouth daily.      . furosemide (LASIX) 20 MG tablet 1 tab po qod as needed for increase in lower extremity swelling  30 tablet  1  . KLOR-CON M20 20 MEQ tablet Take 1 tablet by mouth as needed.       [name] lisinopril (PRINIVIL,ZESTRIL) 5 MG tablet TAKE ONE TABLET BY MOUTH ONE TIME DAILY   90 tablet  0  . metoprolol tartrate (LOPRESSOR) 25 MG tablet Take 1 tablet (25 mg total) by mouth 2 (two) times daily.  180 tablet  3  . Multiple Vitamin (MULTIVITAMIN) tablet Take 1 tablet by mouth daily.      . NON FORMULARY OPC 3.  Take 1 tablet by mouth daily.      . NON FORMULARY GLUCOSTATIN. Once daily.      [name]  Omega-3 Fatty Acids (FISH OIL) 1000 MG CPDR Take 1 capsule by mouth daily.      . pantoprazole (PROTONIX) 40 MG tablet TAKE ONE TABLET BY MOUTH ONE TIME DAILY   90 tablet  0  . Tavaborole (KERYDIN) 5 % SOLN Apply 1 drop to each affected nail once daily for 12 months  10 mL  11  . Testosterone (ANDROGEL PUMP) 20.25 MG/ACT (1.62%) GEL Place 60.75 mg onto the skin daily. (Distribute 3 pumps onto both shoulders and upper arms once every morning)  75 g  5  . VITAMIN D-VITAMIN K PO Take 1 tablet by mouth daily.      . Lancets (FREESTYLE) lancets Use as directed to check blood sugar.  DX:250.0  100 each  5   No current facility-administered medications for this visit.    Allergies  Allergen Reactions  . Naproxen Sodium Nausea Only    Review of Systems  negative except from HPI and PMH  Physical Exam BP 141/82  Pulse 71  Ht 5\' 11"  (1.803 m)  Wt 230 lb (104.327 kg)  BMI 32.09 kg/m2 Well developed and well nourished in no acute distress HENT normal E scleral and icterus clear Neck Supple JVP flat; carotids brisk and full Clear to ausculation  Regular rate and rhythm, no murmurs gallops or rub Soft with active bowel sounds No clubbing cyanosis  Edema Alert and oriented, grossly normal motor and sensory function Skin Warm and Dry  ECG  NSR 14/13/41 NSTT  Assessment and  Plan  Ischemic Heart Disease (OA41)  Syncope  ICD  St Jude The patient's device was interrogated.  The information was reviewed. No changes were made in the programming.    Obesity Patient is doing quite well. We'll continue him on his current medications with beta blockers and ACE inhibitors He is on low-dose statins. LDL one year ago however was 58. This is reasonable.  Will work on weight loss. He and his wife are trying to address portion control

## 2013-05-26 ENCOUNTER — Other Ambulatory Visit: Payer: Self-pay

## 2013-06-10 DIAGNOSIS — H18509 Unspecified hereditary corneal dystrophies, unspecified eye: Secondary | ICD-10-CM | POA: Diagnosis not present

## 2013-06-10 DIAGNOSIS — H269 Unspecified cataract: Secondary | ICD-10-CM | POA: Diagnosis not present

## 2013-06-10 DIAGNOSIS — H43399 Other vitreous opacities, unspecified eye: Secondary | ICD-10-CM | POA: Diagnosis not present

## 2013-06-10 DIAGNOSIS — H43819 Vitreous degeneration, unspecified eye: Secondary | ICD-10-CM | POA: Diagnosis not present

## 2013-07-02 ENCOUNTER — Other Ambulatory Visit: Payer: Self-pay | Admitting: Family Medicine

## 2013-07-18 ENCOUNTER — Ambulatory Visit (INDEPENDENT_AMBULATORY_CARE_PROVIDER_SITE_OTHER): Payer: Medicare Other | Admitting: Family Medicine

## 2013-07-18 ENCOUNTER — Encounter: Payer: Self-pay | Admitting: Family Medicine

## 2013-07-18 VITALS — BP 145/79 | HR 78 | Temp 98.0°F | Resp 18 | Ht 71.0 in | Wt 230.0 lb

## 2013-07-18 DIAGNOSIS — E119 Type 2 diabetes mellitus without complications: Secondary | ICD-10-CM | POA: Diagnosis not present

## 2013-07-18 DIAGNOSIS — N183 Chronic kidney disease, stage 3 unspecified: Secondary | ICD-10-CM | POA: Diagnosis not present

## 2013-07-18 DIAGNOSIS — I2510 Atherosclerotic heart disease of native coronary artery without angina pectoris: Secondary | ICD-10-CM

## 2013-07-18 DIAGNOSIS — G4733 Obstructive sleep apnea (adult) (pediatric): Secondary | ICD-10-CM

## 2013-07-18 DIAGNOSIS — Z9989 Dependence on other enabling machines and devices: Secondary | ICD-10-CM

## 2013-07-18 LAB — COMPREHENSIVE METABOLIC PANEL
ALT: 27 U/L (ref 0–53)
AST: 24 U/L (ref 0–37)
Albumin: 4.2 g/dL (ref 3.5–5.2)
Alkaline Phosphatase: 41 U/L (ref 39–117)
BUN: 19 mg/dL (ref 6–23)
CO2: 26 mEq/L (ref 19–32)
Calcium: 9.2 mg/dL (ref 8.4–10.5)
Chloride: 104 mEq/L (ref 96–112)
Creatinine, Ser: 1.3 mg/dL (ref 0.4–1.5)
GFR: 57.12 mL/min — ABNORMAL LOW (ref >60.00)
Glucose, Bld: 117 mg/dL — ABNORMAL HIGH (ref 70–99)
Potassium: 4.2 mEq/L (ref 3.5–5.1)
Sodium: 138 mEq/L (ref 135–145)
Total Bilirubin: 0.9 mg/dL (ref 0.2–1.2)
Total Protein: 7.2 g/dL (ref 6.0–8.3)

## 2013-07-18 LAB — LIPID PANEL
Cholesterol: 108 mg/dL (ref 0–200)
HDL: 34.2 mg/dL — ABNORMAL LOW (ref >39.00)
LDL Cholesterol: 64 mg/dL (ref 0–99)
Total CHOL/HDL Ratio: 3
Triglycerides: 49 mg/dL (ref 0.0–149.0)
VLDL: 9.8 mg/dL (ref 0.0–40.0)

## 2013-07-18 LAB — HEMOGLOBIN A1C: Hgb A1c MFr Bld: 6.7 % — ABNORMAL HIGH (ref 4.6–6.5)

## 2013-07-18 NOTE — Progress Notes (Addendum)
OFFICE NOTE  10/13/2013  CC:  Chief Complaint  Patient presents with  . Follow-up    4 months     HPI: [name] who is here for 4 mo f/u DM 2, hyperlip, CRI stage 3, CAD, and OSA on CPAP. Not checking glucoses.  Says he feels good. 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 Compliant with all meds and also wears CPAP nightly, working on diet.  No formal exercise regimen.   Pertinent PMH:  Past surgical, social, and family history reviewed and no changes noted since last office visit.  MEDS:  Outpatient Prescriptions Prior to Visit  Medication Sig Dispense Refill  . aspirin 81 MG tablet Take 1 tablet (81 mg total) by mouth daily.  30 tablet  6  . b complex vitamins tablet Take 1 tablet by mouth daily.      . Calcium Carbonate (CALCIUM 500 PO) Take 1 tablet by mouth daily.      . furosemide (LASIX) 20 MG tablet 1 tab po qod as needed for increase in lower extremity swelling  30 tablet  1  . KLOR-CON M20 20 MEQ tablet Take 1 tablet by mouth as needed.       [name] lisinopril (PRINIVIL,ZESTRIL) 5 MG tablet TAKE ONE TABLET BY MOUTH ONE TIME DAILY   90 tablet  1  . metoprolol tartrate (LOPRESSOR) 25 MG tablet Take 1 tablet (25 mg total) by mouth 2 (two) times daily.  180 tablet  3  . Multiple Vitamin (MULTIVITAMIN) tablet Take 1 tablet by mouth daily.      . NON FORMULARY OPC 3.  Take 1 tablet by mouth daily.      . NON FORMULARY GLUCOSTATIN. Once daily.      . Omega-3 Fatty Acids (FISH OIL) 1000 MG CPDR Take 1 capsule by mouth daily.      . pantoprazole (PROTONIX) 40 MG tablet TAKE ONE TABLET BY MOUTH ONE TIME DAILY   90 tablet  1  . Testosterone (ANDROGEL PUMP) 20.25 MG/ACT (1.62%) GEL Place 60.75 mg onto the skin daily. (Distribute 3 pumps onto both shoulders and upper arms once every morning)  75 g  5  . VITAMIN D-VITAMIN K PO Take 1 tablet by mouth daily.      [name] atorvastatin (LIPITOR) 20 MG tablet TAKE ONE TABLET BY  MOUTH ONE TIME DAILY   90 tablet  1  . Tavaborole (KERYDIN) 5 % SOLN Apply 1 drop to each affected nail once daily for 12 months  10 mL  11  . Lancets (FREESTYLE) lancets Use as directed to check blood sugar.  DX:250.0  100 each  5   No facility-administered medications prior to visit.    PE: Blood pressure 145/79, pulse 78, temperature 98 F (36.7 C), temperature source Oral, resp. rate 18, height 5\' 11"  (1.803 m), weight 230 lb (104.327 kg), SpO2 94.00%. Gen: Alert, well appearing.  Patient is oriented to person, place, time, and situation. CV: RRR, no m/r/g.   LUNGS: CTA bilat, nonlabored resps, good aeration in all lung fields. EXT: 1+ bilat pitting edema in LL's, with hyperpigmentation changes of chronic venous insuff derm--no erythema or skin breakdown or tenderness.  IMPRESSION AND PLAN:  1) DM 2, hx of good control.  Continue current meds, diet, exercise.  2) Hyperlipidemia: on statin, last lipids 06/2012 were great. Recheck FLP today, AST/ALT as well.  3) CRI, stage 3: BMET today.  4)  Hypogonadism+ ED: seeing Dr. Samara Deist recent T level with him, has f/u planned with him. No labs here for this problem today.  5) CAD/hx of arrest: recent cardiology f/u good, pacer interrogated, no changes made.  6) OSA on CPAP: stable, compliant with CPAP, continue at current setting of 11 cm H20.  An After Visit Summary was printed and given to the patient.  FOLLOW UP: 34mo

## 2013-07-18 NOTE — Progress Notes (Signed)
Pre visit review using our clinic review tool, if applicable. No additional management support is needed unless otherwise documented below in the visit note. 

## 2013-08-09 ENCOUNTER — Telehealth: Payer: Self-pay | Admitting: *Deleted

## 2013-08-09 MED ORDER — TAVABOROLE 5 % EX SOLN: CUTANEOUS | Status: DC

## 2013-08-09 NOTE — Telephone Encounter (Signed)
I returned her call and informed her that Dr. Blenda Mounts said he will try to send it to [facility] to see if it's covered.  I told her to let us know if he has any further problems.  She said they told her from [facility] that they are covered for those that are 76 years old or less.  They told me they had [place] on a call list to call and inform of the change.  She asked the process of this other company.  I told her they would call to verify insurance.  She stated okay let's give it a try.

## 2013-08-09 NOTE — Telephone Encounter (Signed)
Calling for husband.  Calling about his prescription for [place] Mon we got from [facility].  Called for a refill, was told it was going to cost $1000 a month.  We need to go to plan B.  What else would he recommend for his toenails?

## 2013-08-25 ENCOUNTER — Encounter: Payer: Self-pay | Admitting: Internal Medicine

## 2013-08-25 ENCOUNTER — Ambulatory Visit (INDEPENDENT_AMBULATORY_CARE_PROVIDER_SITE_OTHER): Payer: Medicare Other | Admitting: *Deleted

## 2013-08-25 DIAGNOSIS — I469 Cardiac arrest, cause unspecified: Secondary | ICD-10-CM

## 2013-08-25 DIAGNOSIS — Z95810 Presence of automatic (implantable) cardiac defibrillator: Secondary | ICD-10-CM

## 2013-08-25 NOTE — Progress Notes (Signed)
Remote ICD transmission.   

## 2013-08-27 LAB — MDC_IDC_ENUM_SESS_TYPE_REMOTE
Battery Remaining Longevity: 50 mo
Battery Voltage: 2.57 V
Brady Statistic RV Percent Paced: 1 %
Date Time Interrogation Session: 20150709084827
HighPow Impedance: 55 Ohm
Implantable Pulse Generator Serial Number: 449388
Lead Channel Impedance Value: 490 Ohm
Lead Channel Pacing Threshold Amplitude: 1 V
Lead Channel Pacing Threshold Pulse Width: 0.4 ms
Lead Channel Sensing Intrinsic Amplitude: 10 mV
Lead Channel Setting Pacing Amplitude: 2.5 V
Lead Channel Setting Pacing Pulse Width: 0.4 ms
Lead Channel Setting Sensing Sensitivity: 0.3 mV
Zone Setting Detection Interval: 250 ms
Zone Setting Detection Interval: 300 ms
Zone Setting Detection Interval: 340 ms

## 2013-09-21 ENCOUNTER — Encounter: Payer: Self-pay | Admitting: Cardiology

## 2013-09-23 ENCOUNTER — Encounter: Payer: Self-pay | Admitting: Cardiology

## 2013-10-05 ENCOUNTER — Other Ambulatory Visit: Payer: Self-pay | Admitting: Family Medicine

## 2013-10-11 ENCOUNTER — Telehealth: Payer: Self-pay | Admitting: Internal Medicine

## 2013-10-11 NOTE — Telephone Encounter (Signed)
Please advise 

## 2013-10-11 NOTE — Telephone Encounter (Signed)
Caller: [name]/Patient; Phone: [phone]; Reason for Call: [name] is calling about a rx for a CPAP machine; says his is 78yrs old and they are unable to fix it; was told by [facility] that he would need a rx, along with an updated sleep study; had the sleep study done by Dr.Klein; was told the PCP would need to send a rx to [facility]; fax # [phone]; please call with any questions

## 2013-10-11 NOTE — Telephone Encounter (Signed)
New message     Pt needs a presc to get a new cpap machine and need copy of last sleep study.  Please fax to [facility] [phone].

## 2013-10-12 ENCOUNTER — Encounter: Payer: Self-pay | Admitting: Family Medicine

## 2013-10-12 DIAGNOSIS — G4733 Obstructive sleep apnea (adult) (pediatric): Secondary | ICD-10-CM | POA: Insufficient documentation

## 2013-10-12 DIAGNOSIS — Z9989 Dependence on other enabling machines and devices: Secondary | ICD-10-CM

## 2013-10-12 DIAGNOSIS — E291 Testicular hypofunction: Secondary | ICD-10-CM | POA: Diagnosis not present

## 2013-10-12 DIAGNOSIS — N400 Enlarged prostate without lower urinary tract symptoms: Secondary | ICD-10-CM | POA: Diagnosis not present

## 2013-10-12 NOTE — Telephone Encounter (Deleted)
LMOM for pt to CB.  

## 2013-10-12 NOTE — Telephone Encounter (Signed)
Sent staff message to Darlina Guys ( [facility] ) to see if sleep study is UTD.  Awaiting reply.

## 2013-10-12 NOTE — Telephone Encounter (Signed)
Per my records, his sleep study was in 2008. I'll do order for new CPAP machine with supplies and pls see if this sleep study is UTD enough or does he TRULY need a new sleep study?? -thx

## 2013-10-12 NOTE — Telephone Encounter (Addendum)
Per [facility] staff message reply: "To get started we will need the sleep study from 2008. We need an rx for the replacement machine with the settings on the script. We also need a current ov note from the last 6 months that shows the pt has been compliant with the cpap. Once we have all that, we can start to work on it to see if they qualify. We can let you know what else we need as after we review. "  We have everything we need except the OV note stating that he is compliant w/ CPAP.  Has he had recent f/u with Pulm?

## 2013-10-13 NOTE — Telephone Encounter (Signed)
[facility] documents faxed to number provided below w/ confirmation.

## 2013-10-13 NOTE — Telephone Encounter (Signed)
FYI, we should have all the info that his [facility] agency needs now.-thx

## 2013-10-18 DIAGNOSIS — N529 Male erectile dysfunction, unspecified: Secondary | ICD-10-CM | POA: Diagnosis not present

## 2013-10-18 DIAGNOSIS — E291 Testicular hypofunction: Secondary | ICD-10-CM | POA: Diagnosis not present

## 2013-10-19 ENCOUNTER — Encounter: Payer: Self-pay | Admitting: Family Medicine

## 2013-10-19 DIAGNOSIS — I8310 Varicose veins of unspecified lower extremity with inflammation: Secondary | ICD-10-CM | POA: Diagnosis not present

## 2013-10-20 DIAGNOSIS — I8310 Varicose veins of unspecified lower extremity with inflammation: Secondary | ICD-10-CM | POA: Diagnosis not present

## 2013-11-11 DIAGNOSIS — M79609 Pain in unspecified limb: Secondary | ICD-10-CM | POA: Diagnosis not present

## 2013-11-11 DIAGNOSIS — Z9889 Other specified postprocedural states: Secondary | ICD-10-CM | POA: Diagnosis not present

## 2013-11-17 ENCOUNTER — Encounter: Payer: Self-pay | Admitting: Family Medicine

## 2013-11-17 ENCOUNTER — Telehealth: Payer: Self-pay | Admitting: Family Medicine

## 2013-11-17 ENCOUNTER — Encounter: Payer: Medicare Other | Admitting: Family Medicine

## 2013-11-17 DIAGNOSIS — M25559 Pain in unspecified hip: Secondary | ICD-10-CM

## 2013-11-17 NOTE — Telephone Encounter (Signed)
Patient has an appointment with Dr. Alvan Dame 12/14/13 and has rescheduled for tomorrow with Dr. Anitra Lauth.

## 2013-11-17 NOTE — Telephone Encounter (Signed)
OK.  Referral to Dr. Alvan Dame has been ordered as per pt's request. Pls call pt or ask Diane to call and reschedule his visit that I was unable to be here for today.  Again, tell him I apologize for the inconvenience this has caused him.--thx

## 2013-11-17 NOTE — Progress Notes (Signed)
Pre visit review using our clinic review tool, if applicable. No additional management support is needed unless otherwise documented below in the visit note. 

## 2013-11-17 NOTE — Telephone Encounter (Signed)
Please advise referral?  

## 2013-11-17 NOTE — Telephone Encounter (Signed)
Patient Information:  Caller Name: Daved  Phone: [phone]  Patient: [name], [name]  Gender: Male  DOB: 02/01/1936  Age: 78 Years  PCP: [facility])  Office Follow Up:  Does the office need to follow up with this patient?: Yes  Instructions For The Office: Needs appointment with Dr. Anitra Lauth and referral to orthopedist  RN Note:  Patient calling regarding visit that was cancelled by office today.  Appointment scheduled with Dr. Anitra Lauth today was cancelled by office, and he needs a call back to reschedule.  In addition he is requesting a referral to [facility].  He has made an appointment with Dr. Alvan Dame for hip pain which is scheduled for 12/14/13 and needs a referral due to insurance.  Requesting a call  back today.  Symptoms  Reason For Call & Symptoms: needs to reschedule 6 month check up and Needs referral for visit scheduled with Dr. [facility].  Reviewed Health History In EMR: Yes  Reviewed Medications In EMR: Yes  Reviewed Allergies In EMR: Yes  Reviewed Surgeries / Procedures: Yes  Date of Onset of Symptoms: 11/17/2013  Guideline(s) Used:  No Protocol Available - Information Only  Disposition Per Guideline:   Home Care  Reason For Disposition Reached:   Information only question and nurse able to answer  Advice Given:  N/A  Patient Will Follow Care Advice:  YES

## 2013-11-18 ENCOUNTER — Encounter: Payer: Self-pay | Admitting: Family Medicine

## 2013-11-18 ENCOUNTER — Ambulatory Visit (INDEPENDENT_AMBULATORY_CARE_PROVIDER_SITE_OTHER): Payer: Medicare Other | Admitting: Family Medicine

## 2013-11-18 VITALS — BP 112/65 | HR 65 | Temp 98.0°F | Resp 18 | Ht 71.0 in | Wt 239.0 lb

## 2013-11-18 DIAGNOSIS — E119 Type 2 diabetes mellitus without complications: Secondary | ICD-10-CM | POA: Diagnosis not present

## 2013-11-18 DIAGNOSIS — I872 Venous insufficiency (chronic) (peripheral): Secondary | ICD-10-CM | POA: Diagnosis not present

## 2013-11-18 DIAGNOSIS — E291 Testicular hypofunction: Secondary | ICD-10-CM

## 2013-11-18 DIAGNOSIS — I2510 Atherosclerotic heart disease of native coronary artery without angina pectoris: Secondary | ICD-10-CM

## 2013-11-18 DIAGNOSIS — Z23 Encounter for immunization: Secondary | ICD-10-CM | POA: Diagnosis not present

## 2013-11-18 DIAGNOSIS — N183 Chronic kidney disease, stage 3 unspecified: Secondary | ICD-10-CM

## 2013-11-18 DIAGNOSIS — I10 Essential (primary) hypertension: Secondary | ICD-10-CM

## 2013-11-18 LAB — BASIC METABOLIC PANEL
BUN: 21 mg/dL (ref 6–23)
CO2: 26 mEq/L (ref 19–32)
Calcium: 9.4 mg/dL (ref 8.4–10.5)
Chloride: 103 mEq/L (ref 96–112)
Creatinine, Ser: 1.5 mg/dL (ref 0.4–1.5)
GFR: 48.76 mL/min — ABNORMAL LOW (ref >60.00)
Glucose, Bld: 103 mg/dL — ABNORMAL HIGH (ref 70–99)
Potassium: 4.2 mEq/L (ref 3.5–5.1)
Sodium: 135 mEq/L (ref 135–145)

## 2013-11-18 LAB — HEMOGLOBIN A1C: Hgb A1c MFr Bld: 6.2 % (ref 4.6–6.5)

## 2013-11-18 NOTE — Progress Notes (Signed)
OFFICE NOTE  11/18/2013  CC:  Chief Complaint  Patient presents with  . Follow-up   HPI: [name] who is here for routine chronic med issues f/u: HTN, DM 2, hyperlip, polypharm. Updates: hypogonad/ED treatment not much help.  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  If not improved at next f/u with urol he says the plan is to try something else.  Vein disease in legs: has had many laser vein ablation procedures and feels like things are definitely improved. Next f/u is 1 yr at vein clinic.  Wears compression hose, still has some lower leg edema chronically but it is improved.  Compliant with chol and bp med, tries his best to eat diabetic/heart healthy diet.  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  His only complaint of late is some pain in top of left hip on the side, came after getting a new truck that is taller than old one and he had to get up into it dozens of times a day.  It has improved lately since not getting into truck nearly as much. Per his request we entered referral order for him to see Dr. Alvan Dame, an orthopedist who is a personal friend of his who goes to his church and he has already made an appt with him for eval of the hip.  He got another injection into right hand 3rd MCP joint by Dr. Amedeo Plenty.  He is now to the point that Dr. Amedeo Plenty is talking about potentially doing a MCP joint replacement surgery on him.  Pertinent PMH:  Past medical, surgical, social, and family history reviewed and no changes are noted since last office visit.  MEDS:  Outpatient Prescriptions Prior to Visit  Medication Sig Dispense Refill  . aspirin 81 MG tablet Take 1 tablet (81 mg total) by mouth daily.  30 tablet  6  . atorvastatin (LIPITOR) 20 MG tablet TAKE ONE TABLET BY MOUTH ONE TIME DAILY   90 tablet  0  . b complex vitamins tablet Take  1 tablet by mouth daily.      . Calcium Carbonate (CALCIUM 500 PO) Take 1 tablet by mouth daily.      Javier Docker Oil 1000 MG CAPS Take by mouth.      [name] lisinopril (PRINIVIL,ZESTRIL) 5 MG tablet TAKE ONE TABLET BY MOUTH ONE TIME DAILY   90 tablet  1  . metoprolol tartrate (LOPRESSOR) 25 MG tablet Take 1 tablet (25 mg total) by mouth 2 (two) times daily.  180 tablet  3  . Multiple Vitamin (MULTIVITAMIN) tablet Take 1 tablet by mouth daily.      . NON FORMULARY OPC 3.  Take 1 tablet by mouth daily.      . NON FORMULARY GLUCOSTATIN. Once daily.      . pantoprazole (PROTONIX) 40 MG tablet TAKE ONE TABLET BY MOUTH ONE TIME DAILY   90 tablet  1  . Testosterone (ANDROGEL PUMP) 20.25 MG/ACT (1.62%) GEL Place 60.75 mg onto the skin daily. (Distribute 3 pumps onto both shoulders and upper arms once every morning)  75 g  5  . VITAMIN D-VITAMIN K PO Take 1 tablet by mouth daily.      . Lancets (FREESTYLE) lancets Use as directed to check blood sugar.  DX:250.0  100 each  5   No facility-administered  medications prior to visit.    PE: Blood pressure 112/65, pulse 65, temperature 98 F (36.7 C), temperature source Oral, resp. rate 18, height 5\' 11"  (1.803 m), weight 239 lb (108.41 kg), SpO2 93.00%. Gen: Alert, well appearing.  Patient is oriented to person, place, time, and situation. CV: RRR, distant S1 and S2.  No audible m/r/g. Chest is clear, no wheezing or rales. Normal symmetric air entry throughout both lung fields. No chest wall deformities or tenderness. EXT: 2+ pitting LE edema in LL's bilat  LABS: none today Lab Results  Component Value Date   HGBA1C 6.7* 07/18/2013   Lab Results  Component Value Date   WBC 6.8 07/06/2012   HGB 14.5 07/06/2012   HCT 44.0 07/06/2012   MCV 90.6 07/06/2012   PLT 179.0 07/06/2012     Chemistry      Component Value Date/Time   NA 138 07/18/2013 0845   K 4.2 07/18/2013 0845   CL 104 07/18/2013 0845   CO2 26 07/18/2013 0845   BUN 19 07/18/2013 0845   CREATININE 1.3  07/18/2013 0845      Component Value Date/Time   CALCIUM 9.2 07/18/2013 0845   ALKPHOS 41 07/18/2013 0845   AST 24 07/18/2013 0845   ALT 27 07/18/2013 0845   BILITOT 0.9 07/18/2013 0845     Lab Results  Component Value Date   CHOL 108 07/18/2013   HDL 34.20* 07/18/2013   LDLCALC 64 07/18/2013   TRIG 49.0 07/18/2013   CHOLHDL 3 07/18/2013   IMPRESSION AND PLAN:  1) DM 2, diet controlled. HbA1c today. Next visit we'll do urine microalbumin/cr and foot exam. Flu vaccine IM today.  2) HTN; The current medical regimen is effective;  continue present plan and medications. BMET today.  3) Hyperlipidemia; The current medical regimen is effective;  continue present plan and medications.  4) Chronic lower extremity venous disease: improved s/p multiple laser ablations.  5) Hypogonadism w/ED: ongoing care with urology.  An After Visit Summary was printed and given to the patient.  FOLLOW UP: 4 mo

## 2013-11-18 NOTE — Progress Notes (Signed)
Pre visit review using our clinic review tool, if applicable. No additional management support is needed unless otherwise documented below in the visit note. 

## 2013-11-18 NOTE — Patient Instructions (Signed)
Please come prepared for a complete foot exam next visit.

## 2013-11-21 ENCOUNTER — Telehealth: Payer: Self-pay | Admitting: Family Medicine

## 2013-11-21 NOTE — Telephone Encounter (Signed)
emmi emailed °

## 2013-11-23 DIAGNOSIS — E291 Testicular hypofunction: Secondary | ICD-10-CM | POA: Diagnosis not present

## 2013-11-25 DIAGNOSIS — M25552 Pain in left hip: Secondary | ICD-10-CM | POA: Diagnosis not present

## 2013-11-25 DIAGNOSIS — S39012A Strain of muscle, fascia and tendon of lower back, initial encounter: Secondary | ICD-10-CM | POA: Diagnosis not present

## 2013-11-28 ENCOUNTER — Encounter: Payer: Self-pay | Admitting: Internal Medicine

## 2013-11-28 ENCOUNTER — Ambulatory Visit (INDEPENDENT_AMBULATORY_CARE_PROVIDER_SITE_OTHER): Payer: Medicare Other | Admitting: *Deleted

## 2013-11-28 DIAGNOSIS — I469 Cardiac arrest, cause unspecified: Secondary | ICD-10-CM

## 2013-11-28 DIAGNOSIS — I255 Ischemic cardiomyopathy: Secondary | ICD-10-CM | POA: Diagnosis not present

## 2013-11-28 LAB — MDC_IDC_ENUM_SESS_TYPE_REMOTE
Battery Remaining Longevity: 50 mo
Battery Voltage: 2.57 V
Brady Statistic RV Percent Paced: 1 %
Date Time Interrogation Session: 20151012071508
HighPow Impedance: 51 Ohm
Implantable Pulse Generator Serial Number: 449388
Lead Channel Impedance Value: 450 Ohm
Lead Channel Pacing Threshold Amplitude: 1 V
Lead Channel Pacing Threshold Pulse Width: 0.4 ms
Lead Channel Sensing Intrinsic Amplitude: 9.7 mV
Lead Channel Setting Pacing Amplitude: 2.5 V
Lead Channel Setting Pacing Pulse Width: 0.4 ms
Lead Channel Setting Sensing Sensitivity: 0.3 mV
Zone Setting Detection Interval: 250 ms
Zone Setting Detection Interval: 300 ms
Zone Setting Detection Interval: 340 ms

## 2013-11-28 NOTE — Progress Notes (Signed)
Remote ICD transmission.   

## 2013-11-29 DIAGNOSIS — E291 Testicular hypofunction: Secondary | ICD-10-CM | POA: Diagnosis not present

## 2013-11-29 DIAGNOSIS — N5201 Erectile dysfunction due to arterial insufficiency: Secondary | ICD-10-CM | POA: Diagnosis not present

## 2013-12-05 DIAGNOSIS — E291 Testicular hypofunction: Secondary | ICD-10-CM | POA: Diagnosis not present

## 2013-12-12 DIAGNOSIS — E291 Testicular hypofunction: Secondary | ICD-10-CM | POA: Diagnosis not present

## 2013-12-19 DIAGNOSIS — E291 Testicular hypofunction: Secondary | ICD-10-CM | POA: Diagnosis not present

## 2013-12-21 ENCOUNTER — Encounter: Payer: Self-pay | Admitting: Cardiology

## 2013-12-27 ENCOUNTER — Other Ambulatory Visit: Payer: Self-pay | Admitting: Family Medicine

## 2014-01-02 DIAGNOSIS — E291 Testicular hypofunction: Secondary | ICD-10-CM | POA: Diagnosis not present

## 2014-01-06 ENCOUNTER — Encounter: Payer: Self-pay | Admitting: Cardiology

## 2014-01-06 DIAGNOSIS — E291 Testicular hypofunction: Secondary | ICD-10-CM | POA: Diagnosis not present

## 2014-01-18 DIAGNOSIS — E291 Testicular hypofunction: Secondary | ICD-10-CM | POA: Diagnosis not present

## 2014-01-26 ENCOUNTER — Telehealth: Payer: Self-pay | Admitting: Family Medicine

## 2014-01-26 MED ORDER — METOPROLOL TARTRATE 25 MG PO TABS: 25.0000 mg | ORAL_TABLET | Freq: Two times a day (BID) | ORAL | Status: DC

## 2014-01-26 NOTE — Telephone Encounter (Signed)
Needs a refill on his Metoprolol 25mg 

## 2014-01-26 NOTE — Telephone Encounter (Signed)
Rx sent to [facility]

## 2014-02-01 DIAGNOSIS — E291 Testicular hypofunction: Secondary | ICD-10-CM | POA: Diagnosis not present

## 2014-02-02 ENCOUNTER — Other Ambulatory Visit: Payer: Self-pay | Admitting: Family Medicine

## 2014-02-02 MED ORDER — METOPROLOL TARTRATE 25 MG PO TABS: 25.0000 mg | ORAL_TABLET | Freq: Two times a day (BID) | ORAL | Status: DC

## 2014-02-15 DIAGNOSIS — E291 Testicular hypofunction: Secondary | ICD-10-CM | POA: Diagnosis not present

## 2014-03-01 DIAGNOSIS — E291 Testicular hypofunction: Secondary | ICD-10-CM | POA: Diagnosis not present

## 2014-03-02 ENCOUNTER — Ambulatory Visit (INDEPENDENT_AMBULATORY_CARE_PROVIDER_SITE_OTHER): Payer: Medicare Other | Admitting: *Deleted

## 2014-03-02 DIAGNOSIS — I469 Cardiac arrest, cause unspecified: Secondary | ICD-10-CM

## 2014-03-02 NOTE — Progress Notes (Signed)
Remote ICD transmission.   

## 2014-03-06 LAB — MDC_IDC_ENUM_SESS_TYPE_REMOTE
Battery Remaining Longevity: 28 mo
Battery Voltage: 2.56 V
Brady Statistic RV Percent Paced: 1 %
Date Time Interrogation Session: 20160114094155
HighPow Impedance: 50 Ohm
Implantable Pulse Generator Serial Number: 449388
Lead Channel Impedance Value: 450 Ohm
Lead Channel Pacing Threshold Amplitude: 1 V
Lead Channel Pacing Threshold Pulse Width: 0.4 ms
Lead Channel Sensing Intrinsic Amplitude: 11.2 mV
Lead Channel Setting Pacing Amplitude: 2.5 V
Lead Channel Setting Pacing Pulse Width: 0.4 ms
Lead Channel Setting Sensing Sensitivity: 0.3 mV
Zone Setting Detection Interval: 250 ms
Zone Setting Detection Interval: 300 ms
Zone Setting Detection Interval: 340 ms

## 2014-03-15 DIAGNOSIS — E291 Testicular hypofunction: Secondary | ICD-10-CM | POA: Diagnosis not present

## 2014-03-22 ENCOUNTER — Encounter: Payer: Self-pay | Admitting: Family Medicine

## 2014-03-22 ENCOUNTER — Ambulatory Visit (INDEPENDENT_AMBULATORY_CARE_PROVIDER_SITE_OTHER): Payer: Medicare Other | Admitting: Family Medicine

## 2014-03-22 VITALS — BP 127/80 | HR 84 | Temp 98.7°F | Resp 18 | Ht 71.0 in | Wt 247.0 lb

## 2014-03-22 DIAGNOSIS — E785 Hyperlipidemia, unspecified: Secondary | ICD-10-CM | POA: Diagnosis not present

## 2014-03-22 DIAGNOSIS — I10 Essential (primary) hypertension: Secondary | ICD-10-CM | POA: Diagnosis not present

## 2014-03-22 DIAGNOSIS — E118 Type 2 diabetes mellitus with unspecified complications: Secondary | ICD-10-CM

## 2014-03-22 DIAGNOSIS — Z79899 Other long term (current) drug therapy: Secondary | ICD-10-CM

## 2014-03-22 LAB — MICROALBUMIN / CREATININE URINE RATIO
Creatinine,U: 223.8 mg/dL
Microalb Creat Ratio: 0.3 mg/g (ref 0.0–30.0)
Microalb, Ur: 0.7 mg/dL (ref 0.0–1.9)

## 2014-03-22 NOTE — Progress Notes (Signed)
Pre visit review using our clinic review tool, if applicable. No additional management support is needed unless otherwise documented below in the visit note. 

## 2014-03-22 NOTE — Progress Notes (Signed)
OFFICE NOTE  03/22/2014  CC:  Chief Complaint  Patient presents with  . Follow-up   HPI: Patient is a 79 y.o. Caucasian male who is here for 4 mo f/u DM 2, HTN, hyperlip, polypharm.  Feet: no burning, tingling, or numbness. No home monitoring of glucoses or blood pressures.   No questions about his meds today, states compliance with all meds today. Needs to work on diet/exercise-the holidays got in the way of his good habits.  Pertinent PMH:  Past medical, surgical, social, and family history reviewed and no changes are noted since last office visit.  MEDS:  Outpatient Prescriptions Prior to Visit  Medication Sig Dispense Refill  . aspirin 81 MG tablet Take 1 tablet (81 mg total) by mouth daily. 30 tablet 6  . atorvastatin (LIPITOR) 20 MG tablet TAKE ONE TABLET BY MOUTH ONE TIME DAILY  90 tablet 0  . b complex vitamins tablet Take 1 tablet by mouth daily.    . Calcium Carbonate (CALCIUM 500 PO) Take 1 tablet by mouth daily.    Javier Docker Oil 1000 MG CAPS Take by mouth.    [name] lisinopril (PRINIVIL,ZESTRIL) 5 MG tablet TAKE ONE TABLET BY MOUTH ONE TIME DAILY  90 tablet 0  . metoprolol tartrate (LOPRESSOR) 25 MG tablet Take 1 tablet (25 mg total) by mouth 2 (two) times daily. 180 tablet 1  . Multiple Vitamin (MULTIVITAMIN) tablet Take 1 tablet by mouth daily.    . NON FORMULARY OPC 3.  Take 1 tablet by mouth daily.    . NON FORMULARY GLUCOSTATIN. Once daily.    . pantoprazole (PROTONIX) 40 MG tablet TAKE ONE TABLET BY MOUTH ONE TIME DAILY  90 tablet 0  . Testosterone (ANDROGEL PUMP) 20.25 MG/ACT (1.62%) GEL Place 60.75 mg onto the skin daily. (Distribute 3 pumps onto both shoulders and upper arms once every morning) 75 g 5  . VITAMIN D-VITAMIN K PO Take 1 tablet by mouth daily.    . Lancets (FREESTYLE) lancets Use as directed to check blood sugar.  DX:250.0 100 each 5   No facility-administered medications prior to visit.    PE: Blood pressure 127/80, pulse 84, temperature 98.7 F  (37.1 C), temperature source Temporal, resp. rate 18, height 5\' 11"  (1.803 m), weight 247 lb (112.038 kg), SpO2 95 %. Gen: Alert, well appearing.  Patient is oriented to person, place, time, and situation. Foot exam - bilateral normal except hallux valgus deformities noted + toe overlapping/crowding bilat; no swelling, tenderness or skin or vascular lesions. Color and temperature is normal. Sensation is intact. Peripheral pulses are palpable. Toenails are thickened, white matter underneath most of them.  LAB: none today RECENT:  Lab Results  Component Value Date   HGBA1C 6.2 11/18/2013     Chemistry      Component Value Date/Time   NA 135 11/18/2013 1145   K 4.2 11/18/2013 1145   CL 103 11/18/2013 1145   CO2 26 11/18/2013 1145   BUN 21 11/18/2013 1145   CREATININE 1.5 11/18/2013 1145      Component Value Date/Time   CALCIUM 9.4 11/18/2013 1145   ALKPHOS 41 07/18/2013 0845   AST 24 07/18/2013 0845   ALT 27 07/18/2013 0845   BILITOT 0.9 07/18/2013 0845      IMPRESSION AND PLAN:  1) DM 2, control historically good on diet/exercise alone. Feet exam normal today from a peripheral neuropathy standpoint. Urine microalb/cr today.  2) HTN; The current medical regimen is effective;  continue present plan  and medications.  3) Hyperlipidemia: The current medical regimen is effective;  continue present plan and medications.  4) Polypharmacy: reviewed med list in detail and he is clear on what he takes and what each med is for, no problems with any at this time.  No blood work or vaccines due today. At next visit we'll do fasting lipids, CMEt, and HbA1c.  An After Visit Summary was printed and given to the patient.  FOLLOW UP: 4 mo 30 min f/u visit-fasting

## 2014-03-24 DIAGNOSIS — M1811 Unilateral primary osteoarthritis of first carpometacarpal joint, right hand: Secondary | ICD-10-CM | POA: Diagnosis not present

## 2014-03-30 DIAGNOSIS — M25561 Pain in right knee: Secondary | ICD-10-CM | POA: Diagnosis not present

## 2014-03-30 DIAGNOSIS — M2391 Unspecified internal derangement of right knee: Secondary | ICD-10-CM | POA: Diagnosis not present

## 2014-03-30 DIAGNOSIS — S838X1A Sprain of other specified parts of right knee, initial encounter: Secondary | ICD-10-CM | POA: Diagnosis not present

## 2014-04-03 ENCOUNTER — Other Ambulatory Visit: Payer: Self-pay | Admitting: Family Medicine

## 2014-04-03 ENCOUNTER — Telehealth: Payer: Self-pay | Admitting: Internal Medicine

## 2014-04-03 NOTE — Telephone Encounter (Signed)
Please advise 

## 2014-04-03 NOTE — Telephone Encounter (Signed)
Patient recently injured his knee and Orthopedics wanted to do an MRI.  The patient was calling to be advised of an alternative to the MRI and at this time with his St. Jude 1207 a CT is advised.

## 2014-04-03 NOTE — Telephone Encounter (Signed)
New message      Pt has a defibulator and need an xray on his knee.  Talk to the nurse to see if it is ok

## 2014-04-04 ENCOUNTER — Other Ambulatory Visit: Payer: Self-pay | Admitting: Family Medicine

## 2014-04-04 DIAGNOSIS — M25561 Pain in right knee: Secondary | ICD-10-CM | POA: Diagnosis not present

## 2014-04-04 DIAGNOSIS — S838X1D Sprain of other specified parts of right knee, subsequent encounter: Secondary | ICD-10-CM | POA: Diagnosis not present

## 2014-04-05 ENCOUNTER — Telehealth: Payer: Self-pay | Admitting: Family Medicine

## 2014-04-05 NOTE — Telephone Encounter (Signed)
Noted  

## 2014-04-05 NOTE — Telephone Encounter (Signed)
Patient Name: [name]  DOB: 1936/02/04    Initial Comment Caller states he is having leg cramps.    Nurse Assessment  Nurse: Mallie Mussel, RN, Alveta Heimlich Date/Time Eilene Ghazi Time): 04/05/2014 1:17:24 PM  Confirm and document reason for call. If symptomatic, describe symptoms. ---Caller states that he has been having leg cramps in both legs beginning a year ago. It happens about once a month usually. It is happening more frequently now. He talked to a pharmacist who advised him to take a potassium supplement. He just began taking it yesterday. His last episode was an hour ago. Denies pain at present. He wants to try the potassium gluteamate for a week to see if it helps. If it doesn't help, he will call back for an appointment.  Has the patient traveled out of the country within the last 30 days? ---Not Applicable  Does the patient require triage? ---No     Guidelines    Guideline Title Affirmed Question Affirmed Notes       Final Disposition User   Clinical Call Mallie Mussel, RN, Alveta Heimlich

## 2014-04-06 ENCOUNTER — Other Ambulatory Visit: Payer: Self-pay | Admitting: Sports Medicine

## 2014-04-06 DIAGNOSIS — S838X1D Sprain of other specified parts of right knee, subsequent encounter: Secondary | ICD-10-CM

## 2014-04-07 ENCOUNTER — Ambulatory Visit
Admission: RE | Admit: 2014-04-07 | Discharge: 2014-04-07 | Disposition: A | Discharge status: Home / Self Care | Payer: Medicare Other | Source: Ambulatory Visit | Attending: Sports Medicine | Admitting: Sports Medicine

## 2014-04-07 DIAGNOSIS — S83241A Other tear of medial meniscus, current injury, right knee, initial encounter: Secondary | ICD-10-CM | POA: Diagnosis not present

## 2014-04-07 DIAGNOSIS — M25561 Pain in right knee: Secondary | ICD-10-CM | POA: Diagnosis not present

## 2014-04-07 DIAGNOSIS — S838X1D Sprain of other specified parts of right knee, subsequent encounter: Secondary | ICD-10-CM

## 2014-04-07 MED ORDER — IOHEXOL 180 MG/ML  SOLN
40.0000 mL | Freq: Once | INTRAMUSCULAR | Status: AC | PRN
  Administered 2014-04-07: 40 mL via INTRA_ARTICULAR

## 2014-04-11 DIAGNOSIS — R2689 Other abnormalities of gait and mobility: Secondary | ICD-10-CM | POA: Diagnosis not present

## 2014-04-11 DIAGNOSIS — M2391 Unspecified internal derangement of right knee: Secondary | ICD-10-CM | POA: Diagnosis not present

## 2014-04-11 DIAGNOSIS — M25561 Pain in right knee: Secondary | ICD-10-CM | POA: Diagnosis not present

## 2014-04-11 DIAGNOSIS — S838X1D Sprain of other specified parts of right knee, subsequent encounter: Secondary | ICD-10-CM | POA: Diagnosis not present

## 2014-04-13 DIAGNOSIS — S83231A Complex tear of medial meniscus, current injury, right knee, initial encounter: Secondary | ICD-10-CM | POA: Diagnosis not present

## 2014-04-17 ENCOUNTER — Telehealth: Payer: Self-pay | Admitting: Internal Medicine

## 2014-04-17 NOTE — Telephone Encounter (Signed)
Informed patient that he would need to be seen in office by PA/NP for clearance, as it has been almost a year since he has been seen by cardiology. Patient is agreeable and would like to make ASAP.  informed him scheduler would call him in a few minutes to arrange OV. He is agreeable.  I also called Abigail Butts at [facility] and informed her patient being seen tomorrow at 2:45 pm for clearance.

## 2014-04-17 NOTE — Telephone Encounter (Signed)
New message      Request for surgical clearance:  1. What type of surgery is being performed? Knee surgery  When is this surgery scheduled? Not scheduled----pending clearance 2. Are there any medications that need to be held prior to surgery and how long?   No--  Name of physician performing surgery?  Dr Matilde Haymaker 3. What is your office phone and fax number? [phone] ext [phone] attn wendy

## 2014-04-18 ENCOUNTER — Ambulatory Visit (INDEPENDENT_AMBULATORY_CARE_PROVIDER_SITE_OTHER): Payer: Medicare Other | Admitting: Physician Assistant

## 2014-04-18 ENCOUNTER — Encounter: Payer: Self-pay | Admitting: Physician Assistant

## 2014-04-18 VITALS — BP 132/68 | HR 71 | Ht 71.0 in | Wt 230.1 lb

## 2014-04-18 DIAGNOSIS — I10 Essential (primary) hypertension: Secondary | ICD-10-CM

## 2014-04-18 DIAGNOSIS — Z01810 Encounter for preprocedural cardiovascular examination: Secondary | ICD-10-CM

## 2014-04-18 DIAGNOSIS — S8990XS Unspecified injury of unspecified lower leg, sequela: Secondary | ICD-10-CM | POA: Diagnosis not present

## 2014-04-18 DIAGNOSIS — E785 Hyperlipidemia, unspecified: Secondary | ICD-10-CM

## 2014-04-18 DIAGNOSIS — I255 Ischemic cardiomyopathy: Secondary | ICD-10-CM | POA: Diagnosis not present

## 2014-04-18 DIAGNOSIS — Z8674 Personal history of sudden cardiac arrest: Secondary | ICD-10-CM

## 2014-04-18 DIAGNOSIS — I2510 Atherosclerotic heart disease of native coronary artery without angina pectoris: Secondary | ICD-10-CM | POA: Diagnosis not present

## 2014-04-18 HISTORY — PX: CARDIOVASCULAR STRESS TEST: SHX262

## 2014-04-18 HISTORY — PX: KNEE CARTILAGE SURGERY: SHX688

## 2014-04-18 NOTE — Patient Instructions (Addendum)
Your physician recommends that you continue on your current medications as directed. Please refer to the Current Medication list given to you today.    Your physician has requested that you have a lexiscan myoview. For further information please visit [email or web address]. Please follow instruction sheet, as given.   FOLLOW UP DR Caryl Comes IN 6 WEEKS  The website to look for medication coupons is [email or web address]. Please ask the pharmacist at your usual pharmacy if you are a candidate to use the Crestor copay card to get your medication for a cheaper cost.

## 2014-04-18 NOTE — Progress Notes (Signed)
Cardiology Office Note Date:  04/18/2014  Patient ID:  [name], DOB 11/27/1935, MRN [id number] PCP:  Tammi Sou, MD  Cardiologist:  Dr. Ron Parker remotely? (before 2004) Electrophysiologist: Dr. Caryl Comes  Chief Complaint: here for surgical clearance  History of Present Illness: Lawrance Wiedemann is a 79 y.o. male with history of CAD (MI/CABG 1996), aborted cardiac arrest in 2008 s/p St. Jude ICD, ICM (EF 40-45% by cath in 2008) HLD, obesity, GERD, CKD III, DM, HTN, OSA who presents for pre-operative evaluation. Very remotely he is said to have been followed by Dr. Ron Parker but this is at least before 2004. He presented with aborted cardiac arrest in 2008 after he had not seen a cardiologist in several years. This admission was c/b encephalopathy, VDRF, aspiriation PNA, CHF, elevated LFTs. Cath at that time showed severe native 3v disease, 3/3 grafts widely patent, mild to moderate left ventricular systolic dysfunction secondary to anteroapical scar, EF 40-45%. EP recommended ICD implantation and he has been followed by Dr. Caryl Comes since that time. Last 2D Echo 2008: limited images, septum akinetic, posterior wall moves, cannot assess further, LA size upper limits of normal. Most recent labs 11/2013 with Cr 1.5, A1C 6.2. Last CBC in 2014 was 14.5, last lipids 07/2013 with LDL 64 (followed by PCP).  He comes in today discuss pre-operative evaluation. He was shoveling snow a few weeks ago when he twisted his knee and tore his meniscus. The patient says that Dr. Gladstone Lighter wants to perform arthroscopic repair. From a cardiac standpoint he is doing well. Up until the injury, he was walking the dog 3x/day without any limitation. He is actively remodeling a house and has to climb up and down ladders. He has not had any recent CP, SOB, ICD discharge, syncope, LEE, orthopnea, or palpitations. He realized a few weeks ago that he was gaining weight so he's made some lifestyle modifications in attempt to slim  down.  Past Medical History  Diagnosis Date  . CAD (coronary artery disease)     a. MI/CABG 1996. b. VF arrest 2008: cath with severe native 3v disease, 3/3 grafts widely patent, mild to moderate left ventricular systolic dysfunction secondary to anteroapical scar. EF 40-45%.  . Cardiac arrest 2008    2008: aborted cardiac arrest 2008 (c/b encephalopathy, VDRF, aspiriation PNA, CHF, elevated LFTs), s/p St. Jude ICD placement.  . Ischemic cardiomyopathy     a. EF 40-45% by cath 2008.  . Diabetes mellitus, type 2 03/2011  . Disorder of bone and cartilage, unspecified   . Anemia, unspecified   . OSA on CPAP     sleep study 2008, severe OSA: CPAP 11 cm H2O  . Hypercholesterolemia   . GERD (gastroesophageal reflux disease)   . Hx of adenomatous colonic polyps 2003, 2013  . CKD (chronic kidney disease), stage III     CrCl @50ml /min  . BPH with obstruction/lower urinary tract symptoms   . Hypogonadism male     managed by [facility] (Dr. Gaynelle Arabian)  . Erectile dysfunction   . Essential hypertension   . Chronic venous insufficiency   . Obesity   . Arthritis     Past Surgical History  Procedure Laterality Date  . Coronary artery bypass graft  1996    3 vessel  . Pacemaker placement  07/2006    St Jude current VRRF  . Colonoscopy w/ polypectomy  2003    Adenomatous; repeat 2006 was recommended but this was not done until 06/10/2011 (by different GI MD)  .  Colonoscopy  06/10/11    One diminutive polyp removed (tubular adenoma, no high grade dysplasia).  Repeat colonoscopy 5 yrs (Dr. Ardis Hughs).  . Polypectomy    . Cardiac defibrillator placement      Current Outpatient Prescriptions  Medication Sig Dispense Refill  . aspirin 81 MG tablet Take 1 tablet (81 mg total) by mouth daily. 30 tablet 6  . atorvastatin (LIPITOR) 20 MG tablet TAKE ONE TABLET BY MOUTH ONE TIME DAILY  90 tablet 1  . b complex vitamins tablet Take 1 tablet by mouth daily.    . Calcium Carbonate (CALCIUM 500 PO)  Take 1 tablet by mouth daily.    [name] HYDROcodone-acetaminophen (NORCO/VICODIN) 5-325 MG per tablet Take 1 tablet by mouth every 6 (six) hours as needed.     Javier Docker Oil 1000 MG CAPS Take by mouth.    [name] lisinopril (PRINIVIL,ZESTRIL) 5 MG tablet TAKE ONE TABLET BY MOUTH ONE TIME DAILY  90 tablet 3  . metoprolol tartrate (LOPRESSOR) 25 MG tablet Take 1 tablet (25 mg total) by mouth 2 (two) times daily. 180 tablet 1  . Multiple Vitamin (MULTIVITAMIN) tablet Take 1 tablet by mouth daily.    . NON FORMULARY OPC 3.  Take 1 tablet by mouth daily.    . NON FORMULARY GLUCOSTATIN. Once daily.    . pantoprazole (PROTONIX) 40 MG tablet TAKE ONE TABLET BY MOUTH ONE TIME DAILY  90 tablet 3  . sildenafil (REVATIO) 20 MG tablet Take 20 mg by mouth as needed.    . Testosterone (ANDROGEL PUMP) 20.25 MG/ACT (1.62%) GEL Place 60.75 mg onto the skin daily. (Distribute 3 pumps onto both shoulders and upper arms once every morning) 75 g 5  . VITAMIN D-VITAMIN K PO Take 1 tablet by mouth daily.     No current facility-administered medications for this visit.    Allergies:   Naproxen sodium   Social History:  The patient  reports that he quit smoking about 44 years ago. His smoking use included Cigarettes. He has never used smokeless tobacco. He reports that he drinks about 4.2 oz of alcohol per week. He reports that he does not use illicit drugs.   Family History:  The patient's family history includes Heart disease in his father and mother; Stroke in his mother. There is no history of Colon cancer, Esophageal cancer, Stomach cancer, or Rectal cancer.  ROS:  Please see the history of present illness.  All other systems are reviewed and otherwise negative.   PHYSICAL EXAM:  VS:  BP 132/68 mmHg  Pulse 71  Ht 5\' 11"  (1.803 m)  Wt 230 lb 1.9 oz (104.382 kg)  BMI 32.11 kg/m2 BMI: Body mass index is 32.11 kg/(m^2). Well nourished, well developed WM, in no acute distress HEENT: normocephalic, atraumatic Neck: no JVD,  negative for carotid bruits Cardiac:  normal S1, S2; RRR; no murmur Lungs:  clear to auscultation bilaterally, no wheezing, rhonchi or rales Abd: soft, nontender, no hepatomegaly Ext: no edema Skin: warm and dry Neuro:  moves all extremities spontaneously, no focal abnormalities noted  EKG:  NSR 71bpm, LVH with QRS widening and repolarizaiton abnormality, TWI I, avL, no significant change from prior  Recent Labs: 07/18/2013: ALT 27 11/18/2013: BUN 21; Creatinine 1.5; Potassium 4.2; Sodium 135  07/18/2013: Cholesterol, Total 108; HDL-C 34.20*; LDL (calc) 64; Total CHOL/HDL Ratio 3; Triglycerides 49.0; VLDL 9.8   CrCl cannot be calculated (Patient has no serum creatinine result on file.).   Wt Readings from Last 3  Encounters:  04/18/14 230 lb 1.9 oz (104.382 kg)  03/22/14 247 lb (112.038 kg)  11/18/13 239 lb (108.41 kg)     Other studies reviewed: Additional studies/records reviewed today include: summarized above  ASSESSMENT AND PLAN:  1. Meniscal knee injury requiring repair/pre-operative cardiovascular exam - the patient has remained fairly active and denies any recent anginal symptoms. EKG is grossly unchanged. Discussed with Dr. Caryl Comes who feels that although he has not had recent anginal symptoms, the most prudent thing to do is to proceed with nuclear stress testing for risk stratification given the age of his grafts and cardiac history. We will proceed with Lexiscan since he is unable to ambulate on the treadmill due to his knee injury. 2. CAD s/p CABG 1996, last cath 2008 for medical therapy - continue ASA, BB, statin. See above. 3. Ischemic cardiomyopathy - appears euvolemic. Continue BB, lisinopril. Reassess EF via nuc. 4. H/o out-of-hospital aborted cardiac arrest in 2008 s/p ICD - device is followed by Dr. Caryl Comes. No recent reported events. 5. Essential hypertension - controlled on present regimen. 6. Hyperlipidemia - last LDL 07/2013 was at goal. This is followed by primary care.  He does report that his insurance recently dropped [employer] from their formulary where he was getting Lipitor at an affordable price. He was quoted a much more expensive price at [facility]. I told him about GoodRx.com which has coupons for Lipitor that you can print. We also have Crestor copay cards here in the office today - I offered him one and asked him to have his pharmacist run the price to see if it will bring the cost down. He will let us know if it is more affordable, at which time I'd switch him to the equivalent dose of Crestor 10mg  daily. 7. CKD III - followed by primary care.  Disposition: F/u with Dr. Caryl Comes in 6 weeks. Depending on Dr. Olin Pia preference, we might want to set him up with a primary cardiologist to follow his non-device cardiac issues going forward.  Current medicines are reviewed at length with the patient today.  The patient did not have any concerns regarding medicines.  Raechel Ache PA-C 04/18/2014 3:56 PM     [address] [place] [address] [phone] (office)  [phone] (fax)

## 2014-04-19 ENCOUNTER — Encounter: Payer: Self-pay | Admitting: Family Medicine

## 2014-04-19 DIAGNOSIS — N138 Other obstructive and reflux uropathy: Secondary | ICD-10-CM | POA: Diagnosis not present

## 2014-04-19 DIAGNOSIS — N5201 Erectile dysfunction due to arterial insufficiency: Secondary | ICD-10-CM | POA: Diagnosis not present

## 2014-04-19 DIAGNOSIS — N401 Enlarged prostate with lower urinary tract symptoms: Secondary | ICD-10-CM | POA: Diagnosis not present

## 2014-04-19 DIAGNOSIS — E291 Testicular hypofunction: Secondary | ICD-10-CM | POA: Diagnosis not present

## 2014-04-24 ENCOUNTER — Ambulatory Visit (HOSPITAL_COMMUNITY): Payer: Medicare Other | Attending: Internal Medicine | Admitting: Radiology

## 2014-04-24 DIAGNOSIS — I2510 Atherosclerotic heart disease of native coronary artery without angina pectoris: Secondary | ICD-10-CM | POA: Diagnosis not present

## 2014-04-24 DIAGNOSIS — Z01810 Encounter for preprocedural cardiovascular examination: Secondary | ICD-10-CM | POA: Diagnosis not present

## 2014-04-24 MED ORDER — REGADENOSON 0.4 MG/5ML IV SOLN
0.4000 mg | Freq: Once | INTRAVENOUS | Status: AC
  Administered 2014-04-24: 0.4 mg via INTRAVENOUS

## 2014-04-24 MED ORDER — TECHNETIUM TC 99M SESTAMIBI GENERIC - CARDIOLITE
33.0000 | Freq: Once | INTRAVENOUS | Status: AC | PRN
  Administered 2014-04-24: 33 via INTRAVENOUS

## 2014-04-24 MED ORDER — TECHNETIUM TC 99M SESTAMIBI GENERIC - CARDIOLITE
11.0000 | Freq: Once | INTRAVENOUS | Status: AC | PRN
  Administered 2014-04-24: 11 via INTRAVENOUS

## 2014-04-24 NOTE — Progress Notes (Signed)
[facility] [place], [place] [address] [phone]    Cardiology Nuclear Med Study  [name] is a 79 y.o. male     MRN : [id number]     DOB: 05/23/1935  Procedure Date: 04/24/2014  Nuclear Med Background Indication for Stress Test:  Evaluation for Ischemia and Surgical Clearance:Knee Surgery with Dr. Gladstone Lighter History:  CAD-CABG AICD H/O Cardiac Arrest Cardiac Risk Factors: Hypertension and NIDDM  Symptoms:  DOE and Fatigue   Nuclear Pre-Procedure Caffeine/Decaff Intake:  None NPO After: 7:30am   Lungs:  clear O2 Sat: 95% on room air. IV 0.9% NS with Angio Cath:  22g  IV Site: R Wrist  IV Started by:  Matilde Haymaker, RN  Chest Size (in):  44 Cup Size: n/a  Height: 5\' 11"  (1.803 m)  Weight:  229 lb (103.874 kg)  BMI:  Body mass index is 31.95 kg/(m^2). Tech Comments:  Lopressor taken at 0700    Nuclear Med Study 1 or 2 day study: 1 day  Stress Test Type:  Carlton Adam  Reading MD: n/a  Order Authorizing Provider:  Herbert Pun and Williemae Natter  Resting Radionuclide: Technetium 16m Sestamibi  Resting Radionuclide Dose: 11.0 mCi   Stress Radionuclide:  Technetium 82m Sestamibi  Stress Radionuclide Dose: 33.0 mCi           Stress Protocol Rest HR: 55 Stress HR: 82  Rest BP: 119/57 Stress BP: 116/61  Exercise Time (min): n/a METS: n/a   Predicted Max HR: 144 bpm % Max HR: 56.94 bpm Rate Pressure Product: 9758   Dose of Adenosine (mg):  n/a Dose of Lexiscan: 0.4 mg  Dose of Atropine (mg): n/a Dose of Dobutamine: n/a mcg/kg/min (at max HR)  Stress Test Technologist: Perrin Maltese, EMT-P  Nuclear Technologist:  Vedia Pereyra, CNMT     Rest Procedure:  Myocardial perfusion imaging was performed at rest 45 minutes following the intravenous administration of Technetium 45m Sestamibi. Rest ECG: SB, iLBBB  Stress Procedure:  The patient received IV Lexiscan 0.4 mg over 15-seconds.  Technetium 76m Sestamibi injected  at 30-seconds. This patient had sob and a headache with the Lexiscan injection. Quantitative spect images were obtained after a 45 minute delay. Stress ECG: No significant change from baseline ECG  QPS Raw Data Images:  Normal; no motion artifact; normal heart/lung ratio. Stress Images:  There is decreased uptake in the entire anterior, true apex, mid and basal anteroseptal, inferoseptal walls and in the basal inferior walls.  Rest Images:  There is decreased uptake in the entire anterior, true apex, mid and basal anteroseptal, inferoseptal walls and in the basal inferior walls.  Subtraction (SDS):  There is a large irreversible defect in the entire anterior, true apex, mid and basal anteroseptal, inferoseptal walls and in the basal inferior walls and a small size, mild severity reversible defect in the basal and mid inferior walls (SDS 3).  Transient Ischemic Dilatation (Normal <1.22):  1.01 Lung/Heart Ratio (Normal <0.45):  0.34  Quantitative Gated Spect Images QGS EDV:  185 ml QGS ESV:  112 ml  Impression Exercise Capacity:  Lexiscan with no exercise. BP Response:  Hypotensive blood pressure response. Clinical Symptoms:  There is dyspnea. ECG Impression:  No significant ST segment change suggestive of ischemia. Comparison with Prior Nuclear Study: No previous nuclear study performed  Overall Impression:  High risk stress nuclear study with a large scar in the mid and apical anterior, true apex, basal and mid anteroseptal, inferoseptal  and apical lateral walls consistent with a large scar in the entire LAD and partially in the RCA territory with peri-infarct ischemia in the PDA territory (SDS 3).  .  LV Ejection Fraction: 39%.  LV Wall Motion:  Akinesis of the anterior and septal walls with apical dyskinesis.    Dorothy Spark 04/24/2014

## 2014-04-26 ENCOUNTER — Encounter: Payer: Self-pay | Admitting: Cardiology

## 2014-04-26 DIAGNOSIS — E291 Testicular hypofunction: Secondary | ICD-10-CM | POA: Diagnosis not present

## 2014-04-27 ENCOUNTER — Telehealth: Payer: Self-pay | Admitting: Internal Medicine

## 2014-04-27 NOTE — Telephone Encounter (Signed)
Follow Up       Pt calling wanting stress test results. Please call back and advise.

## 2014-04-27 NOTE — Telephone Encounter (Signed)
New Message         Pt calling to get stress test results. Please call back and advise.

## 2014-04-27 NOTE — Telephone Encounter (Signed)
Informed patient Dr. Caryl Comes has not reviewed yet.  Patient aware I will call him once reviewed.

## 2014-04-28 ENCOUNTER — Encounter: Payer: Self-pay | Admitting: Family Medicine

## 2014-04-28 NOTE — Telephone Encounter (Signed)
Left message for patient to call me back.  Called Sherri at [employer] [facility] [employer] and asked that she re-fax clearance request to me.

## 2014-04-28 NOTE — Telephone Encounter (Signed)
Informed patient Dr. Caryl Comes reviewed test -- results about the same as his cath 6 years ago. Patient verbalized understanding.  He is aware I will fax granted clearance today to [facility] office.

## 2014-04-28 NOTE — Telephone Encounter (Signed)
Clearance faxed to [phone] Attn: Venida Jarvis

## 2014-05-04 ENCOUNTER — Encounter: Payer: Self-pay | Admitting: Cardiology

## 2014-05-05 DIAGNOSIS — M1711 Unilateral primary osteoarthritis, right knee: Secondary | ICD-10-CM | POA: Diagnosis not present

## 2014-05-05 DIAGNOSIS — X58XXXA Exposure to other specified factors, initial encounter: Secondary | ICD-10-CM | POA: Diagnosis not present

## 2014-05-05 DIAGNOSIS — S83281A Other tear of lateral meniscus, current injury, right knee, initial encounter: Secondary | ICD-10-CM | POA: Diagnosis not present

## 2014-05-05 DIAGNOSIS — S83231A Complex tear of medial meniscus, current injury, right knee, initial encounter: Secondary | ICD-10-CM | POA: Diagnosis not present

## 2014-05-05 DIAGNOSIS — S83271A Complex tear of lateral meniscus, current injury, right knee, initial encounter: Secondary | ICD-10-CM | POA: Diagnosis not present

## 2014-05-05 DIAGNOSIS — Y929 Unspecified place or not applicable: Secondary | ICD-10-CM | POA: Diagnosis not present

## 2014-05-05 DIAGNOSIS — M659 Synovitis and tenosynovitis, unspecified: Secondary | ICD-10-CM | POA: Diagnosis not present

## 2014-05-08 ENCOUNTER — Telehealth: Payer: Self-pay | Admitting: *Deleted

## 2014-05-08 NOTE — Telephone Encounter (Signed)
I called pt to go over ok for surgery per Dr. Caryl Comes. Pt states he already had surgery and s/w someone in our office last week. I apologized to pt for my call today that it was not documentated on his results. Pt said ok and thank you.

## 2014-05-17 ENCOUNTER — Encounter: Payer: Self-pay | Admitting: Internal Medicine

## 2014-05-17 DIAGNOSIS — E291 Testicular hypofunction: Secondary | ICD-10-CM | POA: Diagnosis not present

## 2014-05-18 ENCOUNTER — Encounter: Payer: Self-pay | Admitting: Cardiology

## 2014-05-18 ENCOUNTER — Encounter: Payer: Self-pay | Admitting: Internal Medicine

## 2014-05-18 DIAGNOSIS — S83231D Complex tear of medial meniscus, current injury, right knee, subsequent encounter: Secondary | ICD-10-CM | POA: Diagnosis not present

## 2014-05-18 DIAGNOSIS — Z4789 Encounter for other orthopedic aftercare: Secondary | ICD-10-CM | POA: Diagnosis not present

## 2014-05-22 ENCOUNTER — Encounter: Payer: Medicare Other | Admitting: Internal Medicine

## 2014-05-23 ENCOUNTER — Encounter: Payer: Self-pay | Admitting: Internal Medicine

## 2014-05-23 DIAGNOSIS — N5201 Erectile dysfunction due to arterial insufficiency: Secondary | ICD-10-CM | POA: Diagnosis not present

## 2014-05-23 DIAGNOSIS — N486 Induration penis plastica: Secondary | ICD-10-CM | POA: Diagnosis not present

## 2014-05-29 DIAGNOSIS — S83231D Complex tear of medial meniscus, current injury, right knee, subsequent encounter: Secondary | ICD-10-CM | POA: Diagnosis not present

## 2014-05-30 ENCOUNTER — Ambulatory Visit: Payer: Medicare Other | Admitting: Internal Medicine

## 2014-05-31 DIAGNOSIS — E291 Testicular hypofunction: Secondary | ICD-10-CM | POA: Diagnosis not present

## 2014-06-01 DIAGNOSIS — S83231D Complex tear of medial meniscus, current injury, right knee, subsequent encounter: Secondary | ICD-10-CM | POA: Diagnosis not present

## 2014-06-05 DIAGNOSIS — S83231D Complex tear of medial meniscus, current injury, right knee, subsequent encounter: Secondary | ICD-10-CM | POA: Diagnosis not present

## 2014-06-08 ENCOUNTER — Ambulatory Visit (INDEPENDENT_AMBULATORY_CARE_PROVIDER_SITE_OTHER): Payer: Medicare Other | Admitting: Internal Medicine

## 2014-06-08 ENCOUNTER — Encounter: Payer: Self-pay | Admitting: Internal Medicine

## 2014-06-08 VITALS — BP 120/66 | HR 75 | Ht 72.0 in | Wt 231.8 lb

## 2014-06-08 DIAGNOSIS — Z8674 Personal history of sudden cardiac arrest: Secondary | ICD-10-CM | POA: Diagnosis not present

## 2014-06-08 DIAGNOSIS — Z4502 Encounter for adjustment and management of automatic implantable cardiac defibrillator: Secondary | ICD-10-CM | POA: Diagnosis not present

## 2014-06-08 DIAGNOSIS — I255 Ischemic cardiomyopathy: Secondary | ICD-10-CM

## 2014-06-08 DIAGNOSIS — S83231D Complex tear of medial meniscus, current injury, right knee, subsequent encounter: Secondary | ICD-10-CM | POA: Diagnosis not present

## 2014-06-08 LAB — MDC_IDC_ENUM_SESS_TYPE_INCLINIC
Battery Remaining Longevity: 28.8 mo
Battery Voltage: 2.56 V
Brady Statistic RV Percent Paced: 0 %
Date Time Interrogation Session: 20160421133346
HighPow Impedance: 52.2729
Implantable Pulse Generator Serial Number: 449388
Lead Channel Impedance Value: 475 Ohm
Lead Channel Pacing Threshold Amplitude: 1 V
Lead Channel Pacing Threshold Pulse Width: 0.4 ms
Lead Channel Sensing Intrinsic Amplitude: 11.9 mV
Lead Channel Setting Pacing Amplitude: 2.5 V
Lead Channel Setting Pacing Pulse Width: 0.4 ms
Lead Channel Setting Sensing Sensitivity: 0.3 mV
Zone Setting Detection Interval: 250 ms
Zone Setting Detection Interval: 300 ms
Zone Setting Detection Interval: 340 ms

## 2014-06-08 MED ORDER — ROSUVASTATIN CALCIUM 5 MG PO TABS: 5.0000 mg | ORAL_TABLET | Freq: Every day | ORAL | Status: DC

## 2014-06-08 MED ORDER — ROSUVASTATIN CALCIUM 5 MG PO TABS
5.0000 mg | ORAL_TABLET | Freq: Every day | ORAL | Status: DC
Start: 2014-06-08 — End: 2014-07-21

## 2014-06-08 NOTE — Progress Notes (Signed)
Patient Care Team: Tammi Sou, MD as PCP - General (Family Medicine) Milus Banister, MD as Consulting Physician (Gastroenterology) Linus Mako, MD as Consulting Physician (Family Medicine) Carolan Clines, MD as Consulting Physician (Urology) Con Memos as Consulting Physician (Pulmonary Disease)   HPI  [name] is a 79 y.o. male is seen in followup for aborted cardiac arrest in the setting of modest ischemic cardiomyopathy with prior bypass surgery and an ejection fraction of 40-45%. He is status post ICD implantation He has had no recurrent events. He has remote syncope.   The patient denies SOB, chest pain, or palpitations; Last catheterization 2008 patent grafts ejection fraction 40-45% with anteroapical scar  The patient denies chest pain, shortness of breath, nocturnal dyspnea, orthopnea or peripheral edema.  There have been no palpitations, lightheadedness or syncope.    Past Medical History  Diagnosis Date  . CAD (coronary artery disease)     a. MI/CABG 1996. b. VF arrest 2008: cath with severe native 3v disease, 3/3 grafts widely patent, mild to moderate left ventricular systolic dysfunction secondary to anteroapical scar. EF 40-45%.  . Cardiac arrest 2008    2008: aborted cardiac arrest 2008 (c/b encephalopathy, VDRF, aspiriation PNA, CHF, elevated LFTs), s/p St. Jude ICD placement.  . Ischemic cardiomyopathy     a. EF 40-45% by cath 2008.  . Diabetes mellitus, type 2 03/2011  . Disorder of bone and cartilage, unspecified   . Anemia, unspecified   . OSA on CPAP     sleep study 2008, severe OSA: CPAP 11 cm H2O  . Hypercholesterolemia   . GERD (gastroesophageal reflux disease)   . Hx of adenomatous colonic polyps 2003, 2013  . CKD (chronic kidney disease), stage III     CrCl @50ml /min  . BPH with obstruction/lower urinary tract symptoms   . Hypogonadism male     managed by [facility] (Dr. Gaynelle Arabian)  . Erectile dysfunction      "           "      "              "                  "    . Essential hypertension   . Chronic venous insufficiency   . Obesity   . Arthritis   . Meniscal injury Jan/Feb 2016    Sustained shoveling snow; Dr. Gladstone Lighter wants to do operative repair (cardiac clearance 04/19/14--they want to do a lexiscan stress test prior to clearance).    Past Surgical History  Procedure Laterality Date  . Coronary artery bypass graft  1996    3 vessel  . Pacemaker placement  07/2006    St Jude current VRRF  . Colonoscopy w/ polypectomy  2003    Adenomatous; repeat 2006 was recommended but this was not done until 06/10/2011 (by different GI MD)  . Colonoscopy  06/10/11    One diminutive polyp removed (tubular adenoma, no high grade dysplasia).  Repeat colonoscopy 5 yrs (Dr. Ardis Hughs).  . Polypectomy    . Cardiac defibrillator placement      Current Outpatient Prescriptions  Medication Sig Dispense Refill  . aspirin 81 MG tablet Take 1 tablet (81 mg total) by mouth daily. 30 tablet 6  . atorvastatin (LIPITOR) 20 MG tablet TAKE ONE TABLET BY MOUTH ONE TIME DAILY  90 tablet 1  . b complex vitamins tablet Take 1 tablet by mouth  daily.    . Calcium Carbonate (CALCIUM 500 PO) Take 1 tablet by mouth daily.    [name] HYDROcodone-acetaminophen (NORCO/VICODIN) 5-325 MG per tablet Take 1 tablet by mouth every 6 (six) hours as needed.     Javier Docker Oil 1000 MG CAPS Take by mouth.    [name] lisinopril (PRINIVIL,ZESTRIL) 5 MG tablet TAKE ONE TABLET BY MOUTH ONE TIME DAILY  90 tablet 3  . metoprolol tartrate (LOPRESSOR) 25 MG tablet Take 1 tablet (25 mg total) by mouth 2 (two) times daily. 180 tablet 1  . Multiple Vitamin (MULTIVITAMIN) tablet Take 1 tablet by mouth daily.    . NON FORMULARY OPC 3.  Take 1 tablet by mouth daily.    . NON FORMULARY GLUCOSTATIN. Once daily.    . pantoprazole (PROTONIX) 40 MG tablet TAKE ONE TABLET BY MOUTH ONE TIME DAILY  90 tablet 3  . sildenafil (REVATIO) 20 MG tablet Take 20 mg by mouth as needed.     . Testosterone (ANDROGEL PUMP) 20.25 MG/ACT (1.62%) GEL Place 60.75 mg onto the skin daily. (Distribute 3 pumps onto both shoulders and upper arms once every morning) 75 g 5  . VITAMIN D-VITAMIN K PO Take 1 tablet by mouth daily.     No current facility-administered medications for this visit.    Allergies  Allergen Reactions  . Naproxen Sodium Nausea Only    Review of Systems negative except from HPI and PMH  Physical Exam BP 120/66 mmHg  Pulse 75  Ht 6' (1.829 m)  Wt 231 lb 12.8 oz (105.144 kg)  BMI 31.43 kg/m2 Well developed and well nourished in no acute distress HENT normal E scleral and icterus clear Neck Supple JVP flat; carotids brisk and full Clear to ausculation  Regular rate and rhythm, no murmurs gallops or rub Soft with active bowel sounds No clubbing cyanosis  Edema Alert and oriented, grossly normal motor and sensory function Skin Warm and Dry  ECG  NSR 14/13/41 NSTT  Assessment and  Plan  Ischemic Heart Disease (KD32)  Syncope  ICD  St Jude The patient's device was interrogated.  The information was reviewed. No changes were made in the programming.    Obesity    No syncope  No ischemia  euvolemic  Will stop his lipitor and begin rosuvastatin as he has some disc out coupons

## 2014-06-08 NOTE — Patient Instructions (Signed)
Medication Instructions:  Your physician has recommended you make the following change in your medication:  1) STOP Lipitor 2) START Crestor 5 mg daily   Labwork: None  Testing/Procedures: None  Follow-Up: Remote monitoring is used to monitor your Pacemaker of ICD from home. This monitoring reduces the number of office visits required to check your device to one time per year. It allows [place] to keep an eye on the functioning of your device to ensure it is working properly. You are scheduled for a device check from home on 09/07/14. You may send your transmission at any time that day. If you have a wireless device, the transmission will be sent automatically. After your physician reviews your transmission, you will receive a postcard with your next transmission date.  Your physician wants you to follow-up in: 1 year with Dr. Caryl Comes.  You will receive a reminder letter in the mail two months in advance. If you don't receive a letter, please call our office to schedule the follow-up appointment.   Any Other Special Instructions Will Be Listed Below (If Applicable).

## 2014-06-09 ENCOUNTER — Other Ambulatory Visit: Payer: Self-pay

## 2014-06-12 DIAGNOSIS — H25013 Cortical age-related cataract, bilateral: Secondary | ICD-10-CM | POA: Diagnosis not present

## 2014-06-12 DIAGNOSIS — H25043 Posterior subcapsular polar age-related cataract, bilateral: Secondary | ICD-10-CM | POA: Diagnosis not present

## 2014-06-12 DIAGNOSIS — H2513 Age-related nuclear cataract, bilateral: Secondary | ICD-10-CM | POA: Diagnosis not present

## 2014-06-13 DIAGNOSIS — S83231D Complex tear of medial meniscus, current injury, right knee, subsequent encounter: Secondary | ICD-10-CM | POA: Diagnosis not present

## 2014-06-14 DIAGNOSIS — E291 Testicular hypofunction: Secondary | ICD-10-CM | POA: Diagnosis not present

## 2014-06-15 DIAGNOSIS — S83231D Complex tear of medial meniscus, current injury, right knee, subsequent encounter: Secondary | ICD-10-CM | POA: Diagnosis not present

## 2014-06-15 DIAGNOSIS — Z4789 Encounter for other orthopedic aftercare: Secondary | ICD-10-CM | POA: Diagnosis not present

## 2014-06-21 ENCOUNTER — Encounter: Payer: Self-pay | Admitting: Internal Medicine

## 2014-06-28 DIAGNOSIS — E291 Testicular hypofunction: Secondary | ICD-10-CM | POA: Diagnosis not present

## 2014-07-12 DIAGNOSIS — E291 Testicular hypofunction: Secondary | ICD-10-CM | POA: Diagnosis not present

## 2014-07-21 ENCOUNTER — Encounter: Payer: Self-pay | Admitting: Family Medicine

## 2014-07-21 ENCOUNTER — Ambulatory Visit (INDEPENDENT_AMBULATORY_CARE_PROVIDER_SITE_OTHER): Payer: Medicare Other | Admitting: Family Medicine

## 2014-07-21 VITALS — BP 118/60 | HR 76 | Temp 98.3°F | Resp 16 | Wt 229.0 lb

## 2014-07-21 DIAGNOSIS — E119 Type 2 diabetes mellitus without complications: Secondary | ICD-10-CM | POA: Diagnosis not present

## 2014-07-21 DIAGNOSIS — E785 Hyperlipidemia, unspecified: Secondary | ICD-10-CM

## 2014-07-21 DIAGNOSIS — I10 Essential (primary) hypertension: Secondary | ICD-10-CM | POA: Diagnosis not present

## 2014-07-21 DIAGNOSIS — I255 Ischemic cardiomyopathy: Secondary | ICD-10-CM | POA: Diagnosis not present

## 2014-07-21 LAB — LIPID PANEL
Cholesterol: 110 mg/dL (ref 0–200)
HDL: 36.2 mg/dL — ABNORMAL LOW (ref >39.00)
LDL Cholesterol: 60 mg/dL (ref 0–99)
NonHDL: 73.8
Total CHOL/HDL Ratio: 3
Triglycerides: 69 mg/dL (ref 0.0–149.0)
VLDL: 13.8 mg/dL (ref 0.0–40.0)

## 2014-07-21 LAB — COMPREHENSIVE METABOLIC PANEL
ALT: 18 U/L (ref 0–53)
AST: 17 U/L (ref 0–37)
Albumin: 4.2 g/dL (ref 3.5–5.2)
Alkaline Phosphatase: 43 U/L (ref 39–117)
BUN: 16 mg/dL (ref 6–23)
CO2: 30 mEq/L (ref 19–32)
Calcium: 9.2 mg/dL (ref 8.4–10.5)
Chloride: 102 mEq/L (ref 96–112)
Creatinine, Ser: 1.38 mg/dL (ref 0.40–1.50)
GFR: 53.18 mL/min — ABNORMAL LOW (ref >60.00)
Glucose, Bld: 123 mg/dL — ABNORMAL HIGH (ref 70–99)
Potassium: 4.8 mEq/L (ref 3.5–5.1)
Sodium: 136 mEq/L (ref 135–145)
Total Bilirubin: 0.8 mg/dL (ref 0.2–1.2)
Total Protein: 7 g/dL (ref 6.0–8.3)

## 2014-07-21 LAB — HEMOGLOBIN A1C: Hgb A1c MFr Bld: 6.5 % (ref 4.6–6.5)

## 2014-07-21 NOTE — Progress Notes (Signed)
OFFICE NOTE  07/21/2014  CC:  Chief Complaint  Patient presents with  . Follow-up    4 month f/u. Pt is fasting.   HPI: Patient is a 79 y.o. Caucasian male who is here for 4 mo f/u DM 2, HTN, hyperlipidemia, polypharmacy. No glucose monitoring at home. Recent eye exam showed no Diab retpthy. Home bp monitoring 120/70s consistently. Tolerating atorvastatin fine.  Had knee meniscus surgery 3 mo ago, says it is much improved but still has some soreness after activity so he's going back to Dr. Gladstone Lighter for another f/u to make sure all is ok.  He takes ibuprofen w/out prob.  ROS: no CP, no SOB, no dizziness, no fatigue,    Pertinent PMH:  Past medical, surgical, social, and family history reviewed and changes are noted since last office visit.  MEDS: Not taking testosterone listed below Outpatient Prescriptions Prior to Visit  Medication Sig Dispense Refill  . aspirin 81 MG tablet Take 1 tablet (81 mg total) by mouth daily. 30 tablet 6  . b complex vitamins tablet Take 1 tablet by mouth daily.    . Calcium Carbonate (CALCIUM 500 PO) Take 1 tablet by mouth daily.    Javier Docker Oil 1000 MG CAPS Take by mouth.    [name] lisinopril (PRINIVIL,ZESTRIL) 5 MG tablet TAKE ONE TABLET BY MOUTH ONE TIME DAILY  90 tablet 3  . metoprolol tartrate (LOPRESSOR) 25 MG tablet Take 1 tablet (25 mg total) by mouth 2 (two) times daily. 180 tablet 1  . Multiple Vitamin (MULTIVITAMIN) tablet Take 1 tablet by mouth daily.    . NON FORMULARY OPC 3.  Take 1 tablet by mouth daily.    . NON FORMULARY GLUCOSTATIN. Once daily.    . pantoprazole (PROTONIX) 40 MG tablet TAKE ONE TABLET BY MOUTH ONE TIME DAILY  90 tablet 3  . sildenafil (REVATIO) 20 MG tablet Take 20 mg by mouth as needed.    [name] HYDROcodone-acetaminophen (NORCO/VICODIN) 5-325 MG per tablet Take 1 tablet by mouth every 6 (six) hours as needed.     . rosuvastatin (CRESTOR) 5 MG tablet Take 1 tablet (5 mg total) by mouth daily. (Patient not taking: Reported on  07/21/2014) 90 tablet 3  . Testosterone (ANDROGEL PUMP) 20.25 MG/ACT (1.62%) GEL Place 60.75 mg onto the skin daily. (Distribute 3 pumps onto both shoulders and upper arms once every morning) (Patient not taking: Reported on 07/21/2014) 75 g 5  . VITAMIN D-VITAMIN K PO Take 1 tablet by mouth daily.     No facility-administered medications prior to visit.    PE: Blood pressure 118/60, pulse 76, temperature 98.3 F (36.8 C), temperature source Oral, resp. rate 16, weight 229 lb (103.874 kg), SpO2 96 %. Gen: Alert, well appearing.  Patient is oriented to person, place, time, and situation. AFFECT: pleasant, lucid thought and speech. No further exam today.  Lab Results  Component Value Date   HGBA1C 6.2 11/18/2013     Chemistry      Component Value Date/Time   NA 135 11/18/2013 1145   K 4.2 11/18/2013 1145   CL 103 11/18/2013 1145   CO2 26 11/18/2013 1145   BUN 21 11/18/2013 1145   CREATININE 1.5 11/18/2013 1145      Component Value Date/Time   CALCIUM 9.4 11/18/2013 1145   ALKPHOS 41 07/18/2013 0845   AST 24 07/18/2013 0845   ALT 27 07/18/2013 0845   BILITOT 0.9 07/18/2013 0845     Lab Results  Component Value Date  CHOL 108 07/18/2013   HDL 34.20* 07/18/2013   LDLCALC 64 07/18/2013   TRIG 49.0 07/18/2013   CHOLHDL 3 07/18/2013     IMPRESSION AND PLAN:  1) DM 2, good control with diet-only historically, no home gluc monitoring. HbA1c today.  Recent diab retpthy screening normal (3 wks ago).  2)HTN;The current medical regimen is effective;  continue present plan and medications. Lytes/cr today.  3) Hyperlipidemia; tolerating atorvastatin, LDL 64 1 yr ago. Recheck FLP and transaminases today.  An After Visit Summary was printed and given to the patient.  FOLLOW UP: 4 mo

## 2014-07-21 NOTE — Progress Notes (Signed)
Pre visit review using our clinic review tool, if applicable. No additional management support is needed unless otherwise documented below in the visit note. 

## 2014-07-24 ENCOUNTER — Encounter: Payer: Self-pay | Admitting: Family Medicine

## 2014-07-26 DIAGNOSIS — E291 Testicular hypofunction: Secondary | ICD-10-CM | POA: Diagnosis not present

## 2014-09-01 DIAGNOSIS — I8312 Varicose veins of left lower extremity with inflammation: Secondary | ICD-10-CM | POA: Diagnosis not present

## 2014-09-01 DIAGNOSIS — I8311 Varicose veins of right lower extremity with inflammation: Secondary | ICD-10-CM | POA: Diagnosis not present

## 2014-09-07 ENCOUNTER — Ambulatory Visit (INDEPENDENT_AMBULATORY_CARE_PROVIDER_SITE_OTHER): Payer: Medicare Other | Admitting: *Deleted

## 2014-09-07 DIAGNOSIS — I469 Cardiac arrest, cause unspecified: Secondary | ICD-10-CM | POA: Diagnosis not present

## 2014-09-07 NOTE — Progress Notes (Signed)
Remote ICD transmission.   

## 2014-09-23 LAB — CUP PACEART REMOTE DEVICE CHECK
Battery Remaining Longevity: 18 mo
Battery Voltage: 2.54 V
Brady Statistic RV Percent Paced: 1 %
Date Time Interrogation Session: 20160721063506
HighPow Impedance: 52 Ohm
Lead Channel Impedance Value: 440 Ohm
Lead Channel Pacing Threshold Amplitude: 1 V
Lead Channel Pacing Threshold Pulse Width: 0.4 ms
Lead Channel Sensing Intrinsic Amplitude: 10.7 mV
Lead Channel Setting Pacing Amplitude: 2.5 V
Lead Channel Setting Pacing Pulse Width: 0.4 ms
Lead Channel Setting Sensing Sensitivity: 0.3 mV
Pulse Gen Serial Number: 449388
Zone Setting Detection Interval: 250 ms
Zone Setting Detection Interval: 300 ms
Zone Setting Detection Interval: 340 ms

## 2014-09-26 DIAGNOSIS — I8311 Varicose veins of right lower extremity with inflammation: Secondary | ICD-10-CM | POA: Diagnosis not present

## 2014-09-26 DIAGNOSIS — I8312 Varicose veins of left lower extremity with inflammation: Secondary | ICD-10-CM | POA: Diagnosis not present

## 2014-09-28 ENCOUNTER — Other Ambulatory Visit: Payer: Self-pay | Admitting: *Deleted

## 2014-09-28 MED ORDER — ATORVASTATIN CALCIUM 20 MG PO TABS: 20.0000 mg | ORAL_TABLET | Freq: Every day | ORAL | Status: DC

## 2014-09-28 NOTE — Telephone Encounter (Signed)
RF request for atorvastatin LOV: 07/21/14 Next ov: 11/20/14 Last written: unknown

## 2014-09-29 DIAGNOSIS — E291 Testicular hypofunction: Secondary | ICD-10-CM | POA: Diagnosis not present

## 2014-09-29 DIAGNOSIS — I8312 Varicose veins of left lower extremity with inflammation: Secondary | ICD-10-CM | POA: Diagnosis not present

## 2014-09-29 DIAGNOSIS — I8311 Varicose veins of right lower extremity with inflammation: Secondary | ICD-10-CM | POA: Diagnosis not present

## 2014-10-04 ENCOUNTER — Encounter: Payer: Self-pay | Admitting: Cardiology

## 2014-10-09 ENCOUNTER — Encounter: Payer: Self-pay | Admitting: Internal Medicine

## 2014-10-18 ENCOUNTER — Encounter: Payer: Self-pay | Admitting: Cardiology

## 2014-10-25 DIAGNOSIS — N401 Enlarged prostate with lower urinary tract symptoms: Secondary | ICD-10-CM | POA: Diagnosis not present

## 2014-11-01 DIAGNOSIS — E291 Testicular hypofunction: Secondary | ICD-10-CM | POA: Diagnosis not present

## 2014-11-01 DIAGNOSIS — N401 Enlarged prostate with lower urinary tract symptoms: Secondary | ICD-10-CM | POA: Diagnosis not present

## 2014-11-01 DIAGNOSIS — N486 Induration penis plastica: Secondary | ICD-10-CM | POA: Diagnosis not present

## 2014-11-01 DIAGNOSIS — R3913 Splitting of urinary stream: Secondary | ICD-10-CM | POA: Diagnosis not present

## 2014-11-01 DIAGNOSIS — N5201 Erectile dysfunction due to arterial insufficiency: Secondary | ICD-10-CM | POA: Diagnosis not present

## 2014-11-02 DIAGNOSIS — E291 Testicular hypofunction: Secondary | ICD-10-CM | POA: Diagnosis not present

## 2014-11-08 DIAGNOSIS — E291 Testicular hypofunction: Secondary | ICD-10-CM | POA: Diagnosis not present

## 2014-11-20 ENCOUNTER — Ambulatory Visit: Payer: Medicare Other | Admitting: Family Medicine

## 2014-11-27 ENCOUNTER — Encounter: Payer: Self-pay | Admitting: Family Medicine

## 2014-11-27 ENCOUNTER — Ambulatory Visit (INDEPENDENT_AMBULATORY_CARE_PROVIDER_SITE_OTHER): Payer: Medicare Other | Admitting: Family Medicine

## 2014-11-27 VITALS — BP 124/72 | HR 62 | Temp 97.6°F | Resp 16 | Ht 72.0 in | Wt 229.0 lb

## 2014-11-27 DIAGNOSIS — I255 Ischemic cardiomyopathy: Secondary | ICD-10-CM | POA: Diagnosis not present

## 2014-11-27 DIAGNOSIS — E785 Hyperlipidemia, unspecified: Secondary | ICD-10-CM | POA: Diagnosis not present

## 2014-11-27 DIAGNOSIS — I10 Essential (primary) hypertension: Secondary | ICD-10-CM

## 2014-11-27 DIAGNOSIS — E119 Type 2 diabetes mellitus without complications: Secondary | ICD-10-CM

## 2014-11-27 DIAGNOSIS — Z23 Encounter for immunization: Secondary | ICD-10-CM

## 2014-11-27 NOTE — Progress Notes (Signed)
Pre visit review using our clinic review tool, if applicable. No additional management support is needed unless otherwise documented below in the visit note. 

## 2014-11-27 NOTE — Progress Notes (Signed)
OFFICE VISIT  11/27/2014   CC:  Chief Complaint  Patient presents with  . Follow-up    Pt is not fasting.    HPI:    Patient is a 79 y.o. Caucasian male who presents for 4 mo f/u diet-controlled DM, HTN, hyperlipidemia. Compliant with meds without side effects. Staying active.  Trying to eat diabetic diet/restrict calories. No home glucose or bp monitoring being done.  Denies cP, SOB, palpitations, or dizziness.  Has some mild on/off shoulder pain that he is thinking about going to see his orthopedist about if it doesn't get better.  Past Medical History  Diagnosis Date  . CAD (coronary artery disease)     a. MI/CABG 1996. b. VF arrest 2008: cath with severe native 3v disease, 3/3 grafts widely patent, mild to moderate left ventricular systolic dysfunction secondary to anteroapical scar. EF 40-45%.  . Cardiac arrest [facility]) 2008    2008: aborted cardiac arrest 2008 (c/b encephalopathy, VDRF, aspiriation PNA, CHF, elevated LFTs), s/p St. Jude ICD placement.  . Ischemic cardiomyopathy     a. EF 40-45% by cath 2008.  EF 39% by nuclear study 2016  . Diabetes mellitus, type 2 ([place]) 03/2011  . Disorder of bone and cartilage, unspecified   . Anemia, unspecified   . OSA on CPAP     sleep study 2008, severe OSA: CPAP 11 cm H2O  . Hypercholesterolemia   . GERD (gastroesophageal reflux disease)   . Hx of adenomatous colonic polyps 2003, 2013  . CKD (chronic kidney disease), stage III     CrCl @50ml /min  . BPH with obstruction/lower urinary tract symptoms   . Hypogonadism male     managed by [facility] (Dr. Gaynelle Arabian)  . Erectile dysfunction        "           "      "              "                  "    . Essential hypertension   . Chronic venous insufficiency   . Obesity   . Arthritis   . Meniscal injury Jan/Feb 2016    Sustained shoveling snow; repaired by Dr. Gladstone Lighter     Past Surgical History  Procedure Laterality Date  . Coronary artery bypass graft  1996    3  vessel  . Pacemaker placement  07/2006    St Jude current VRRF  . Colonoscopy w/ polypectomy  2003    Adenomatous; repeat 2006 was recommended but this was not done until 06/10/2011 (by different GI MD)  . Colonoscopy  06/10/11    One diminutive polyp removed (tubular adenoma, no high grade dysplasia).  Repeat colonoscopy 5 yrs (Dr. Ardis Hughs).  . Polypectomy    . Cardiac defibrillator placement    . Knee cartilage surgery  04/2014    Dr. Gladstone Lighter: torn meniscus repair  . Cardiovascular stress test  04/2014    High risk study: large scar, EF 39%, +WM abnl    Outpatient Prescriptions Prior to Visit  Medication Sig Dispense Refill  . aspirin 81 MG tablet Take 1 tablet (81 mg total) by mouth daily. 30 tablet 6  . atorvastatin (LIPITOR) 20 MG tablet Take 1 tablet (20 mg total) by mouth daily. 90 tablet 1  . b complex vitamins tablet Take 1 tablet by mouth daily.    . Calcium Carbonate (CALCIUM 500 PO) Take 1 tablet  by mouth daily.    Javier Docker Oil 1000 MG CAPS Take by mouth.    [name] lisinopril (PRINIVIL,ZESTRIL) 5 MG tablet TAKE ONE TABLET BY MOUTH ONE TIME DAILY  90 tablet 3  . metoprolol tartrate (LOPRESSOR) 25 MG tablet Take 1 tablet (25 mg total) by mouth 2 (two) times daily. 180 tablet 1  . Multiple Vitamin (MULTIVITAMIN) tablet Take 1 tablet by mouth daily.    . NON FORMULARY OPC 3.  Take 1 tablet by mouth daily.    . NON FORMULARY GLUCOSTATIN. Once daily.    . pantoprazole (PROTONIX) 40 MG tablet TAKE ONE TABLET BY MOUTH ONE TIME DAILY  90 tablet 3  . sildenafil (REVATIO) 20 MG tablet Take 20 mg by mouth as needed.     No facility-administered medications prior to visit.    Allergies  Allergen Reactions  . Naproxen Sodium Nausea Only    ROS As per HPI  PE: Blood pressure 124/72, pulse 62, temperature 97.6 F (36.4 C), temperature source Oral, resp. rate 16, height 6' (1.829 m), weight 229 lb (103.874 kg), SpO2 97 %. Gen: Alert, well appearing.  Patient is oriented to person, place,  time, and situation. CV: RRR, no m/r/g.   LUNGS: CTA bilat, nonlabored resps, good aeration in all lung fields. EXT: trace pitting edema in both LL's--vein stripping scars present and well healed.  LABS:  Lab Results  Component Value Date   HGBA1C 6.5 07/21/2014     Chemistry      Component Value Date/Time   NA 136 07/21/2014 0908   K 4.8 07/21/2014 0908   CL 102 07/21/2014 0908   CO2 30 07/21/2014 0908   BUN 16 07/21/2014 0908   CREATININE 1.38 07/21/2014 0908      Component Value Date/Time   CALCIUM 9.2 07/21/2014 0908   ALKPHOS 43 07/21/2014 0908   AST 17 07/21/2014 0908   ALT 18 07/21/2014 0908   BILITOT 0.8 07/21/2014 0908     Lab Results  Component Value Date   CHOL 110 07/21/2014   HDL 36.20* 07/21/2014   LDLCALC 60 07/21/2014   TRIG 69.0 07/21/2014   CHOLHDL 3 07/21/2014     IMPRESSION AND PLAN:  1) DM 2, diet controlled: The current medical regimen is effective;  continue present plan and medications. We'll do repeat HbA1c at next f/u in 4 mo.  2) HTN; The current medical regimen is effective;  continue present plan and medications.  3) Hyperlipidemia: The current medical regimen is effective;  continue present plan and medications.  Recheck FLP, CMET, and A1c at next f/u in 4 mo.  FOLLOW UP: Return in about 4 months (around 03/30/2015) for routine chronic illness f/u.

## 2014-12-11 ENCOUNTER — Telehealth: Payer: Self-pay | Admitting: Cardiology

## 2014-12-11 ENCOUNTER — Ambulatory Visit (INDEPENDENT_AMBULATORY_CARE_PROVIDER_SITE_OTHER): Payer: Medicare Other | Admitting: *Deleted

## 2014-12-11 DIAGNOSIS — I469 Cardiac arrest, cause unspecified: Secondary | ICD-10-CM

## 2014-12-11 NOTE — Telephone Encounter (Signed)
Spoke with pt and reminded pt of remote transmission that is due today. Pt verbalized understanding.   

## 2014-12-11 NOTE — Progress Notes (Signed)
Remote ICD transmission.   

## 2014-12-12 LAB — CUP PACEART REMOTE DEVICE CHECK
Battery Remaining Longevity: 11 mo
Battery Voltage: 2.53 V
Brady Statistic RV Percent Paced: 1 %
Date Time Interrogation Session: 20161024182700
HighPow Impedance: 53 Ohm
Implantable Lead Implant Date: 20080624
Implantable Lead Location: 753860
Implantable Lead Model: 7121
Lead Channel Impedance Value: 440 Ohm
Lead Channel Pacing Threshold Amplitude: 1 V
Lead Channel Pacing Threshold Pulse Width: 0.4 ms
Lead Channel Sensing Intrinsic Amplitude: 10.1 mV
Lead Channel Setting Pacing Amplitude: 2.5 V
Lead Channel Setting Pacing Pulse Width: 0.4 ms
Lead Channel Setting Sensing Sensitivity: 0.3 mV
Pulse Gen Serial Number: 449388

## 2014-12-13 ENCOUNTER — Encounter: Payer: Self-pay | Admitting: Cardiology

## 2014-12-14 DIAGNOSIS — E291 Testicular hypofunction: Secondary | ICD-10-CM | POA: Diagnosis not present

## 2014-12-27 ENCOUNTER — Encounter: Payer: Self-pay | Admitting: Cardiology

## 2015-01-18 DIAGNOSIS — E291 Testicular hypofunction: Secondary | ICD-10-CM | POA: Diagnosis not present

## 2015-02-20 DIAGNOSIS — E291 Testicular hypofunction: Secondary | ICD-10-CM | POA: Diagnosis not present

## 2015-02-20 DIAGNOSIS — N5201 Erectile dysfunction due to arterial insufficiency: Secondary | ICD-10-CM | POA: Diagnosis not present

## 2015-03-12 ENCOUNTER — Ambulatory Visit (INDEPENDENT_AMBULATORY_CARE_PROVIDER_SITE_OTHER): Payer: Medicare Other | Admitting: *Deleted

## 2015-03-12 DIAGNOSIS — I469 Cardiac arrest, cause unspecified: Secondary | ICD-10-CM

## 2015-03-13 NOTE — Progress Notes (Signed)
Remote ICD transmission.   

## 2015-03-15 LAB — CUP PACEART REMOTE DEVICE CHECK
Battery Remaining Longevity: 1 mo
Battery Voltage: 2.47 V
Brady Statistic RV Percent Paced: 1 %
Date Time Interrogation Session: 20170123144340
HighPow Impedance: 49 Ohm
HighPow Impedance: 52 Ohm
Implantable Lead Implant Date: 20080624
Implantable Lead Location: 753860
Implantable Lead Model: 7121
Lead Channel Impedance Value: 440 Ohm
Lead Channel Setting Pacing Amplitude: 2.5 V
Lead Channel Setting Pacing Pulse Width: 0.4 ms
Lead Channel Setting Sensing Sensitivity: 0.3 mV
Pulse Gen Serial Number: 449388

## 2015-03-21 ENCOUNTER — Encounter: Payer: Self-pay | Admitting: Cardiology

## 2015-03-27 DIAGNOSIS — E291 Testicular hypofunction: Secondary | ICD-10-CM | POA: Diagnosis not present

## 2015-03-30 ENCOUNTER — Encounter: Payer: Self-pay | Admitting: Family Medicine

## 2015-03-30 ENCOUNTER — Ambulatory Visit (INDEPENDENT_AMBULATORY_CARE_PROVIDER_SITE_OTHER): Payer: Medicare Other | Admitting: Family Medicine

## 2015-03-30 VITALS — BP 137/78 | HR 72 | Temp 98.2°F | Resp 16 | Ht 72.0 in | Wt 236.5 lb

## 2015-03-30 DIAGNOSIS — N189 Chronic kidney disease, unspecified: Secondary | ICD-10-CM

## 2015-03-30 DIAGNOSIS — I872 Venous insufficiency (chronic) (peripheral): Secondary | ICD-10-CM

## 2015-03-30 DIAGNOSIS — E785 Hyperlipidemia, unspecified: Secondary | ICD-10-CM

## 2015-03-30 DIAGNOSIS — E119 Type 2 diabetes mellitus without complications: Secondary | ICD-10-CM

## 2015-03-30 DIAGNOSIS — N183 Chronic kidney disease, stage 3 unspecified: Secondary | ICD-10-CM

## 2015-03-30 DIAGNOSIS — I10 Essential (primary) hypertension: Secondary | ICD-10-CM

## 2015-03-30 LAB — LIPID PANEL
Cholesterol: 127 mg/dL (ref 0–200)
HDL: 42.4 mg/dL (ref >39.00)
LDL Cholesterol: 68 mg/dL (ref 0–99)
NonHDL: 84.17
Total CHOL/HDL Ratio: 3
Triglycerides: 79 mg/dL (ref 0.0–149.0)
VLDL: 15.8 mg/dL (ref 0.0–40.0)

## 2015-03-30 LAB — BASIC METABOLIC PANEL
BUN: 20 mg/dL (ref 6–23)
CO2: 30 mEq/L (ref 19–32)
Calcium: 9.7 mg/dL (ref 8.4–10.5)
Chloride: 104 mEq/L (ref 96–112)
Creatinine, Ser: 1.42 mg/dL (ref 0.40–1.50)
GFR: 51.36 mL/min — ABNORMAL LOW (ref >60.00)
Glucose, Bld: 126 mg/dL — ABNORMAL HIGH (ref 70–99)
Potassium: 4.8 mEq/L (ref 3.5–5.1)
Sodium: 139 mEq/L (ref 135–145)

## 2015-03-30 LAB — HEMOGLOBIN A1C: Hgb A1c MFr Bld: 6.7 % — ABNORMAL HIGH (ref 4.6–6.5)

## 2015-03-30 MED ORDER — METOPROLOL TARTRATE 25 MG PO TABS: 25.0000 mg | ORAL_TABLET | Freq: Two times a day (BID) | ORAL | Status: DC

## 2015-03-30 MED ORDER — ATORVASTATIN CALCIUM 20 MG PO TABS: 20.0000 mg | ORAL_TABLET | Freq: Every day | ORAL | Status: DC

## 2015-03-30 MED ORDER — PANTOPRAZOLE SODIUM 40 MG PO TBEC: 40.0000 mg | DELAYED_RELEASE_TABLET | Freq: Every day | ORAL | Status: DC

## 2015-03-30 MED ORDER — LISINOPRIL 5 MG PO TABS: 5.0000 mg | ORAL_TABLET | Freq: Every day | ORAL | Status: DC

## 2015-03-30 NOTE — Progress Notes (Signed)
Pre visit review using our clinic review tool, if applicable. No additional management support is needed unless otherwise documented below in the visit note. 

## 2015-03-30 NOTE — Progress Notes (Signed)
OFFICE VISIT  03/30/2015   CC:  Chief Complaint  Patient presents with  . Follow-up    Pt is fasting.    HPI:    Patient is a 80 y.o. Caucasian male who presents for 4 mo f/u DM 2, HTN, HLD, and CRI stage III. Says he feels great.  No home glucose or bp monitoring.  Trying to eat diabetic diet best he can. Exercise: walks the dog twice a day.  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   Past Medical History  Diagnosis Date  . CAD (coronary artery disease)     a. MI/CABG 1996. b. VF arrest 2008: cath with severe native 3v disease, 3/3 grafts widely patent, mild to moderate left ventricular systolic dysfunction secondary to anteroapical scar. EF 40-45%.  . Cardiac arrest [facility]) 2008    2008: aborted cardiac arrest 2008 (c/b encephalopathy, VDRF, aspiriation PNA, CHF, elevated LFTs), s/p St. Jude ICD placement.  . Ischemic cardiomyopathy     a. EF 40-45% by cath 2008.  EF 39% by nuclear study 2016  . Diabetes mellitus, type 2 ([place]) 03/2011  . Disorder of bone and cartilage, unspecified   . Anemia, unspecified   . OSA on CPAP     sleep study 2008, severe OSA: CPAP 11 cm H2O  . Hypercholesterolemia   . GERD (gastroesophageal reflux disease)   . Hx of adenomatous colonic polyps 2003, 2013  . CKD (chronic kidney disease), stage III     CrCl @50ml /min  . BPH with obstruction/lower urinary tract symptoms   . Hypogonadism male     managed by [facility] (Dr. Gaynelle Arabian)  . Erectile dysfunction        "           "      "              "                  "    . Essential hypertension   . Chronic venous insufficiency   . Obesity   . Arthritis   . Meniscal injury Jan/Feb 2016    Sustained shoveling snow; repaired by Dr. Gladstone Lighter     Past Surgical History  Procedure Laterality Date  . Coronary artery bypass graft  1996    3 vessel  . Pacemaker placement  07/2006    St Jude current VRRF  . Colonoscopy w/ polypectomy  2003    Adenomatous; repeat  2006 was recommended but this was not done until 06/10/2011 (by different GI MD)  . Colonoscopy  06/10/11    One diminutive polyp removed (tubular adenoma, no high grade dysplasia).  Repeat colonoscopy 5 yrs (Dr. Ardis Hughs).  . Polypectomy    . Cardiac defibrillator placement    . Knee cartilage surgery  04/2014    Dr. Gladstone Lighter: torn meniscus repair  . Cardiovascular stress test  04/2014    High risk study: large scar, EF 39%, +WM abnl    Outpatient Prescriptions Prior to Visit  Medication Sig Dispense Refill  . aspirin 81 MG tablet Take 1 tablet (81 mg total) by mouth daily. 30 tablet 6  . b complex vitamins tablet Take 1 tablet by mouth daily.    . Calcium Carbonate (CALCIUM 500 PO) Take 1 tablet by mouth daily.    Javier Docker Oil 1000 MG CAPS Take by mouth.    . Multiple Vitamin (MULTIVITAMIN) tablet Take 1 tablet  by mouth daily.    . NON FORMULARY OPC 3.  Take 1 tablet by mouth daily.    . NON FORMULARY GLUCOSTATIN. Once daily.    . sildenafil (REVATIO) 20 MG tablet Take 20 mg by mouth as needed.    . testosterone cypionate (DEPOTESTOTERONE CYPIONATE) 100 MG/ML injection Inject into the muscle every 28 (twenty-eight) days. For IM use only    . atorvastatin (LIPITOR) 20 MG tablet Take 1 tablet (20 mg total) by mouth daily. 90 tablet 1  . lisinopril (PRINIVIL,ZESTRIL) 5 MG tablet TAKE ONE TABLET BY MOUTH ONE TIME DAILY  90 tablet 3  . metoprolol tartrate (LOPRESSOR) 25 MG tablet Take 1 tablet (25 mg total) by mouth 2 (two) times daily. 180 tablet 1  . pantoprazole (PROTONIX) 40 MG tablet TAKE ONE TABLET BY MOUTH ONE TIME DAILY  90 tablet 3   No facility-administered medications prior to visit.    Allergies  Allergen Reactions  . Naproxen Sodium Nausea Only   ROS As per HPI  PE: Blood pressure 137/78, pulse 72, temperature 98.2 F (36.8 C), temperature source Oral, resp. rate 16, height 6' (1.829 m), weight 236 lb 8 oz (107.276 kg), SpO2 91 %. Gen: Alert, well appearing.  Patient is  oriented to person, place, time, and situation. CV: RRR, no m/r/g.   LUNGS: CTA bilat, nonlabored resps, good aeration in all lung fields. EXT: trace bilat pitting edema from mid tibia level down into ankles, R>L.  LABS:  Lab Results  Component Value Date   TSH 4.20 03/19/2010   Lab Results  Component Value Date   WBC 6.8 07/06/2012   HGB 14.5 07/06/2012   HCT 44.0 07/06/2012   MCV 90.6 07/06/2012   PLT 179.0 07/06/2012   Lab Results  Component Value Date   CREATININE 1.38 07/21/2014   BUN 16 07/21/2014   NA 136 07/21/2014   K 4.8 07/21/2014   CL 102 07/21/2014   CO2 30 07/21/2014   Lab Results  Component Value Date   ALT 18 07/21/2014   AST 17 07/21/2014   ALKPHOS 43 07/21/2014   BILITOT 0.8 07/21/2014   Lab Results  Component Value Date   CHOL 110 07/21/2014   Lab Results  Component Value Date   HDL 36.20* 07/21/2014   Lab Results  Component Value Date   LDLCALC 60 07/21/2014   Lab Results  Component Value Date   TRIG 69.0 07/21/2014   Lab Results  Component Value Date   CHOLHDL 3 07/21/2014   Lab Results  Component Value Date   PSA 0.34 12/18/2011   PSA 0.43 03/24/2011   PSA 0.34 03/19/2010   Lab Results  Component Value Date   HGBA1C 6.5 07/21/2014   IMPRESSION AND PLAN:  1) DM 2; diet controlled, hx of good control. HbA1c today.  2) hyperlipidemia: tolerating statin.  Recheck FLP today.  Transaminases good 07/2014.  3) LE venous insufficiency: improved s/p vein procedures with vein clinic.  4) CRI stage III:  Lytes/cr today.  5) HTN; The current medical regimen is effective;  continue present plan and medications. Lytes/cr today.  An After Visit Summary was printed and given to the patient.  FOLLOW UP: Return in about 4 months (around 07/28/2015) for routine chronic illness f/u (30 min).

## 2015-04-02 ENCOUNTER — Telehealth: Payer: Self-pay | Admitting: Cardiology

## 2015-04-02 NOTE — Telephone Encounter (Signed)
Spoke w/ pt and informed him that his device has reached ERI. Explained to pt that once his device reaches this it means he has approximately 3 months left of battery life. A scheduler will call him and schedule an appt w/ MD / PA / NP to come into office to talk about procedure and the procedure will be scheduled for a later date. Pt verbalized understanding.

## 2015-04-11 ENCOUNTER — Encounter: Payer: Self-pay | Admitting: *Deleted

## 2015-04-11 ENCOUNTER — Other Ambulatory Visit: Payer: Self-pay | Admitting: Physician Assistant

## 2015-04-11 ENCOUNTER — Ambulatory Visit (INDEPENDENT_AMBULATORY_CARE_PROVIDER_SITE_OTHER): Payer: Medicare Other | Admitting: Physician Assistant

## 2015-04-11 ENCOUNTER — Encounter: Payer: Self-pay | Admitting: Physician Assistant

## 2015-04-11 VITALS — BP 126/62 | HR 69 | Ht 72.0 in | Wt 243.0 lb

## 2015-04-11 DIAGNOSIS — I10 Essential (primary) hypertension: Secondary | ICD-10-CM | POA: Diagnosis not present

## 2015-04-11 DIAGNOSIS — I2510 Atherosclerotic heart disease of native coronary artery without angina pectoris: Secondary | ICD-10-CM

## 2015-04-11 DIAGNOSIS — I255 Ischemic cardiomyopathy: Secondary | ICD-10-CM | POA: Diagnosis not present

## 2015-04-11 NOTE — Progress Notes (Signed)
Cardiology Office Note Date:  04/11/2015  Patient ID:  [name], [name] [birth year], MRN [id number] PCP:  Tammi Sou, MD  Cardiologist/cardiologist: Dr. Caryl Comes   Chief Complaint:  ICD has reached ERI  History of Present Illness: [name] is a 80 y.o. male with history of CAD/CABG, ICM, cardiac arrest, DM, HTN, HLD, OSA reports compliant with CPAP, CRI, chronic veinous insufficiency and remote syncope.[name]    He comes to the office today being seen for Dr. Caryl Comes, who saw him last April 2016 and was doing well at that time.  He comes in today feeling very well, denies nay kind of CP, palpitations or SOB, denies exertional intolerances, no shocks from his device, no dizziness, near syncope or syncope, denies symptoms of PND or orthopnea.   Past Medical History  Diagnosis Date  . CAD (coronary artery disease)     a. MI/CABG 1996. b. VF arrest 2008: cath with severe native 3v disease, 3/3 grafts widely patent, mild to moderate left ventricular systolic dysfunction secondary to anteroapical scar. EF 40-45%.  . Cardiac arrest [facility]) 2008    2008: aborted cardiac arrest 2008 (c/b encephalopathy, VDRF, aspiriation PNA, CHF, elevated LFTs), s/p St. Jude ICD placement.  . Ischemic cardiomyopathy     a. EF 40-45% by cath 2008.  EF 39% by nuclear study 2016  . Diabetes mellitus, type 2 ([place]) 03/2011  . Disorder of bone and cartilage, unspecified   . Anemia, unspecified   . OSA on CPAP     sleep study 2008, severe OSA: CPAP 11 cm H2O  . Hypercholesterolemia   . GERD (gastroesophageal reflux disease)   . Hx of adenomatous colonic polyps 2003, 2013  . CKD (chronic kidney disease), stage III     CrCl @50ml /min  . BPH with obstruction/lower urinary tract symptoms   . Hypogonadism male     managed by [facility] (Dr. Gaynelle Arabian)  . Erectile dysfunction        "           "      "              "                  "    . Essential hypertension   . Chronic venous insufficiency     . Obesity   . Arthritis   . Meniscal injury Jan/Feb 2016    Sustained shoveling snow; repaired by Dr. Gladstone Lighter     Past Surgical History  Procedure Laterality Date  . Coronary artery bypass graft  1996    3 vessel  . Pacemaker placement  07/2006    St Jude current VRRF  . Colonoscopy w/ polypectomy  2003    Adenomatous; repeat 2006 was recommended but this was not done until 06/10/2011 (by different GI MD)  . Colonoscopy  06/10/11    One diminutive polyp removed (tubular adenoma, no high grade dysplasia).  Repeat colonoscopy 5 yrs (Dr. Ardis Hughs).  . Polypectomy    . Cardiac defibrillator placement    . Knee cartilage surgery  04/2014    Dr. Gladstone Lighter: torn meniscus repair  . Cardiovascular stress test  04/2014    High risk study: large scar, EF 39%, +WM abnl    Current Outpatient Prescriptions  Medication Sig Dispense Refill  . aspirin 81 MG tablet Take 1 tablet (81 mg total) by mouth daily. 30 tablet 6  . atorvastatin (LIPITOR) 20 MG tablet Take 1 tablet (20 mg  total) by mouth daily. 90 tablet 3  . b complex vitamins tablet Take 1 tablet by mouth daily.    Javier Docker Oil 1000 MG CAPS Take by mouth.    [name] lisinopril (PRINIVIL,ZESTRIL) 5 MG tablet Take 1 tablet (5 mg total) by mouth daily. 90 tablet 3  . metoprolol tartrate (LOPRESSOR) 25 MG tablet Take 1 tablet (25 mg total) by mouth 2 (two) times daily. 180 tablet 3  . Multiple Vitamin (MULTIVITAMIN) tablet Take 1 tablet by mouth daily.    . NON FORMULARY OPC 3.  Take 1 tablet by mouth daily.    . NON FORMULARY GLUCOSTATIN. Once daily.    . pantoprazole (PROTONIX) 40 MG tablet Take 1 tablet (40 mg total) by mouth daily. 90 tablet 3  . sildenafil (REVATIO) 20 MG tablet Take 20 mg by mouth as needed.    . testosterone cypionate (DEPOTESTOTERONE CYPIONATE) 100 MG/ML injection Inject into the muscle every 28 (twenty-eight) days. For IM use only     No current facility-administered medications for this visit.    Allergies:   Naproxen sodium    Social History:  The patient  reports that he quit smoking about 45 years ago. His smoking use included Cigarettes. He has never used smokeless tobacco. He reports that he drinks about 4.2 oz of alcohol per week. He reports that he does not use illicit drugs.   Family History:  The patient's family history includes Heart disease in his father and mother; Stroke in his mother. There is no history of Colon cancer, Esophageal cancer, Stomach cancer, or Rectal cancer.  ROS:  Please see the history of present illness.  All other systems are reviewed and otherwise negative.   PHYSICAL EXAM:  VS:  BP 126/62 mmHg  Pulse 69  Ht 6' (1.829 m)  Wt 243 lb (110.224 kg)  BMI 32.95 kg/m2 BMI: Body mass index is 32.95 kg/(m^2). Well nourished, well developed, in no acute distress HEENT: normocephalic, atraumatic Neck: no JVD, carotid bruits or masses Cardiac:  normal S1, S2; RRR; no significant murmurs, no rubs, or gallops Lungs:  clear to auscultation bilaterally, no wheezing, rhonchi or rales Abd: soft, nontender MS: no deformity or atrophy Ext: trace if any edema, chronic looking skin changes b/l Skin: warm and dry, no rash Neuro:  No gross deficits appreciated Psych: euthymic mood, full affect  ICD site is stable, no tethering or discomfort   EKG:  Done today shows SR, IVCD, appears similar to previous ICD interrogation today: ICD has rached ERI, normal device function, no episodes/therapies, see pace art  04/25/14: Lexiscan stress test Overall Impression: High risk stress nuclear study with a large scar in the mid and apical anterior, true apex, basal and mid anteroseptal, inferoseptal and apical lateral walls consistent with a large scar in the entire LAD and partially in the RCA territory with peri-infarct ischemia in the PDA territory (SDS 3). . LV Ejection Fraction: 39%. LV Wall Motion: Akinesis of the anterior and septal walls with apical dyskinesis.   07/24/06:  Echocardiogram SUMMARY - The images are extremely limited. The septum is akinetic. The    posterior wall moves. I can not assess further. The left    ventricle was mildly dilated. This study was inadequate for    the evaluation of left ventricular regional wall motion. - Left atrial size was at the upper limits of normal.   Recent Labs: 07/21/2014: ALT 18 03/30/2015: BUN 20; Creatinine, Ser 1.42; Potassium 4.8; Sodium 139  03/30/2015: Cholesterol  127; HDL 42.40; LDL Cholesterol 68; Total CHOL/HDL Ratio 3; Triglycerides 79.0; VLDL 15.8   Estimated Creatinine Clearance: 55.8 mL/min (by C-G formula based on Cr of 1.42).   Wt Readings from Last 3 Encounters:  04/11/15 243 lb (110.224 kg)  03/30/15 236 lb 8 oz (107.276 kg)  11/27/14 229 lb (103.874 kg)     Other studies reviewed: Additional studies/records reviewed today include: summarized above  DEVICE information: STJ single lead ICD, implanted 08/11/06 by Dr. Lovena Le  ASSESSMENT AND PLAN:  1. ICM, hx of cardiac arrest     On BB/ACE tx     appears well compensated     ICD reached ERI on 04/02/15     Procedure, risks/benefits discussed with the patient and he wishes to proceed     plan for ICD generator change     Resume Merlin remotes post gen change  2. CAD     on ASA statin, BB     Peri-infarct ischemia on last stress test      no angina  3. HTN     Appears well controlled  4. HLD     Well controlled  Disposition: Schedule ICD generator change, pre-procedure labs, wound check appt 7-10 days post procedure, and 68mo follow up visit  Current medicines are reviewed at length with the patient today.  The patient did not have any concerns regarding medicines.  Haywood Lasso, PA-C 04/11/2015 4:05 PM     [place]   [phone] (office)  [phone] (fax)

## 2015-04-11 NOTE — Patient Instructions (Addendum)
Medication Instructions:   Your physician recommends that you continue on your current medications as directed. Please refer to the Current Medication list given to you today.   If you need a refill on your cardiac medications before your next appointment, please call your pharmacy.  Labwork:  CBC BMET AND PT/INR RETURN ON 04/23/15    Testing/Procedures:  SEE LETTER FOR GEN CHANGE ON 04/27/15   Follow-Up:  10 DAYS AFTER 04/27/15 FOR WOUND CHECK WITH DEVICE CLINIC  91 DAYS AFTER 04/27/15   FOR PHYS DEFIB CHK WITH DR  Caryl Comes   Any Other Special Instructions Will Be Listed Below (If Applicable).

## 2015-04-16 ENCOUNTER — Encounter: Payer: Self-pay | Admitting: Family Medicine

## 2015-04-17 ENCOUNTER — Encounter: Payer: Self-pay | Admitting: Internal Medicine

## 2015-04-23 ENCOUNTER — Other Ambulatory Visit (INDEPENDENT_AMBULATORY_CARE_PROVIDER_SITE_OTHER): Payer: Medicare Other | Admitting: *Deleted

## 2015-04-23 DIAGNOSIS — I2589 Other forms of chronic ischemic heart disease: Secondary | ICD-10-CM | POA: Diagnosis not present

## 2015-04-23 DIAGNOSIS — I255 Ischemic cardiomyopathy: Secondary | ICD-10-CM | POA: Diagnosis not present

## 2015-04-23 LAB — BASIC METABOLIC PANEL
BUN: 17 mg/dL (ref 7–25)
CO2: 26 mmol/L (ref 20–31)
Calcium: 9 mg/dL (ref 8.6–10.3)
Chloride: 105 mmol/L (ref 98–110)
Creat: 1.34 mg/dL — ABNORMAL HIGH (ref 0.70–1.18)
Glucose, Bld: 109 mg/dL — ABNORMAL HIGH (ref 65–99)
Potassium: 4.5 mmol/L (ref 3.5–5.3)
Sodium: 140 mmol/L (ref 135–146)

## 2015-04-23 LAB — PROTIME-INR
INR: 1.03 (ref <1.50)
Prothrombin Time: 13.6 seconds (ref 11.6–15.2)

## 2015-04-23 LAB — CBC
HCT: 43 % (ref 39.0–52.0)
Hemoglobin: 15.1 g/dL (ref 13.0–17.0)
MCH: 33.1 pg (ref 26.0–34.0)
MCHC: 35.1 g/dL (ref 30.0–36.0)
MCV: 94.3 fL (ref 78.0–100.0)
MPV: 10.3 fL (ref 8.6–12.4)
Platelets: 179 10*3/uL (ref 150–400)
RBC: 4.56 MIL/uL (ref 4.22–5.81)
RDW: 14 % (ref 11.5–15.5)
WBC: 4.6 10*3/uL (ref 4.0–10.5)

## 2015-04-23 NOTE — Addendum Note (Signed)
Addended by: Eulis Foster on: 04/23/2015 03:12 PM   Modules accepted: Orders

## 2015-04-26 ENCOUNTER — Telehealth: Payer: Self-pay | Admitting: *Deleted

## 2015-04-26 DIAGNOSIS — E291 Testicular hypofunction: Secondary | ICD-10-CM | POA: Diagnosis not present

## 2015-04-26 NOTE — Telephone Encounter (Signed)
-----   Message from [facility], [place] sent at 04/24/2015  5:00 PM EST ----- Please let the patient know his labs look OK for his procedure on Friday.  Thanks Tommye Standard, PA-C

## 2015-04-26 NOTE — Telephone Encounter (Signed)
-----   Message from [facility], [place] sent at 04/24/2015  5:00 PM EST ----- Please let the patient know his labs look OK for his procedure on Friday.  Thanks Tommye Standard, PA-C

## 2015-04-27 ENCOUNTER — Ambulatory Visit (HOSPITAL_COMMUNITY)
Admission: RE | Admit: 2015-04-27 | Discharge: 2015-04-27 | Disposition: A | Discharge status: Home / Self Care | Payer: Medicare Other | Source: Ambulatory Visit | Attending: Internal Medicine | Admitting: Internal Medicine

## 2015-04-27 ENCOUNTER — Encounter (HOSPITAL_COMMUNITY)
Admission: RE | Disposition: A | Discharge status: Home / Self Care | Payer: Self-pay | Source: Ambulatory Visit | Attending: Internal Medicine

## 2015-04-27 DIAGNOSIS — D649 Anemia, unspecified: Secondary | ICD-10-CM | POA: Diagnosis not present

## 2015-04-27 DIAGNOSIS — Z6832 Body mass index (BMI) 32.0-32.9, adult: Secondary | ICD-10-CM | POA: Insufficient documentation

## 2015-04-27 DIAGNOSIS — Z951 Presence of aortocoronary bypass graft: Secondary | ICD-10-CM | POA: Insufficient documentation

## 2015-04-27 DIAGNOSIS — M1990 Unspecified osteoarthritis, unspecified site: Secondary | ICD-10-CM | POA: Diagnosis not present

## 2015-04-27 DIAGNOSIS — Z7982 Long term (current) use of aspirin: Secondary | ICD-10-CM | POA: Diagnosis not present

## 2015-04-27 DIAGNOSIS — E669 Obesity, unspecified: Secondary | ICD-10-CM | POA: Diagnosis not present

## 2015-04-27 DIAGNOSIS — G4733 Obstructive sleep apnea (adult) (pediatric): Secondary | ICD-10-CM | POA: Insufficient documentation

## 2015-04-27 DIAGNOSIS — Z86010 Personal history of colonic polyps: Secondary | ICD-10-CM | POA: Diagnosis not present

## 2015-04-27 DIAGNOSIS — Z87891 Personal history of nicotine dependence: Secondary | ICD-10-CM | POA: Insufficient documentation

## 2015-04-27 DIAGNOSIS — K219 Gastro-esophageal reflux disease without esophagitis: Secondary | ICD-10-CM | POA: Insufficient documentation

## 2015-04-27 DIAGNOSIS — I2510 Atherosclerotic heart disease of native coronary artery without angina pectoris: Secondary | ICD-10-CM | POA: Diagnosis present

## 2015-04-27 DIAGNOSIS — E7800 Pure hypercholesterolemia, unspecified: Secondary | ICD-10-CM | POA: Insufficient documentation

## 2015-04-27 DIAGNOSIS — I252 Old myocardial infarction: Secondary | ICD-10-CM | POA: Diagnosis not present

## 2015-04-27 DIAGNOSIS — N183 Chronic kidney disease, stage 3 (moderate): Secondary | ICD-10-CM | POA: Insufficient documentation

## 2015-04-27 DIAGNOSIS — Z4502 Encounter for adjustment and management of automatic implantable cardiac defibrillator: Secondary | ICD-10-CM | POA: Diagnosis not present

## 2015-04-27 DIAGNOSIS — I872 Venous insufficiency (chronic) (peripheral): Secondary | ICD-10-CM | POA: Insufficient documentation

## 2015-04-27 DIAGNOSIS — I469 Cardiac arrest, cause unspecified: Secondary | ICD-10-CM | POA: Diagnosis present

## 2015-04-27 DIAGNOSIS — I129 Hypertensive chronic kidney disease with stage 1 through stage 4 chronic kidney disease, or unspecified chronic kidney disease: Secondary | ICD-10-CM | POA: Diagnosis not present

## 2015-04-27 DIAGNOSIS — I255 Ischemic cardiomyopathy: Secondary | ICD-10-CM | POA: Insufficient documentation

## 2015-04-27 DIAGNOSIS — E1122 Type 2 diabetes mellitus with diabetic chronic kidney disease: Secondary | ICD-10-CM | POA: Insufficient documentation

## 2015-04-27 DIAGNOSIS — Z95810 Presence of automatic (implantable) cardiac defibrillator: Secondary | ICD-10-CM | POA: Diagnosis present

## 2015-04-27 HISTORY — PX: EP IMPLANTABLE DEVICE: SHX172B

## 2015-04-27 LAB — SURGICAL PCR SCREEN
MRSA, PCR: NEGATIVE
Staphylococcus aureus: NEGATIVE

## 2015-04-27 SURGERY — ICD/BIV ICD GENERATOR CHANGEOUT
Anesthesia: LOCAL

## 2015-04-27 MED ORDER — ACETAMINOPHEN 325 MG PO TABS
325.0000 mg | ORAL_TABLET | ORAL | Status: DC | PRN
  Filled 2015-04-27: qty 2

## 2015-04-27 MED ORDER — FENTANYL CITRATE (PF) 100 MCG/2ML IJ SOLN
INTRAMUSCULAR | Status: AC
  Filled 2015-04-27: qty 2

## 2015-04-27 MED ORDER — MIDAZOLAM HCL 5 MG/5ML IJ SOLN
INTRAMUSCULAR | Status: DC | PRN
  Administered 2015-04-27: 1 mg via INTRAVENOUS
  Administered 2015-04-27: 2 mg via INTRAVENOUS
  Administered 2015-04-27: 1 mg via INTRAVENOUS

## 2015-04-27 MED ORDER — MIDAZOLAM HCL 5 MG/5ML IJ SOLN
INTRAMUSCULAR | Status: AC
  Filled 2015-04-27: qty 5

## 2015-04-27 MED ORDER — LIDOCAINE HCL (PF) 1 % IJ SOLN
INTRAMUSCULAR | Status: AC
  Filled 2015-04-27: qty 60

## 2015-04-27 MED ORDER — SODIUM CHLORIDE 0.9 % IV SOLN
INTRAVENOUS | Status: DC
  Administered 2015-04-27: 13:00:00 via INTRAVENOUS

## 2015-04-27 MED ORDER — SODIUM CHLORIDE 0.9 % IV SOLN: INTRAVENOUS | Status: AC

## 2015-04-27 MED ORDER — LIDOCAINE HCL (PF) 1 % IJ SOLN
INTRAMUSCULAR | Status: DC | PRN
  Administered 2015-04-27: 50 mL

## 2015-04-27 MED ORDER — SODIUM CHLORIDE 0.9 % IR SOLN
Status: AC
  Filled 2015-04-27: qty 2

## 2015-04-27 MED ORDER — FENTANYL CITRATE (PF) 100 MCG/2ML IJ SOLN
INTRAMUSCULAR | Status: DC | PRN
  Administered 2015-04-27: 50 ug via INTRAVENOUS
  Administered 2015-04-27 (×2): 25 ug via INTRAVENOUS

## 2015-04-27 MED ORDER — SODIUM CHLORIDE 0.9 % IR SOLN
80.0000 mg | Status: AC
  Administered 2015-04-27: 80 mg

## 2015-04-27 MED ORDER — GENTAMICIN SULFATE 40 MG/ML IJ SOLN
INTRAMUSCULAR | Status: AC
  Filled 2015-04-27: qty 2

## 2015-04-27 MED ORDER — MUPIROCIN 2 % EX OINT
TOPICAL_OINTMENT | CUTANEOUS | Status: AC
  Filled 2015-04-27: qty 22

## 2015-04-27 MED ORDER — CEFAZOLIN SODIUM-DEXTROSE 2-3 GM-% IV SOLR: 2.0000 g | INTRAVENOUS | Status: DC

## 2015-04-27 MED ORDER — CEFAZOLIN SODIUM-DEXTROSE 2-3 GM-% IV SOLR
INTRAVENOUS | Status: DC | PRN
  Administered 2015-04-27: 2 g via INTRAVENOUS

## 2015-04-27 MED ORDER — CEFAZOLIN SODIUM-DEXTROSE 2-3 GM-% IV SOLR
INTRAVENOUS | Status: AC
  Filled 2015-04-27: qty 50

## 2015-04-27 MED ORDER — CHLORHEXIDINE GLUCONATE 4 % EX LIQD: 60.0000 mL | Freq: Once | CUTANEOUS | Status: DC

## 2015-04-27 MED ORDER — MUPIROCIN 2 % EX OINT
TOPICAL_OINTMENT | Freq: Two times a day (BID) | CUTANEOUS | Status: DC
  Filled 2015-04-27: qty 22

## 2015-04-27 MED ORDER — ONDANSETRON HCL 4 MG/2ML IJ SOLN: 4.0000 mg | Freq: Four times a day (QID) | INTRAMUSCULAR | Status: DC | PRN

## 2015-04-27 SURGICAL SUPPLY — 6 items
CABLE SURGICAL S-101-97-12 (CABLE) ×2 IMPLANT
HEMOSTAT SURGICEL 2X4 FIBR (HEMOSTASIS) ×2 IMPLANT
ICD ELLIPSE VR CD1411-36C (ICD Generator) ×2 IMPLANT
PAD DEFIB LIFELINK (PAD) ×2 IMPLANT
TOOL LEAD REMOVAL 4080 (MISCELLANEOUS) ×2 IMPLANT
TRAY PACEMAKER INSERTION (PACKS) ×2 IMPLANT

## 2015-04-27 NOTE — H&P (View-Only) (Signed)
Cardiology Office Note Date:  04/11/2015  Patient ID:  [name], [name] [name] August 07, 1935, MRN [id number] PCP:  Tammi Sou, MD  Cardiologist/cardiologist: Dr. Caryl Comes   Chief Complaint:  ICD has reached ERI  History of Present Illness: [name] [name] [name] is a 80 y.o. male with history of CAD/CABG, ICM, cardiac arrest, DM, HTN, HLD, OSA reports compliant with CPAP, CRI, chronic veinous insufficiency and remote syncope.[name]    He comes to the office today being seen for Dr. Caryl Comes, who saw him last April 2016 and was doing well at that time.  He comes in today feeling very well, denies nay kind of CP, palpitations or SOB, denies exertional intolerances, no shocks from his device, no dizziness, near syncope or syncope, denies symptoms of PND or orthopnea.   Past Medical History  Diagnosis Date  . CAD (coronary artery disease)     a. MI/CABG 1996. b. VF arrest 2008: cath with severe native 3v disease, 3/3 grafts widely patent, mild to moderate left ventricular systolic dysfunction secondary to anteroapical scar. EF 40-45%.  . Cardiac arrest [facility]) 2008    2008: aborted cardiac arrest 2008 (c/b encephalopathy, VDRF, aspiriation PNA, CHF, elevated LFTs), s/p St. Jude ICD placement.  . Ischemic cardiomyopathy     a. EF 40-45% by cath 2008.  EF 39% by nuclear study 2016  . Diabetes mellitus, type 2 ([place]) 03/2011  . Disorder of bone and cartilage, unspecified   . Anemia, unspecified   . OSA on CPAP     sleep study 2008, severe OSA: CPAP 11 cm H2O  . Hypercholesterolemia   . GERD (gastroesophageal reflux disease)   . Hx of adenomatous colonic polyps 2003, 2013  . CKD (chronic kidney disease), stage III     CrCl @50ml /min  . BPH with obstruction/lower urinary tract symptoms   . Hypogonadism male     managed by [facility] (Dr. Gaynelle Arabian)  . Erectile dysfunction        "           "      "              "                  "    . Essential hypertension   . Chronic venous insufficiency     . Obesity   . Arthritis   . Meniscal injury Jan/Feb 2016    Sustained shoveling snow; repaired by Dr. Gladstone Lighter     Past Surgical History  Procedure Laterality Date  . Coronary artery bypass graft  1996    3 vessel  . Pacemaker placement  07/2006    St Jude current VRRF  . Colonoscopy w/ polypectomy  2003    Adenomatous; repeat 2006 was recommended but this was not done until 06/10/2011 (by different GI MD)  . Colonoscopy  06/10/11    One diminutive polyp removed (tubular adenoma, no high grade dysplasia).  Repeat colonoscopy 5 yrs (Dr. Ardis Hughs).  . Polypectomy    . Cardiac defibrillator placement    . Knee cartilage surgery  04/2014    Dr. Gladstone Lighter: torn meniscus repair  . Cardiovascular stress test  04/2014    High risk study: large scar, EF 39%, +WM abnl    Current Outpatient Prescriptions  Medication Sig Dispense Refill  . aspirin 81 MG tablet Take 1 tablet (81 mg total) by mouth daily. 30 tablet 6  . atorvastatin (LIPITOR) 20 MG tablet Take 1 tablet (20 mg  total) by mouth daily. 90 tablet 3  . b complex vitamins tablet Take 1 tablet by mouth daily.    Javier Docker Oil 1000 MG CAPS Take by mouth.    [name] lisinopril (PRINIVIL,ZESTRIL) 5 MG tablet Take 1 tablet (5 mg total) by mouth daily. 90 tablet 3  . metoprolol tartrate (LOPRESSOR) 25 MG tablet Take 1 tablet (25 mg total) by mouth 2 (two) times daily. 180 tablet 3  . Multiple Vitamin (MULTIVITAMIN) tablet Take 1 tablet by mouth daily.    . NON FORMULARY OPC 3.  Take 1 tablet by mouth daily.    . NON FORMULARY GLUCOSTATIN. Once daily.    . pantoprazole (PROTONIX) 40 MG tablet Take 1 tablet (40 mg total) by mouth daily. 90 tablet 3  . sildenafil (REVATIO) 20 MG tablet Take 20 mg by mouth as needed.    . testosterone cypionate (DEPOTESTOTERONE CYPIONATE) 100 MG/ML injection Inject into the muscle every 28 (twenty-eight) days. For IM use only     No current facility-administered medications for this visit.    Allergies:   Naproxen sodium    Social History:  The patient  reports that he quit smoking about 45 years ago. His smoking use included Cigarettes. He has never used smokeless tobacco. He reports that he drinks about 4.2 oz of alcohol per week. He reports that he does not use illicit drugs.   Family History:  The patient's family history includes Heart disease in his father and mother; Stroke in his mother. There is no history of Colon cancer, Esophageal cancer, Stomach cancer, or Rectal cancer.  ROS:  Please see the history of present illness.  All other systems are reviewed and otherwise negative.   PHYSICAL EXAM:  VS:  BP 126/62 mmHg  Pulse 69  Ht 6' (1.829 m)  Wt 243 lb (110.224 kg)  BMI 32.95 kg/m2 BMI: Body mass index is 32.95 kg/(m^2). Well nourished, well developed, in no acute distress HEENT: normocephalic, atraumatic Neck: no JVD, carotid bruits or masses Cardiac:  normal S1, S2; RRR; no significant murmurs, no rubs, or gallops Lungs:  clear to auscultation bilaterally, no wheezing, rhonchi or rales Abd: soft, nontender MS: no deformity or atrophy Ext: trace if any edema, chronic looking skin changes b/l Skin: warm and dry, no rash Neuro:  No gross deficits appreciated Psych: euthymic mood, full affect  ICD site is stable, no tethering or discomfort   EKG:  Done today shows SR, IVCD, appears similar to previous ICD interrogation today: ICD has rached ERI, normal device function, no episodes/therapies, see pace art  04/25/14: Lexiscan stress test Overall Impression: High risk stress nuclear study with a large scar in the mid and apical anterior, true apex, basal and mid anteroseptal, inferoseptal and apical lateral walls consistent with a large scar in the entire LAD and partially in the RCA territory with peri-infarct ischemia in the PDA territory (SDS 3). . LV Ejection Fraction: 39%. LV Wall Motion: Akinesis of the anterior and septal walls with apical dyskinesis.   07/24/06:  Echocardiogram SUMMARY - The images are extremely limited. The septum is akinetic. The    posterior wall moves. I can not assess further. The left    ventricle was mildly dilated. This study was inadequate for    the evaluation of left ventricular regional wall motion. - Left atrial size was at the upper limits of normal.   Recent Labs: 07/21/2014: ALT 18 03/30/2015: BUN 20; Creatinine, Ser 1.42; Potassium 4.8; Sodium 139  03/30/2015: Cholesterol  127; HDL 42.40; LDL Cholesterol 68; Total CHOL/HDL Ratio 3; Triglycerides 79.0; VLDL 15.8   Estimated Creatinine Clearance: 55.8 mL/min (by C-G formula based on Cr of 1.42).   Wt Readings from Last 3 Encounters:  04/11/15 243 lb (110.224 kg)  03/30/15 236 lb 8 oz (107.276 kg)  11/27/14 229 lb (103.874 kg)     Other studies reviewed: Additional studies/records reviewed today include: summarized above  DEVICE information: STJ single lead ICD, implanted 08/11/06 by Dr. Lovena Le  ASSESSMENT AND PLAN:  1. ICM, hx of cardiac arrest     On BB/ACE tx     appears well compensated     ICD reached ERI on 04/02/15     Procedure, risks/benefits discussed with the patient and he wishes to proceed     plan for ICD generator change     Resume Merlin remotes post gen change  2. CAD     on ASA statin, BB     Peri-infarct ischemia on last stress test      no angina  3. HTN     Appears well controlled  4. HLD     Well controlled  Disposition: Schedule ICD generator change, pre-procedure labs, wound check appt 7-10 days post procedure, and 47mo follow up visit  Current medicines are reviewed at length with the patient today.  The patient did not have any concerns regarding medicines.  Haywood Lasso, PA-C 04/11/2015 4:05 PM     [address] [place] [address] [phone] (office)  [phone] (fax)

## 2015-04-27 NOTE — Interval H&P Note (Signed)
ICD Criteria  Current LVEF:39%. Within 12 months prior to implant: Yes   Heart failure history: Yes, Class I  Cardiomyopathy history: Yes, Ischemic Cardiomyopathy.  Atrial Fibrillation/Atrial Flutter: No.  Ventricular tachycardia history: Yes, Hemodynamic instability present. VT Type: Sustained Ventricular Tachycardia - Polymorphic.  Cardiac arrest history: Yes, Ventricular Fibrillation.  History of syndromes with risk of sudden death: No.  Previous ICD: Yes, Reason for ICD:  Secondary prevention.  Current ICD indication: Secondary  PPM indication: No.   Class I or II Bradycardia indication present: No  Beta Blocker therapy for 3 or more months: Yes, prescribed.   Ace Inhibitor/ARB therapy for 3 or more months: Yes, prescribed.   History and Physical Interval Note:  04/27/2015 3:44 PM  [name]  has presented today for surgery, with the diagnosis of ERI  The various methods of treatment have been discussed with the patient and family. After consideration of risks, benefits and other options for treatment, the patient has consented to  Procedure(s): ICD [employer] (N/A) as a surgical intervention .  The patient's history has been reviewed, patient examined, no change in status, stable for surgery.  I have reviewed the patient's chart and labs.  Questions were answered to the patient's satisfaction.     Virl Axe

## 2015-04-27 NOTE — Discharge Instructions (Signed)

## 2015-04-30 ENCOUNTER — Encounter (HOSPITAL_COMMUNITY): Payer: Self-pay | Admitting: Internal Medicine

## 2015-04-30 MED FILL — Sodium Chloride Irrigation Soln 0.9%: Qty: 500 | Status: AC

## 2015-04-30 MED FILL — Gentamicin Sulfate Inj 40 MG/ML: INTRAMUSCULAR | Qty: 2 | Status: AC

## 2015-05-07 ENCOUNTER — Ambulatory Visit (INDEPENDENT_AMBULATORY_CARE_PROVIDER_SITE_OTHER): Payer: Medicare Other | Admitting: *Deleted

## 2015-05-07 ENCOUNTER — Encounter: Payer: Self-pay | Admitting: Internal Medicine

## 2015-05-07 DIAGNOSIS — I255 Ischemic cardiomyopathy: Secondary | ICD-10-CM

## 2015-05-07 LAB — CUP PACEART INCLINIC DEVICE CHECK
Battery Remaining Longevity: 99.6
Brady Statistic RV Percent Paced: 0 %
Date Time Interrogation Session: 20170320161724
HighPow Impedance: 52.1611
Implantable Lead Implant Date: 20080624
Implantable Lead Location: 753860
Implantable Lead Model: 7121
Lead Channel Impedance Value: 475 Ohm
Lead Channel Pacing Threshold Amplitude: 0.75 V
Lead Channel Pacing Threshold Amplitude: 0.75 V
Lead Channel Pacing Threshold Pulse Width: 0.5 ms
Lead Channel Pacing Threshold Pulse Width: 0.5 ms
Lead Channel Sensing Intrinsic Amplitude: 11.3 mV
Lead Channel Setting Pacing Amplitude: 2.5 V
Lead Channel Setting Pacing Pulse Width: 0.5 ms
Lead Channel Setting Sensing Sensitivity: 0.5 mV
Pulse Gen Serial Number: 7310877

## 2015-05-07 NOTE — Progress Notes (Signed)
Wound check appointment. Dermabond removed. Wound without redness or edema. Incision edges approximated, wound well healed. Normal device function. Threshold, sensing, and impedances consistent with implant measurements. Device programmed at appropriate safety margins. Histogram distribution appropriate for patient and level of activity. No ventricular arrhythmias noted. Patient educated about wound care, arm mobility, and shock plan. ROV in 3 months with SK.

## 2015-05-24 DIAGNOSIS — E291 Testicular hypofunction: Secondary | ICD-10-CM | POA: Diagnosis not present

## 2015-06-04 DIAGNOSIS — M25562 Pain in left knee: Secondary | ICD-10-CM | POA: Diagnosis not present

## 2015-06-05 DIAGNOSIS — H25013 Cortical age-related cataract, bilateral: Secondary | ICD-10-CM | POA: Diagnosis not present

## 2015-06-05 DIAGNOSIS — H1859 Other hereditary corneal dystrophies: Secondary | ICD-10-CM | POA: Diagnosis not present

## 2015-06-05 DIAGNOSIS — H25043 Posterior subcapsular polar age-related cataract, bilateral: Secondary | ICD-10-CM | POA: Diagnosis not present

## 2015-06-05 DIAGNOSIS — H16223 Keratoconjunctivitis sicca, not specified as Sjogren's, bilateral: Secondary | ICD-10-CM | POA: Diagnosis not present

## 2015-06-05 LAB — HM DIABETES EYE EXAM

## 2015-06-08 ENCOUNTER — Other Ambulatory Visit: Payer: Self-pay | Admitting: Orthopedic Surgery

## 2015-06-08 DIAGNOSIS — M25562 Pain in left knee: Secondary | ICD-10-CM

## 2015-06-12 ENCOUNTER — Encounter: Payer: Medicare Other | Admitting: Internal Medicine

## 2015-06-14 ENCOUNTER — Ambulatory Visit
Admission: RE | Admit: 2015-06-14 | Discharge: 2015-06-14 | Disposition: A | Discharge status: Home / Self Care | Payer: Medicare Other | Source: Ambulatory Visit | Attending: Orthopedic Surgery | Admitting: Orthopedic Surgery

## 2015-06-14 DIAGNOSIS — M25562 Pain in left knee: Secondary | ICD-10-CM | POA: Diagnosis not present

## 2015-06-14 DIAGNOSIS — S83242A Other tear of medial meniscus, current injury, left knee, initial encounter: Secondary | ICD-10-CM | POA: Diagnosis not present

## 2015-06-14 MED ORDER — IOPAMIDOL (ISOVUE-M 200) INJECTION 41%
40.0000 mL | Freq: Once | INTRAMUSCULAR | Status: AC
  Administered 2015-06-14: 40 mL via INTRA_ARTICULAR

## 2015-06-18 DIAGNOSIS — S83242A Other tear of medial meniscus, current injury, left knee, initial encounter: Secondary | ICD-10-CM | POA: Diagnosis not present

## 2015-06-21 DIAGNOSIS — E291 Testicular hypofunction: Secondary | ICD-10-CM | POA: Diagnosis not present

## 2015-07-25 DIAGNOSIS — E291 Testicular hypofunction: Secondary | ICD-10-CM | POA: Diagnosis not present

## 2015-07-30 DIAGNOSIS — S83242A Other tear of medial meniscus, current injury, left knee, initial encounter: Secondary | ICD-10-CM | POA: Diagnosis not present

## 2015-07-31 ENCOUNTER — Encounter: Payer: Self-pay | Admitting: Family Medicine

## 2015-07-31 ENCOUNTER — Ambulatory Visit (INDEPENDENT_AMBULATORY_CARE_PROVIDER_SITE_OTHER): Payer: Medicare Other | Admitting: Family Medicine

## 2015-07-31 VITALS — BP 118/70 | HR 68 | Temp 97.9°F | Resp 16 | Ht 72.0 in | Wt 234.5 lb

## 2015-07-31 DIAGNOSIS — N189 Chronic kidney disease, unspecified: Secondary | ICD-10-CM | POA: Diagnosis not present

## 2015-07-31 DIAGNOSIS — I255 Ischemic cardiomyopathy: Secondary | ICD-10-CM | POA: Diagnosis not present

## 2015-07-31 DIAGNOSIS — E119 Type 2 diabetes mellitus without complications: Secondary | ICD-10-CM

## 2015-07-31 DIAGNOSIS — I10 Essential (primary) hypertension: Secondary | ICD-10-CM | POA: Diagnosis not present

## 2015-07-31 DIAGNOSIS — N183 Chronic kidney disease, stage 3 unspecified: Secondary | ICD-10-CM

## 2015-07-31 DIAGNOSIS — E785 Hyperlipidemia, unspecified: Secondary | ICD-10-CM | POA: Diagnosis not present

## 2015-07-31 LAB — COMPREHENSIVE METABOLIC PANEL
ALT: 28 U/L (ref 0–53)
AST: 25 U/L (ref 0–37)
Albumin: 4.6 g/dL (ref 3.5–5.2)
Alkaline Phosphatase: 43 U/L (ref 39–117)
BUN: 21 mg/dL (ref 6–23)
CO2: 28 mEq/L (ref 19–32)
Calcium: 9.7 mg/dL (ref 8.4–10.5)
Chloride: 100 mEq/L (ref 96–112)
Creatinine, Ser: 1.46 mg/dL (ref 0.40–1.50)
GFR: 49.69 mL/min — ABNORMAL LOW (ref >60.00)
Glucose, Bld: 142 mg/dL — ABNORMAL HIGH (ref 70–99)
Potassium: 4.8 mEq/L (ref 3.5–5.1)
Sodium: 137 mEq/L (ref 135–145)
Total Bilirubin: 0.7 mg/dL (ref 0.2–1.2)
Total Protein: 7.5 g/dL (ref 6.0–8.3)

## 2015-07-31 LAB — HEMOGLOBIN A1C: Hgb A1c MFr Bld: 7 % — ABNORMAL HIGH (ref 4.6–6.5)

## 2015-07-31 NOTE — Progress Notes (Signed)
OFFICE VISIT  07/31/2015   CC:  Chief Complaint  Patient presents with  . Follow-up    Pt is not fasting.    HPI:    Patient is a 80 y.o. Caucasian male who presents for 4 mo f/u diet-controlled DM 2, HTN, HLD, and CRI stage III.  Recently started prednisone taper last night for L knee osteoarthritis with recent suspected meniscus injury superimposed on this recently.  There is plan for arthroscopic surgery with Dr. Gladstone Lighter 09/2015. Other than this he feels terrific.  No home glucose monitoring, eating diabetic diet.   No burning, tingling, or numbness in feet. Home bp: no monitoring b/c it has always been so good in the past. HLD: tolerating statin, due for routine AST/ALT monitoring today.  Past Medical History  Diagnosis Date  . CAD (coronary artery disease)     a. MI/CABG 1996. b. VF arrest 2008: cath with severe native 3v disease, 3/3 grafts widely patent, mild to moderate left ventricular systolic dysfunction secondary to anteroapical scar. EF 40-45%.  . Cardiac arrest [facility]) 2008    2008: aborted cardiac arrest 2008 (c/b encephalopathy, VDRF, aspiriation PNA, CHF, elevated LFTs), s/p St. Jude ICD placement.  . Ischemic cardiomyopathy     a. EF 40-45% by cath 2008.  EF 39% by nuclear study 2016  . Diabetes mellitus, type 2 ([place]) 03/2011  . Disorder of bone and cartilage, unspecified   . Anemia, unspecified   . OSA on CPAP     sleep study 2008, severe OSA: CPAP 11 cm H2O  . Hypercholesterolemia   . GERD (gastroesophageal reflux disease)   . Hx of adenomatous colonic polyps 2003, 2013  . CKD (chronic kidney disease), stage III     CrCl @50ml /min  . BPH with obstruction/lower urinary tract symptoms   . Hypogonadism male     managed by [facility] (Dr. Gaynelle Arabian)  . Erectile dysfunction        "           "      "              "                  "    . Essential hypertension   . Chronic venous insufficiency   . Obesity   . Arthritis   . Meniscal injury Jan/Feb  2016    Sustained shoveling snow; repaired by Dr. Gladstone Lighter     Past Surgical History  Procedure Laterality Date  . Coronary artery bypass graft  1996    3 vessel  . Pacemaker placement  07/2006    St Jude current VRRF--generator change 03/2015  . Colonoscopy w/ polypectomy  2003    Adenomatous; repeat 2006 was recommended but this was not done until 06/10/2011 (by different GI MD)  . Colonoscopy  06/10/11    One diminutive polyp removed (tubular adenoma, no high grade dysplasia).  Repeat colonoscopy 5 yrs (Dr. Ardis Hughs).  . Polypectomy    . Cardiac defibrillator placement    . Knee cartilage surgery  04/2014    Dr. Gladstone Lighter: torn meniscus repair  . Cardiovascular stress test  04/2014    High risk study: large scar, EF 39%, +WM abnl  . Ep implantable device N/A 04/27/2015    Procedure: ICD Generator Changeout;  Surgeon: Deboraha Sprang, MD;  Location: [facility];  Service: Cardiovascular;  Laterality: N/A;    Outpatient Prescriptions Prior to Visit  Medication Sig  Dispense Refill  . acetaminophen (TYLENOL) 650 MG CR tablet Take 1,300 mg by mouth every 8 (eight) hours as needed for pain (Pt takes med each morning).    [name] aspirin EC 81 MG tablet Take 81 mg by mouth daily.    [name] atorvastatin (LIPITOR) 20 MG tablet Take 1 tablet (20 mg total) by mouth daily. 90 tablet 3  . b complex vitamins tablet Take 1 tablet by mouth daily.    . Glucos-Chondroit-Hyaluron-MSM (GLUCOSAMINE CHONDROITIN JOINT) TABS Take 1 tablet by mouth daily.    [name] KRILL OIL OMEGA-3 PO Take 1 capsule by mouth daily.    [name] lisinopril (PRINIVIL,ZESTRIL) 5 MG tablet Take 1 tablet (5 mg total) by mouth daily. 90 tablet 3  . metoprolol tartrate (LOPRESSOR) 25 MG tablet Take 1 tablet (25 mg total) by mouth 2 (two) times daily. (Patient taking differently: Take 50 mg by mouth daily. ) 180 tablet 3  . Multiple Vitamin (MULTIVITAMIN) tablet Take 1 tablet by mouth daily.    . NON FORMULARY Take 1 tablet by mouth daily. OPC 3.   antioxidant protection    . pantoprazole (PROTONIX) 40 MG tablet Take 1 tablet (40 mg total) by mouth daily. 90 tablet 3  . sildenafil (REVATIO) 20 MG tablet Take 20 mg by mouth as needed.    . testosterone cypionate (DEPOTESTOTERONE CYPIONATE) 100 MG/ML injection Inject into the muscle every 28 (twenty-eight) days. For IM use only     No facility-administered medications prior to visit.    Allergies  Allergen Reactions  . Naproxen Sodium Nausea Only    ROS As per HPI  PE: Blood pressure 118/70, pulse 68, temperature 97.9 F (36.6 C), temperature source Oral, resp. rate 16, height 6' (1.829 m), weight 234 lb 8 oz (106.369 kg), SpO2 94 %. Gen: Alert, well appearing.  Patient is oriented to person, place, time, and situation. AFFECT: pleasant, lucid thought and speech. Foot exam - no swelling, tenderness or skin or vascular lesions. Color and temperature is normal. Sensation is intact. Peripheral pulses are palpable. Toenails are normal.  LABS:  None today  IMPRESSION AND PLAN:  1) DM 2, diet controlled--historically well controlled. HbA1c check today. Feet exam normal today. Eye exam is UTD--most recent was 1 mo ago and no DR detected per pt report--we'll get record.  2) HTN; The current medical regimen is effective;  continue present plan and medications. Lytes/cr today.  3) HLD: tolerating statin.  Lipids good 4 mo ago.  Monitor AST/ALT today--last check was 1 yr ago.  4) CRI stage III: BMET today.  5) Osteoarthritis: Knees and L shoulder. His his orthopedist put him on steroid taper last night and he plans on getting L knee arthroscopic surgery in a couple of months.  An After Visit Summary was printed and given to the patient.  FOLLOW UP: Return in about 4 months (around 11/30/2015) for annual CPE (fasting).  Signed:  Crissie Sickles, MD           07/31/2015

## 2015-07-31 NOTE — Progress Notes (Signed)
Pre visit review using our clinic review tool, if applicable. No additional management support is needed unless otherwise documented below in the visit note. 

## 2015-08-01 ENCOUNTER — Encounter: Payer: Self-pay | Admitting: Family Medicine

## 2015-08-02 ENCOUNTER — Ambulatory Visit (INDEPENDENT_AMBULATORY_CARE_PROVIDER_SITE_OTHER): Payer: Medicare Other | Admitting: Internal Medicine

## 2015-08-02 ENCOUNTER — Encounter: Payer: Self-pay | Admitting: Internal Medicine

## 2015-08-02 VITALS — BP 126/72 | HR 73 | Ht 72.0 in | Wt 232.0 lb

## 2015-08-02 DIAGNOSIS — Z4502 Encounter for adjustment and management of automatic implantable cardiac defibrillator: Secondary | ICD-10-CM

## 2015-08-02 DIAGNOSIS — I255 Ischemic cardiomyopathy: Secondary | ICD-10-CM

## 2015-08-02 DIAGNOSIS — I472 Ventricular tachycardia, unspecified: Secondary | ICD-10-CM

## 2015-08-02 LAB — CUP PACEART INCLINIC DEVICE CHECK
Date Time Interrogation Session: 20170615170734
Implantable Lead Implant Date: 20080624
Implantable Lead Location: 753860
Implantable Lead Model: 7121
Pulse Gen Serial Number: 7310877

## 2015-08-02 NOTE — Progress Notes (Signed)
Patient Care Team: Tammi Sou, MD as PCP - General (Family Medicine) Milus Banister, MD as Consulting Physician (Gastroenterology) Linus Mako, MD as Consulting Physician (Family Medicine) Carolan Clines, MD as Consulting Physician (Urology) Con Memos as Consulting Physician (Pulmonary Disease) Deboraha Sprang, MD as Consulting Physician (Cardiology) Camillo Flaming, OD as Referring Physician (Optometry)   HPI  [name] is a 80 y.o. male is seen in followup for aborted cardiac arrest in the setting of modest ischemic cardiomyopathy with prior bypass surgery and an ejection fraction of 40-45%. He is status post ICD implantation  He underwent generator replacement ERI   He has had no recurrent events. He has remote syncope.   The patient denies SOB, chest pain, or palpitations; Last catheterization 2008 patent grafts ejection fraction 40-45% with anteroapical scar Right wound withThe patient denies chest pain, shortness of breath, nocturnal dyspnea, orthopnea or peripheral edema.  There have been no palpitations, lightheadedness or syncope.    Past Medical History  Diagnosis Date  . CAD (coronary artery disease)     a. MI/CABG 1996. b. VF arrest 2008: cath with severe native 3v disease, 3/3 grafts widely patent, mild to moderate left ventricular systolic dysfunction secondary to anteroapical scar. EF 40-45%.  . Cardiac arrest [facility]) 2008    2008: aborted cardiac arrest 2008 (c/b encephalopathy, VDRF, aspiriation PNA, CHF, elevated LFTs), s/p St. Jude ICD placement.  . Ischemic cardiomyopathy     a. EF 40-45% by cath 2008.  EF 39% by nuclear study 2016  . Diabetes mellitus, type 2 ([place]) 03/2011  . Disorder of bone and cartilage, unspecified   . Anemia, unspecified   . OSA on CPAP     sleep study 2008, severe OSA: CPAP 11 cm H2O  . Hypercholesterolemia   . GERD (gastroesophageal reflux disease)   . Hx of adenomatous colonic polyps 2003, 2013  . CKD  (chronic kidney disease), stage III     CrCl @50ml /min  . BPH with obstruction/lower urinary tract symptoms   . Hypogonadism male     managed by [facility] (Dr. Gaynelle Arabian)  . Erectile dysfunction        "           "      "              "                  "    . Essential hypertension   . Chronic venous insufficiency   . Obesity   . Arthritis   . Meniscal injury Jan/Feb 2016    Sustained shoveling snow; repaired by Dr. Gladstone Lighter     Past Surgical History  Procedure Laterality Date  . Coronary artery bypass graft  1996    3 vessel  . Pacemaker placement  07/2006    St Jude current VRRF--generator change 03/2015  . Colonoscopy w/ polypectomy  2003    Adenomatous; repeat 2006 was recommended but this was not done until 06/10/2011 (by different GI MD)  . Colonoscopy  06/10/11    One diminutive polyp removed (tubular adenoma, no high grade dysplasia).  Repeat colonoscopy 5 yrs (Dr. Ardis Hughs).  . Polypectomy    . Cardiac defibrillator placement    . Knee cartilage surgery  04/2014    Dr. Gladstone Lighter: torn meniscus repair  . Cardiovascular stress test  04/2014    High risk study: large scar, EF 39%, +WM abnl  .  Ep implantable device N/A 04/27/2015    Procedure: ICD Generator Changeout;  Surgeon: Deboraha Sprang, MD;  Location: [facility];  Service: Cardiovascular;  Laterality: N/A;    Current Outpatient Prescriptions  Medication Sig Dispense Refill  . acetaminophen (TYLENOL) 650 MG CR tablet Take 1,300 mg by mouth every 8 (eight) hours as needed for pain (Pt takes med each morning).    [name] aspirin EC 81 MG tablet Take 81 mg by mouth daily.    [name] atorvastatin (LIPITOR) 20 MG tablet Take 1 tablet (20 mg total) by mouth daily. 90 tablet 3  . b complex vitamins tablet Take 1 tablet by mouth daily.    . Glucos-Chondroit-Hyaluron-MSM (GLUCOSAMINE CHONDROITIN JOINT) TABS Take 1 tablet by mouth daily.    [name] KRILL OIL OMEGA-3 PO Take 1 capsule by mouth daily.    [name] lisinopril (PRINIVIL,ZESTRIL)  5 MG tablet Take 1 tablet (5 mg total) by mouth daily. 90 tablet 3  . metoprolol tartrate (LOPRESSOR) 25 MG tablet Take 1 tablet (25 mg total) by mouth 2 (two) times daily. (Patient taking differently: Take 50 mg by mouth daily. ) 180 tablet 3  . Multiple Vitamin (MULTIVITAMIN) tablet Take 1 tablet by mouth daily.    . NON FORMULARY Take 1 tablet by mouth daily. OPC 3.  antioxidant protection    . oxyCODONE-acetaminophen (PERCOCET/ROXICET) 5-325 MG tablet Take 1 tablet by mouth. Three times daily as needed    . pantoprazole (PROTONIX) 40 MG tablet Take 1 tablet (40 mg total) by mouth daily. 90 tablet 3  . predniSONE (STERAPRED UNI-PAK 21 TAB) 10 MG (21) TBPK tablet     . sildenafil (REVATIO) 20 MG tablet Take 20 mg by mouth as needed.    . testosterone cypionate (DEPOTESTOTERONE CYPIONATE) 100 MG/ML injection Inject into the muscle every 28 (twenty-eight) days. For IM use only     No current facility-administered medications for this visit.    Allergies  Allergen Reactions  . Naproxen Sodium Nausea Only    Review of Systems negative except from HPI and PMH  Physical Exam BP 126/72 mmHg  Pulse 73  Ht 6' (1.829 m)  Wt 232 lb (105.235 kg)  BMI 31.46 kg/m2 Well developed and well nourished in no acute distress HENT normal E scleral and icterus clear Neck Supple JVP flat; carotids brisk and full Clear to ausculation  Regular rate and rhythm, no murmurs gallops or rub Soft with active bowel sounds No clubbing cyanosis  Edema Alert and oriented, grossly normal motor and sensory function Skin Warm and Dry  ECG  NSR 14/13/41 NSTT  Assessment and  Plan  Ischemic Heart Disease [id number])  Syncope  ICD  St Jude The patient's device was interrogated.  The information was reviewed. No changes were made in the programming.    Obesity  Diabetes     No syncope  No ischemia  euvolemic  Diet for obesity and try to address DM this way

## 2015-08-02 NOTE — Patient Instructions (Addendum)
Medication Instructions:  Your physician recommends that you continue on your current medications as directed. Please refer to the Current Medication list given to you today.  Labwork: None ordered  Testing/Procedures: None ordered  Follow-Up: Remote monitoring is used to monitor your Pacemaker of ICD from home. This monitoring reduces the number of office visits required to check your device to one time per year. It allows [place] to keep an eye on the functioning of your device to ensure it is working properly. You are scheduled for a device check from home on 11/01/2015. You may send your transmission at any time that day. If you have a wireless device, the transmission will be sent automatically. After your physician reviews your transmission, you will receive a postcard with your next transmission date.  Your physician wants you to follow-up in: 9 months with Dr. Caryl Comes. You will receive a reminder letter in the mail two months in advance. If you don't receive a letter, please call our office to schedule the follow-up appointment.  If you need a refill on your cardiac medications before your next appointment, please call your pharmacy.   Thank you for choosing [facility]!!

## 2015-08-06 DIAGNOSIS — H04123 Dry eye syndrome of bilateral lacrimal glands: Secondary | ICD-10-CM | POA: Diagnosis not present

## 2015-08-06 DIAGNOSIS — H16223 Keratoconjunctivitis sicca, not specified as Sjogren's, bilateral: Secondary | ICD-10-CM | POA: Diagnosis not present

## 2015-08-24 DIAGNOSIS — E291 Testicular hypofunction: Secondary | ICD-10-CM | POA: Diagnosis not present

## 2015-08-29 DIAGNOSIS — M1712 Unilateral primary osteoarthritis, left knee: Secondary | ICD-10-CM | POA: Diagnosis not present

## 2015-08-29 DIAGNOSIS — G8918 Other acute postprocedural pain: Secondary | ICD-10-CM | POA: Diagnosis not present

## 2015-08-29 DIAGNOSIS — Y999 Unspecified external cause status: Secondary | ICD-10-CM | POA: Diagnosis not present

## 2015-08-29 DIAGNOSIS — S83242D Other tear of medial meniscus, current injury, left knee, subsequent encounter: Secondary | ICD-10-CM | POA: Diagnosis not present

## 2015-08-29 DIAGNOSIS — S83282D Other tear of lateral meniscus, current injury, left knee, subsequent encounter: Secondary | ICD-10-CM | POA: Diagnosis not present

## 2015-08-29 DIAGNOSIS — M94262 Chondromalacia, left knee: Secondary | ICD-10-CM | POA: Diagnosis not present

## 2015-08-29 DIAGNOSIS — S83272A Complex tear of lateral meniscus, current injury, left knee, initial encounter: Secondary | ICD-10-CM | POA: Diagnosis not present

## 2015-08-29 DIAGNOSIS — S83242A Other tear of medial meniscus, current injury, left knee, initial encounter: Secondary | ICD-10-CM | POA: Diagnosis not present

## 2015-09-13 DIAGNOSIS — M25512 Pain in left shoulder: Secondary | ICD-10-CM | POA: Diagnosis not present

## 2015-09-13 DIAGNOSIS — S83242D Other tear of medial meniscus, current injury, left knee, subsequent encounter: Secondary | ICD-10-CM | POA: Diagnosis not present

## 2015-09-13 DIAGNOSIS — G8929 Other chronic pain: Secondary | ICD-10-CM | POA: Diagnosis not present

## 2015-09-21 DIAGNOSIS — H16223 Keratoconjunctivitis sicca, not specified as Sjogren's, bilateral: Secondary | ICD-10-CM | POA: Diagnosis not present

## 2015-09-21 DIAGNOSIS — H04123 Dry eye syndrome of bilateral lacrimal glands: Secondary | ICD-10-CM | POA: Diagnosis not present

## 2015-09-24 DIAGNOSIS — M1712 Unilateral primary osteoarthritis, left knee: Secondary | ICD-10-CM | POA: Diagnosis not present

## 2015-09-24 DIAGNOSIS — Z4789 Encounter for other orthopedic aftercare: Secondary | ICD-10-CM | POA: Diagnosis not present

## 2015-09-25 DIAGNOSIS — E291 Testicular hypofunction: Secondary | ICD-10-CM | POA: Diagnosis not present

## 2015-10-19 DIAGNOSIS — M1712 Unilateral primary osteoarthritis, left knee: Secondary | ICD-10-CM | POA: Diagnosis not present

## 2015-10-29 DIAGNOSIS — M1712 Unilateral primary osteoarthritis, left knee: Secondary | ICD-10-CM | POA: Diagnosis not present

## 2015-10-29 DIAGNOSIS — I5022 Chronic systolic (congestive) heart failure: Secondary | ICD-10-CM | POA: Diagnosis not present

## 2015-10-30 ENCOUNTER — Telehealth: Payer: Self-pay | Admitting: Family Medicine

## 2015-10-30 DIAGNOSIS — E291 Testicular hypofunction: Secondary | ICD-10-CM | POA: Diagnosis not present

## 2015-10-30 NOTE — Telephone Encounter (Signed)
LM for patient to CB to make an appt for medical clearance. Advised patient he will also need a medical clearance from his cardiologist.

## 2015-10-31 ENCOUNTER — Encounter: Payer: Self-pay | Admitting: Family Medicine

## 2015-10-31 ENCOUNTER — Ambulatory Visit (INDEPENDENT_AMBULATORY_CARE_PROVIDER_SITE_OTHER): Payer: Medicare Other | Admitting: Family Medicine

## 2015-10-31 ENCOUNTER — Other Ambulatory Visit: Payer: Self-pay | Admitting: Surgical

## 2015-10-31 VITALS — BP 143/82 | HR 76 | Temp 97.4°F | Resp 18 | Ht 71.5 in | Wt 231.4 lb

## 2015-10-31 DIAGNOSIS — I255 Ischemic cardiomyopathy: Secondary | ICD-10-CM

## 2015-10-31 DIAGNOSIS — I10 Essential (primary) hypertension: Secondary | ICD-10-CM | POA: Diagnosis not present

## 2015-10-31 DIAGNOSIS — Z008 Encounter for other general examination: Secondary | ICD-10-CM

## 2015-10-31 DIAGNOSIS — Z23 Encounter for immunization: Secondary | ICD-10-CM

## 2015-10-31 DIAGNOSIS — E119 Type 2 diabetes mellitus without complications: Secondary | ICD-10-CM

## 2015-10-31 LAB — BASIC METABOLIC PANEL
BUN: 22 mg/dL (ref 6–23)
CO2: 29 mEq/L (ref 19–32)
Calcium: 9.4 mg/dL (ref 8.4–10.5)
Chloride: 103 mEq/L (ref 96–112)
Creatinine, Ser: 1.47 mg/dL (ref 0.40–1.50)
GFR: 49.27 mL/min — ABNORMAL LOW (ref >60.00)
Glucose, Bld: 123 mg/dL — ABNORMAL HIGH (ref 70–99)
Potassium: 5 mEq/L (ref 3.5–5.1)
Sodium: 138 mEq/L (ref 135–145)

## 2015-10-31 LAB — HEMOGLOBIN A1C: Hgb A1c MFr Bld: 6.8 % — ABNORMAL HIGH (ref 4.6–6.5)

## 2015-10-31 NOTE — Telephone Encounter (Signed)
Patient here today

## 2015-10-31 NOTE — Progress Notes (Signed)
OFFICE VISIT  10/31/2015   CC: Surgical clearance  HPI:    Patient is a 80 y.o. Caucasian male who presents for medical clearance for upcoming TKA set for 11/21/15: Dr. Gladstone Lighter. I last saw him 3 mo ago at which time he was doing very well.  He has had cardiac clearance for this already.  Has been feeling well except for pain in left knee. Adhering to diabetic diet, working on portion control.  No home glucose or bp monitoring.   He uses his CPAP machine every night.  He has annual f/u with his urologist for his hypogonadism 12/2015.  Past Medical History:  Diagnosis Date  . Anemia, unspecified   . Arthritis   . BPH with obstruction/lower urinary tract symptoms   . CAD (coronary artery disease)    a. MI/CABG 1996. b. VF arrest 2008: cath with severe native 3v disease, 3/3 grafts widely patent, mild to moderate left ventricular systolic dysfunction secondary to anteroapical scar. EF 40-45%.  . Cardiac arrest [facility]) 2008   2008: aborted cardiac arrest 2008 (c/b encephalopathy, VDRF, aspiriation PNA, CHF, elevated LFTs), s/p St. Jude ICD placement.  . Chronic venous insufficiency   . CKD (chronic kidney disease), stage III    CrCl @50ml /min  . Diabetes mellitus, type 2 ([place]) 03/2011  . Disorder of bone and cartilage, unspecified   . Erectile dysfunction       "           "      "              "                  "    . Essential hypertension   . GERD (gastroesophageal reflux disease)   . Hx of adenomatous colonic polyps 2003, 2013  . Hypercholesterolemia   . Hypogonadism male    managed by [facility] (Dr. Gaynelle Arabian)  . Ischemic cardiomyopathy    a. EF 40-45% by cath 2008.  EF 39% by nuclear study 2016  . Meniscal injury Jan/Feb 2016   Sustained shoveling snow; repaired by Dr. Gladstone Lighter   . Obesity   . OSA on CPAP    sleep study 2008, severe OSA: CPAP 11 cm H2O    Past Surgical History:  Procedure Laterality Date  . CARDIAC DEFIBRILLATOR PLACEMENT    . CARDIOVASCULAR  STRESS TEST  04/2014   High risk study: large scar, EF 39%, +WM abnl  . COLONOSCOPY  06/10/11   One diminutive polyp removed (tubular adenoma, no high grade dysplasia).  Repeat colonoscopy 5 yrs (Dr. Ardis Hughs).  . COLONOSCOPY W/ POLYPECTOMY  2003   Adenomatous; repeat 2006 was recommended but this was not done until 06/10/2011 (by different GI MD)  . CORONARY ARTERY BYPASS GRAFT  1996   3 vessel  . EP IMPLANTABLE DEVICE N/A 04/27/2015   Procedure: ICD Generator Changeout;  Surgeon: Deboraha Sprang, MD;  Location: [place] [facility];  Service: Cardiovascular;  Laterality: N/A;  . KNEE CARTILAGE SURGERY  04/2014   Dr. Gladstone Lighter: torn meniscus repair  . PACEMAKER PLACEMENT  07/2006   St Jude current VRRF--generator change 03/2015  . POLYPECTOMY      Outpatient Medications Prior to Visit  Medication Sig Dispense Refill  . acetaminophen (TYLENOL) 650 MG CR tablet Take 1,300 mg by mouth every 8 (eight) hours as needed for pain (Pt takes med each morning).    [name] atorvastatin (LIPITOR) 20 MG tablet Take 1 tablet (  20 mg total) by mouth daily. 90 tablet 3  . b complex vitamins tablet Take 1 tablet by mouth daily.    . Glucos-Chondroit-Hyaluron-MSM (GLUCOSAMINE CHONDROITIN JOINT) TABS Take 1 tablet by mouth daily.    [name] KRILL OIL OMEGA-3 PO Take 1 capsule by mouth daily.    [name] lisinopril (PRINIVIL,ZESTRIL) 5 MG tablet Take 1 tablet (5 mg total) by mouth daily. 90 tablet 3  . metoprolol tartrate (LOPRESSOR) 25 MG tablet Take 1 tablet (25 mg total) by mouth 2 (two) times daily. (Patient taking differently: Take 50 mg by mouth daily. ) 180 tablet 3  . Multiple Vitamin (MULTIVITAMIN) tablet Take 1 tablet by mouth daily.    . NON FORMULARY Take 1 tablet by mouth daily. OPC 3.  antioxidant protection    . oxyCODONE-acetaminophen (PERCOCET/ROXICET) 5-325 MG tablet Take 1 tablet by mouth. Three times daily as needed    . pantoprazole (PROTONIX) 40 MG tablet Take 1 tablet (40 mg total) by mouth daily. 90 tablet 3  .  sildenafil (REVATIO) 20 MG tablet Take 20 mg by mouth as needed.    . testosterone cypionate (DEPOTESTOTERONE CYPIONATE) 100 MG/ML injection Inject into the muscle every 28 (twenty-eight) days. For IM use only    . aspirin EC 81 MG tablet Take 81 mg by mouth daily.    . predniSONE (STERAPRED UNI-PAK 21 TAB) 10 MG (21) TBPK tablet      No facility-administered medications prior to visit.     Allergies  Allergen Reactions  . Naproxen Sodium Nausea Only    ROS Review of Systems  Constitutional: Negative for appetite change, chills, fatigue and fever.  HENT: Negative for congestion, dental problem, ear pain and sore throat.   Eyes: Negative for discharge, redness and visual disturbance.  Respiratory: Negative for cough, chest tightness, shortness of breath and wheezing.   Cardiovascular: Negative for chest pain, palpitations and leg swelling.  Gastrointestinal: Negative for abdominal pain, blood in stool, diarrhea, nausea and vomiting.  Genitourinary: Negative for difficulty urinating, dysuria, flank pain, frequency, hematuria and urgency.  Musculoskeletal: Negative for arthralgias, back pain, joint swelling, myalgias and neck stiffness.  Skin: Negative for pallor and rash.  Neurological: Negative for dizziness, speech difficulty, weakness and headaches.  Hematological: Negative for adenopathy. Does not bruise/bleed easily.  Psychiatric/Behavioral: Negative for confusion and sleep disturbance. The patient is not nervous/anxious.     PE: Blood pressure (!) 143/82, pulse 76, temperature 97.4 F (36.3 C), temperature source Oral, resp. rate 18, height 5' 11.5" (1.816 m), weight 231 lb 6.4 oz (105 kg), SpO2 96 %. Gen: Alert, well appearing.  Patient is oriented to person, place, time, and situation. AFFECT: pleasant, lucid thought and speech. [id number]: no injection, icteris, swelling, or exudate.  EOMI, PERRLA. Mouth: lips without lesion/swelling.  Oral mucosa pink and moist. Oropharynx  without erythema, exudate, or swelling.  CV: RRR, no m/r/g.   LUNGS: CTA bilat, nonlabored resps, good aeration in all lung fields. ABD: soft, NT, ND, BS normal.  No hepatospenomegaly or mass.  No bruits. EXT: no clubbing, cyanosis, or edema.  Diffuse hyperpigmentation changes to skin of pretibial regions.  No erythema or tenderness.   LABS:  Lab Results  Component Value Date   TSH 4.20 03/19/2010   Lab Results  Component Value Date   WBC 4.6 04/23/2015   HGB 15.1 04/23/2015   HCT 43.0 04/23/2015   MCV 94.3 04/23/2015   PLT 179 04/23/2015   Lab Results  Component Value Date   CREATININE  1.46 07/31/2015   BUN 21 07/31/2015   NA 137 07/31/2015   K 4.8 07/31/2015   CL 100 07/31/2015   CO2 28 07/31/2015   Lab Results  Component Value Date   ALT 28 07/31/2015   AST 25 07/31/2015   ALKPHOS 43 07/31/2015   BILITOT 0.7 07/31/2015   Lab Results  Component Value Date   CHOL 127 03/30/2015   Lab Results  Component Value Date   HDL 42.40 03/30/2015   Lab Results  Component Value Date   LDLCALC 68 03/30/2015   Lab Results  Component Value Date   TRIG 79.0 03/30/2015   Lab Results  Component Value Date   CHOLHDL 3 03/30/2015   Lab Results  Component Value Date   HGBA1C 7.0 (H) 07/31/2015    IMPRESSION AND PLAN:  1) Medical clearance for knee surgery/TKA (left). He is cleared for surgery from medical standpoint.  Cardiology has cleared him as well (per pt report today). Will fax clearance form now.  2) DM 2; diet controlled--historically well controlled. HbA1c check today.  3) HLD; tolerating statin.  Lipid panel good 6 mo ago, AST/ALT normal 3 mo ago.  4) CRI stage III: BMET today.  5) OSA: well controlled on CPAP.  6) Osteoarthritis, left knee: set for left TKA by Dr. Gladstone Lighter on 11/21/15.  An After Visit Summary was printed and given to the patient.  FOLLOW UP: Return for f/u 4-6 mo for CPE.  Signed:  Crissie Sickles, MD           10/31/2015

## 2015-11-01 ENCOUNTER — Encounter: Payer: Medicare Other | Admitting: *Deleted

## 2015-11-01 ENCOUNTER — Telehealth: Payer: Self-pay | Admitting: Cardiology

## 2015-11-01 NOTE — Telephone Encounter (Signed)
Spoke with pt and reminded pt of remote transmission that is due today. Pt verbalized understanding.   

## 2015-11-02 ENCOUNTER — Encounter: Payer: Self-pay | Admitting: Cardiology

## 2015-11-06 ENCOUNTER — Encounter: Payer: Self-pay | Admitting: Physician Assistant

## 2015-11-07 ENCOUNTER — Ambulatory Visit (INDEPENDENT_AMBULATORY_CARE_PROVIDER_SITE_OTHER): Payer: Medicare Other | Admitting: Physician Assistant

## 2015-11-07 ENCOUNTER — Encounter: Payer: Self-pay | Admitting: Physician Assistant

## 2015-11-07 VITALS — BP 118/62 | HR 76 | Ht 71.5 in | Wt 234.0 lb

## 2015-11-07 DIAGNOSIS — Z01818 Encounter for other preprocedural examination: Secondary | ICD-10-CM | POA: Diagnosis not present

## 2015-11-07 DIAGNOSIS — I255 Ischemic cardiomyopathy: Secondary | ICD-10-CM | POA: Diagnosis not present

## 2015-11-07 DIAGNOSIS — I2510 Atherosclerotic heart disease of native coronary artery without angina pectoris: Secondary | ICD-10-CM | POA: Diagnosis not present

## 2015-11-07 DIAGNOSIS — I10 Essential (primary) hypertension: Secondary | ICD-10-CM | POA: Diagnosis not present

## 2015-11-07 DIAGNOSIS — I2589 Other forms of chronic ischemic heart disease: Secondary | ICD-10-CM

## 2015-11-07 NOTE — Patient Instructions (Addendum)
Medication Instructions:  Your physician recommends that you continue on your current medications as directed. Please refer to the Current Medication list given to you today.   Labwork: None Ordered   Testing/Procedures: None Ordered   Follow-Up: Your physician wants you to follow-up in: 6 months with Dr. Caryl Comes. You will receive a reminder letter in the mail two months in advance. If you don't receive a letter, please call our office to schedule the follow-up appointment.  Remote monitoring is used to monitor your Pacemaker from home. This monitoring reduces the number of office visits required to check your device to one time per year. It allows [place] to keep an eye on the functioning of your device to ensure it is working properly. You are scheduled for a device check from home on 02/06/16. You may send your transmission at any time that day. If you have a wireless device, the t//ransmission will be sent automatically. After your physician reviews your transmission, you will receive a postcard with your next transmission date.     Any Other Special Instructions Will Be Listed Below (If Applicable).     If you need a refill on your cardiac medications before your next appointment, please call your pharmacy.

## 2015-11-07 NOTE — Progress Notes (Signed)
Cardiology Office Note Date:  11/07/2015  Patient ID:  [name], DOB [birth year], MRN [id number] PCP:  Tammi Sou, MD  Cardiologist:  Dr. Caryl Comes    Chief Complaint: pre-operative evalation  History of Present Illness: [name] is a 80 y.o. male with history of CAD (remote CABG), last cath in 2008 without intervention, ICM aborted cardiac arrest w/ICD, DM, OSA/CPAP, HLD, CRI, stage III, HTN comes in to the office today for cardiac evaluation/risk assessment prior to left knee surgery scheduled for 11/21/15.  He is being seen today for dr. Caryl Comes, who last saw him in June of this year at that time doing well with no changes made at that visit.  He reports feeling well outside of his knee pain.  He denies any kind of CP, palpitations or SOB, no dizziness, near syncope or syncope.  He has not been shocked by his device (ever).     Device information: SJM, single lead ICD, gen change 04/27/15, original implant 08/11/06, Dr. Caryl Comes  Past Medical History:  Diagnosis Date  . Anemia, unspecified   . Arthritis   . BPH with obstruction/lower urinary tract symptoms   . CAD (coronary artery disease)    a. MI/CABG 1996. b. VF arrest 2008: cath with severe native 3v disease, 3/3 grafts widely patent, mild to moderate left ventricular systolic dysfunction secondary to anteroapical scar. EF 40-45%.  . Cardiac arrest [facility]) 2008   2008: aborted cardiac arrest 2008 (c/b encephalopathy, VDRF, aspiriation PNA, CHF, elevated LFTs), s/p St. Jude ICD placement.  . Chronic venous insufficiency   . CKD (chronic kidney disease), stage III    CrCl @50ml /min  . Diabetes mellitus, type 2 ([place]) 03/2011  . Disorder of bone and cartilage, unspecified   . Erectile dysfunction       "           "      "              "                  "    . Essential hypertension   . GERD (gastroesophageal reflux disease)   . Hx of adenomatous colonic polyps 2003, 2013  . Hypercholesterolemia   . Hypogonadism male     managed by [facility] (Dr. Gaynelle Arabian)  . Ischemic cardiomyopathy    a. EF 40-45% by cath 2008.  EF 39% by nuclear study 2016  . Meniscal injury Jan/Feb 2016   Sustained shoveling snow; repaired by Dr. Gladstone Lighter   . Obesity   . OSA on CPAP    sleep study 2008, severe OSA: CPAP 11 cm H2O    Past Surgical History:  Procedure Laterality Date  . CARDIAC DEFIBRILLATOR PLACEMENT    . CARDIOVASCULAR STRESS TEST  04/2014   High risk study: large scar, EF 39%, +WM abnl  . COLONOSCOPY  06/10/11   One diminutive polyp removed (tubular adenoma, no high grade dysplasia).  Repeat colonoscopy 5 yrs (Dr. Ardis Hughs).  . COLONOSCOPY W/ POLYPECTOMY  2003   Adenomatous; repeat 2006 was recommended but this was not done until 06/10/2011 (by different GI MD)  . CORONARY ARTERY BYPASS GRAFT  1996   3 vessel  . EP IMPLANTABLE DEVICE N/A 04/27/2015   Procedure: ICD Generator Changeout;  Surgeon: Deboraha Sprang, MD;  Location: [place] [facility];  Service: Cardiovascular;  Laterality: N/A;  . KNEE CARTILAGE SURGERY  04/2014   Dr. Gladstone Lighter: torn meniscus repair  . PACEMAKER  PLACEMENT  07/2006   St Jude current VRRF--generator change 03/2015  . POLYPECTOMY      Current Outpatient Prescriptions  Medication Sig Dispense Refill  . acetaminophen (TYLENOL) 650 MG CR tablet Take 1,300 mg by mouth every 8 (eight) hours as needed for pain (Pt takes med each morning).    [name] aspirin 325 MG EC tablet Take 325 mg by mouth daily.    [name] atorvastatin (LIPITOR) 20 MG tablet Take 1 tablet (20 mg total) by mouth daily. 90 tablet 3  . b complex vitamins tablet Take 1 tablet by mouth daily.    . Glucos-Chondroit-Hyaluron-MSM (GLUCOSAMINE CHONDROITIN JOINT) TABS Take 1 tablet by mouth daily.    [name] KRILL OIL OMEGA-3 PO Take 1 capsule by mouth daily.    [name] lisinopril (PRINIVIL,ZESTRIL) 5 MG tablet Take 1 tablet (5 mg total) by mouth daily. 90 tablet 3  . metoprolol tartrate (LOPRESSOR) 25 MG tablet Take 1 tablet (25 mg total) by mouth  2 (two) times daily. (Patient taking differently: Take 50 mg by mouth daily. ) 180 tablet 3  . Multiple Vitamin (MULTIVITAMIN) tablet Take 1 tablet by mouth daily.    . NON FORMULARY Take 1 tablet by mouth daily. OPC 3.  antioxidant protection    . oxyCODONE-acetaminophen (PERCOCET/ROXICET) 5-325 MG tablet Take 1 tablet by mouth. Three times daily as needed    . pantoprazole (PROTONIX) 40 MG tablet Take 1 tablet (40 mg total) by mouth daily. 90 tablet 3  . sildenafil (REVATIO) 20 MG tablet Take 20 mg by mouth as needed.    . testosterone cypionate (DEPOTESTOTERONE CYPIONATE) 100 MG/ML injection Inject into the muscle every 28 (twenty-eight) days. For IM use only     No current facility-administered medications for this visit.     Allergies:   Naproxen sodium   Social History:  The patient  reports that he quit smoking about 45 years ago. His smoking use included Cigarettes. He has never used smokeless tobacco. He reports that he drinks about 4.2 oz of alcohol per week . He reports that he does not use drugs.   Family History:  The patient's family history includes Heart disease in his father and mother; Stroke in his mother.  ROS:  Please see the history of present illness. All other systems are reviewed and otherwise negative.   PHYSICAL EXAM:  VS:  BP 118/62   Pulse 76   Ht 5' 11.5" (1.816 m)   Wt 234 lb (106.1 kg)   BMI 32.18 kg/m  BMI: Body mass index is 32.18 kg/m. Well nourished, well developed, in no acute distress  HEENT: normocephalic, atraumatic  Neck: no JVD, carotid bruits or masses Cardiac:  RRR, no significant murmurs, no rubs, or gallops Lungs:  clear to auscultation bilaterally, no wheezing, rhonchi or rales  Abd: soft, nontender MS: no deformity or atrophy Ext: no edema  Skin: warm and dry, no rash Neuro:  No gross deficits appreciated Psych: euthymic mood, full affect  ICD site is stable, no tethering or discomfort   EKG:  Done today and reviewed with Dr.  Meda Coffee (DOD) is SR, ST/T changes appear similar to his prior EKGs ICD interrogation today: normal device function, battery and lead satus is stable, no observations, V paced <1%   04/25/14: lexiscan stress myoview Impression Exercise Capacity:  Lexiscan with no exercise. BP Response:  Hypotensive blood pressure response. Clinical Symptoms:  There is dyspnea. ECG Impression:  No significant ST segment change suggestive of ischemia. Comparison with Prior  Nuclear Study: No previous nuclear study performed Overall Impression:  High risk stress nuclear study with a large scar in the mid and apical anterior, true apex, basal and mid anteroseptal, inferoseptal and apical lateral walls consistent with a large scar in the entire LAD and partially in the RCA territory with peri-infarct ischemia in the PDA territory (SDS 3).  . LV Ejection Fraction: 39%.  LV Wall Motion:  Akinesis of the anterior and septal walls with apical dyskinesis.   07/24/06: TTE SUMMARY - The images are extremely limited. The septum is akinetic. The    posterior wall moves. I can not assess further. The left    ventricle was mildly dilated. This study was inadequate for    the evaluation of left ventricular regional wall motion. - Left atrial size was at the upper limits of normal.  Recent Labs: 04/23/2015: Hemoglobin 15.1; Platelets 179 07/31/2015: ALT 28 10/31/2015: BUN 22; Creatinine, Ser 1.47; Potassium 5.0; Sodium 138  03/30/2015: Cholesterol 127; HDL 42.40; LDL Cholesterol 68; Total CHOL/HDL Ratio 3; Triglycerides 79.0; VLDL 15.8   Estimated Creatinine Clearance: 52.6 mL/min (by C-G formula based on SCr of 1.47 mg/dL).   Wt Readings from Last 3 Encounters:  11/07/15 234 lb (106.1 kg)  10/31/15 231 lb 6.4 oz (105 kg)  08/02/15 232 lb (105.2 kg)     Other studies reviewed: Additional studies/records reviewed today include: summarized above ASSESSMENT AND PLAN:  1. CAD     Last stress noted above (and  cleared for his prior knee surgery at that time), no clinical changes since that time, patient is feeling well     no CP     On ASA, statin, BB  2. ICM w/ICD     Normal device function, no changes made     fluid status is stable by weight and exam     On BB/ACE  3. HTN     stable  4. HLD     Continue statin  Reviewed case with Dr. Meda Coffee, patient is felt an acceptable cardiac risk for his planned knee surgery.       Disposition: F/u with 3 month remote device check and Dr. Caryl Comes in 71mo, sooner if needed.  Current medicines are reviewed at length with the patient today.  The patient did not have any concerns regarding medicines.  Haywood Lasso, PA-C 11/07/2015 10:53 AM     [facility] [place] [address] [phone] (office)  [phone] (fax)

## 2015-11-08 NOTE — Patient Instructions (Signed)
[name]  11/08/2015   Your procedure is scheduled on: 11/21/2015    Report to [facility] Main  Entrance take [place]  elevators to 3rd floor to  [name] at   [phone] AM.  Call this number if you have problems the morning of surgery [phone]   Remember: ONLY 1 PERSON MAY GO WITH YOU TO SHORT STAY TO GET  READY MORNING OF [name].  Do not eat food or drink liquids :After Midnight.     Take these medicines the morning of surgery with A SIP OF WATER:   Metoprolol ( Toprol), Oxycodone if needed, Protonix                                 You may not have any metal on your body including hair pins and              piercings  Do not wear jewelry, , lotions, powders or perfumes, deodorant                          Men may shave face and neck.   Do not bring valuables to the hospital. [name].  Contacts, dentures or bridgework may not be worn into surgery.  Leave suitcase in the car. After surgery it may be brought to your room.         Special Instructions: N/A              Please read over the following fact sheets you were given: _____________________________________________________________________             [facility] - Preparing for Surgery Before surgery, you can play an important role.  Because skin is not sterile, your skin needs to be as free of germs as possible.  You can reduce the number of germs on your skin by washing with CHG (chlorahexidine gluconate) soap before surgery.  CHG is an antiseptic cleaner which kills germs and bonds with the skin to continue killing germs even after washing. Please DO NOT use if you have an allergy to CHG or antibacterial soaps.  If your skin becomes reddened/irritated stop using the CHG and inform your nurse when you arrive at Short Stay. Do not shave (including legs and underarms) for at least 48 hours prior to the first CHG shower.  You may  shave your face/neck. Please follow these instructions carefully:  1.  Shower with CHG Soap the night before surgery and the  morning of Surgery.  2.  If you choose to wash your hair, wash your hair first as usual with your  normal  shampoo.  3.  After you shampoo, rinse your hair and body thoroughly to remove the  shampoo.                           4.  Use CHG as you would any other liquid soap.  You can apply chg directly  to the skin and wash                       Gently with a scrungie or clean washcloth.  5.  Apply the CHG Soap to your body ONLY FROM THE NECK DOWN.   Do not use on face/ open                           Wound or open sores. Avoid contact with eyes, ears mouth and genitals (private parts).                       Wash face,  Genitals (private parts) with your normal soap.             6.  Wash thoroughly, paying special attention to the area where your surgery  will be performed.  7.  Thoroughly rinse your body with warm water from the neck down.  8.  DO NOT shower/wash with your normal soap after using and rinsing off  the CHG Soap.                9.  Pat yourself dry with a clean towel.            10.  Wear clean pajamas.            11.  Place clean sheets on your bed the night of your first shower and do not  sleep with pets. Day of Surgery : Do not apply any lotions/deodorants the morning of surgery.  Please wear clean clothes to the hospital/surgery center.  FAILURE TO FOLLOW THESE INSTRUCTIONS MAY RESULT IN THE CANCELLATION OF YOUR SURGERY PATIENT SIGNATURE_________________________________  NURSE SIGNATURE__________________________________  ________________________________________________________________________  WHAT IS A BLOOD TRANSFUSION? Blood Transfusion Information  A transfusion is the replacement of blood or some of its parts. Blood is made up of multiple cells which provide different functions.  Red blood cells carry oxygen and are used for blood loss  replacement.  White blood cells fight against infection.  Platelets control bleeding.  Plasma helps clot blood.  Other blood products are available for specialized needs, such as hemophilia or other clotting disorders. BEFORE THE TRANSFUSION  Who gives blood for transfusions?   Healthy volunteers who are fully evaluated to make sure their blood is safe. This is blood bank blood. Transfusion therapy is the safest it has ever been in the practice of medicine. Before blood is taken from a donor, a complete history is taken to make sure that person has no history of diseases nor engages in risky social behavior (examples are intravenous drug use or sexual activity with multiple partners). The donor's travel history is screened to minimize risk of transmitting infections, such as malaria. The donated blood is tested for signs of infectious diseases, such as HIV and hepatitis. The blood is then tested to be sure it is compatible with you in order to minimize the chance of a transfusion reaction. If you or a relative donates blood, this is often done in anticipation of surgery and is not appropriate for emergency situations. It takes many days to process the donated blood. RISKS AND COMPLICATIONS Although transfusion therapy is very safe and saves many lives, the main dangers of transfusion include:   Getting an infectious disease.  Developing a transfusion reaction. This is an allergic reaction to something in the blood you were given. Every precaution is taken to prevent this. The decision to have a blood transfusion has been considered carefully by your caregiver before blood is given. Blood is not given unless the benefits outweigh the risks. AFTER THE TRANSFUSION  Right after receiving a  blood transfusion, you will usually feel much better and more energetic. This is especially true if your red blood cells have gotten low (anemic). The transfusion raises the level of the red blood cells which  carry oxygen, and this usually causes an energy increase.  The nurse administering the transfusion will monitor you carefully for complications. HOME CARE INSTRUCTIONS  No special instructions are needed after a transfusion. You may find your energy is better. Speak with your caregiver about any limitations on activity for underlying diseases you may have. SEEK MEDICAL CARE IF:   Your condition is not improving after your transfusion.  You develop redness or irritation at the intravenous (IV) site. SEEK IMMEDIATE MEDICAL CARE IF:  Any of the following symptoms occur over the next 12 hours:  Shaking chills.  You have a temperature by mouth above 102 F (38.9 C), not controlled by medicine.  Chest, back, or muscle pain.  People around you feel you are not acting correctly or are confused.  Shortness of breath or difficulty breathing.  Dizziness and fainting.  You get a rash or develop hives.  You have a decrease in urine output.  Your urine turns a dark color or changes to pink, red, or brown. Any of the following symptoms occur over the next 10 days:  You have a temperature by mouth above 102 F (38.9 C), not controlled by medicine.  Shortness of breath.  Weakness after normal activity.  The white part of the eye turns yellow (jaundice).  You have a decrease in the amount of urine or are urinating less often.  Your urine turns a dark color or changes to pink, red, or brown. Document Released: 02/01/2000 Document Revised: 04/28/2011 Document Reviewed: 09/20/2007 ExitCare Patient Information 2014 [place].  _______________________________________________________________________  Incentive Spirometer  An incentive spirometer is a tool that can help keep your lungs clear and active. This tool measures how well you are filling your lungs with each breath. Taking long deep breaths may help reverse or decrease the chance of developing breathing (pulmonary) problems  (especially infection) following:  A long period of time when you are unable to move or be active. BEFORE THE PROCEDURE   If the spirometer includes an indicator to show your best effort, your nurse or respiratory therapist will set it to a desired goal.  If possible, sit up straight or lean slightly forward. Try not to slouch.  Hold the incentive spirometer in an upright position. INSTRUCTIONS FOR USE  1. Sit on the edge of your bed if possible, or sit up as far as you can in bed or on a chair. 2. Hold the incentive spirometer in an upright position. 3. Breathe out normally. 4. Place the mouthpiece in your mouth and seal your lips tightly around it. 5. Breathe in slowly and as deeply as possible, raising the piston or the ball toward the top of the column. 6. Hold your breath for 3-5 seconds or for as long as possible. Allow the piston or ball to fall to the bottom of the column. 7. Remove the mouthpiece from your mouth and breathe out normally. 8. Rest for a few seconds and repeat Steps 1 through 7 at least 10 times every 1-2 hours when you are awake. Take your time and take a few normal breaths between deep breaths. 9. The spirometer may include an indicator to show your best effort. Use the indicator as a goal to work toward during each repetition. 10. After each set of 10  deep breaths, practice coughing to be sure your lungs are clear. If you have an incision (the cut made at the time of surgery), support your incision when coughing by placing a pillow or rolled up towels firmly against it. Once you are able to get out of bed, walk around indoors and cough well. You may stop using the incentive spirometer when instructed by your caregiver.  RISKS AND COMPLICATIONS  Take your time so you do not get dizzy or light-headed.  If you are in pain, you may need to take or ask for pain medication before doing incentive spirometry. It is harder to take a deep breath if you are having  pain. AFTER USE  Rest and breathe slowly and easily.  It can be helpful to keep track of a log of your progress. Your caregiver can provide you with a simple table to help with this. If you are using the spirometer at home, follow these instructions: [place] IF:   You are having difficultly using the spirometer.  You have trouble using the spirometer as often as instructed.  Your pain medication is not giving enough relief while using the spirometer.  You develop fever of 100.5 F (38.1 C) or higher. SEEK IMMEDIATE MEDICAL CARE IF:   You cough up bloody sputum that had not been present before.  You develop fever of 102 F (38.9 C) or greater.  You develop worsening pain at or near the incision site. MAKE SURE YOU:   Understand these instructions.  Will watch your condition.  Will get help right away if you are not doing well or get worse. Document Released: 06/16/2006 Document Revised: 04/28/2011 Document Reviewed: 08/17/2006 [facility] Patient Information 2014 [place], [place].   ________________________________________________________________________

## 2015-11-12 ENCOUNTER — Encounter (HOSPITAL_COMMUNITY): Payer: Self-pay

## 2015-11-12 ENCOUNTER — Encounter (HOSPITAL_COMMUNITY)
Admission: RE | Admit: 2015-11-12 | Discharge: 2015-11-12 | Disposition: A | Discharge status: Home / Self Care | Payer: Medicare Other | Source: Ambulatory Visit | Attending: Orthopedic Surgery | Admitting: Orthopedic Surgery

## 2015-11-12 ENCOUNTER — Ambulatory Visit (HOSPITAL_COMMUNITY)
Admission: RE | Admit: 2015-11-12 | Discharge: 2015-11-12 | Disposition: A | Discharge status: Home / Self Care | Payer: Medicare Other | Source: Ambulatory Visit | Attending: Surgical | Admitting: Surgical

## 2015-11-12 DIAGNOSIS — Z01810 Encounter for preprocedural cardiovascular examination: Secondary | ICD-10-CM | POA: Insufficient documentation

## 2015-11-12 DIAGNOSIS — R918 Other nonspecific abnormal finding of lung field: Secondary | ICD-10-CM | POA: Diagnosis not present

## 2015-11-12 DIAGNOSIS — Z01818 Encounter for other preprocedural examination: Secondary | ICD-10-CM

## 2015-11-12 DIAGNOSIS — M1712 Unilateral primary osteoarthritis, left knee: Secondary | ICD-10-CM | POA: Insufficient documentation

## 2015-11-12 DIAGNOSIS — Z01812 Encounter for preprocedural laboratory examination: Secondary | ICD-10-CM | POA: Diagnosis not present

## 2015-11-12 HISTORY — DX: Presence of automatic (implantable) cardiac defibrillator: Z95.810

## 2015-11-12 HISTORY — DX: Acute myocardial infarction, unspecified: I21.9

## 2015-11-12 LAB — CBC WITH DIFFERENTIAL/PLATELET
Basophils Absolute: 0 10*3/uL (ref 0.0–0.1)
Basophils Relative: 0 %
Eosinophils Absolute: 0 10*3/uL (ref 0.0–0.7)
Eosinophils Relative: 0 %
HCT: 41.6 % (ref 39.0–52.0)
Hemoglobin: 14.2 g/dL (ref 13.0–17.0)
Lymphocytes Relative: 10 %
Lymphs Abs: 1.6 10*3/uL (ref 0.7–4.0)
MCH: 31.5 pg (ref 26.0–34.0)
MCHC: 34.1 g/dL (ref 30.0–36.0)
MCV: 92.2 fL (ref 78.0–100.0)
Monocytes Absolute: 1.9 10*3/uL — ABNORMAL HIGH (ref 0.1–1.0)
Monocytes Relative: 12 %
Neutro Abs: 13 10*3/uL — ABNORMAL HIGH (ref 1.7–7.7)
Neutrophils Relative %: 78 %
Platelets: 158 10*3/uL (ref 150–400)
RBC: 4.51 MIL/uL (ref 4.22–5.81)
RDW: 14.4 % (ref 11.5–15.5)
WBC: 16.4 10*3/uL — ABNORMAL HIGH (ref 4.0–10.5)

## 2015-11-12 LAB — URINALYSIS, ROUTINE W REFLEX MICROSCOPIC
Bilirubin Urine: NEGATIVE
Glucose, UA: NEGATIVE mg/dL
Ketones, ur: NEGATIVE mg/dL
Nitrite: NEGATIVE
Protein, ur: 30 mg/dL — AB
Specific Gravity, Urine: 1.014 (ref 1.005–1.030)
pH: 5.5 (ref 5.0–8.0)

## 2015-11-12 LAB — COMPREHENSIVE METABOLIC PANEL
ALT: 21 U/L (ref 17–63)
AST: 23 U/L (ref 15–41)
Albumin: 4.1 g/dL (ref 3.5–5.0)
Alkaline Phosphatase: 45 U/L (ref 38–126)
Anion gap: 9 (ref 5–15)
BUN: 18 mg/dL (ref 6–20)
CO2: 25 mmol/L (ref 22–32)
Calcium: 9.2 mg/dL (ref 8.9–10.3)
Chloride: 103 mmol/L (ref 101–111)
Creatinine, Ser: 1.58 mg/dL — ABNORMAL HIGH (ref 0.61–1.24)
GFR calc Af Amer: 47 mL/min — ABNORMAL LOW (ref >60)
GFR calc non Af Amer: 41 mL/min — ABNORMAL LOW (ref >60)
Glucose, Bld: 121 mg/dL — ABNORMAL HIGH (ref 65–99)
Potassium: 4.1 mmol/L (ref 3.5–5.1)
Sodium: 137 mmol/L (ref 135–145)
Total Bilirubin: 0.7 mg/dL (ref 0.3–1.2)
Total Protein: 7.6 g/dL (ref 6.5–8.1)

## 2015-11-12 LAB — SURGICAL PCR SCREEN
MRSA, PCR: NEGATIVE
Staphylococcus aureus: NEGATIVE

## 2015-11-12 LAB — APTT: aPTT: 31 seconds (ref 24–36)

## 2015-11-12 LAB — ABO/RH: ABO/RH(D): A POS

## 2015-11-12 LAB — PROTIME-INR
INR: 1.08
Prothrombin Time: 14.1 seconds (ref 11.4–15.2)

## 2015-11-12 LAB — URINE MICROSCOPIC-ADD ON

## 2015-11-12 NOTE — Progress Notes (Signed)
Patient reports difficulty urinating and burning at time of preop .  To see Dr Gaynelle Arabian on 11/13/2015 at 0800am per patient.

## 2015-11-12 NOTE — Progress Notes (Signed)
EKG-11/07/15- EPIC 08/02/2015- last Device check in EPIC  11/07/15- LOV- Cardiology- EPIC  04/25/2014- Stress Test- EPIC  10/31/15- HGBA1C- in epic- 6.8

## 2015-11-12 NOTE — Progress Notes (Signed)
CBC done 11/12/2015- faxed via EPIC to Dr Gladstone Lighter along with note regarding painful and burning upon urination .  To See Dr Gaynelle Arabian on 11/13/2015 at 0800am.

## 2015-11-12 NOTE — Progress Notes (Signed)
U/A and micro along with CMP done 11/12/15 faxed via EPIC to Dr Gladstone Lighter along with cover sheet note stating patient being seen by Dr Gaynelle Arabian on 11/13/2015 for pain and burning upon urination.

## 2015-11-13 DIAGNOSIS — R300 Dysuria: Secondary | ICD-10-CM | POA: Diagnosis not present

## 2015-11-13 DIAGNOSIS — E291 Testicular hypofunction: Secondary | ICD-10-CM | POA: Diagnosis not present

## 2015-11-16 ENCOUNTER — Encounter (HOSPITAL_COMMUNITY): Payer: Self-pay | Admitting: *Deleted

## 2015-11-20 NOTE — H&P (Signed)
TOTAL KNEE ADMISSION H&P  Patient is being admitted for left total knee arthroplasty.  Subjective:  Chief Complaint:left knee pain.  HPI: [name], 80 y.o. male, has a history of pain and functional disability in the left knee due to arthritis and has failed non-surgical conservative treatments for greater than 12 weeks to includeNSAID's and/or analgesics, corticosteriod injections, flexibility and strengthening excercises and activity modification.  Onset of symptoms was gradual, starting 8 years ago with gradually worsening course since that time. The patient noted prior procedures on the knee to include  arthroscopy and menisectomy on the left knee(s).  Patient currently rates pain in the left knee(s) at 8 out of 10 with activity. Patient has night pain, worsening of pain with activity and weight bearing, pain that interferes with activities of daily living, pain with passive range of motion, crepitus and joint swelling.  Patient has evidence of periarticular osteophytes and joint space narrowing by imaging studies. There is no active infection.  Patient Active Problem List   Diagnosis Date Noted  . Ischemic cardiomyopathy   . Essential hypertension 03/22/2014  . Polypharmacy 03/22/2014  . OSA on CPAP 10/12/2013  . Chronic renal insufficiency, stage III (moderate)   . Health maintenance examination 07/06/2012  . Hyperglycemia 03/15/2012  . Diabetes mellitus without complication ([place]) [id number]  . Hypogonadism, male 06/26/2011  . Cardiac arrest ([place]) 04/15/2011  . Automatic implantable cardioverter-defibrillator in situ 04/15/2011  . CAD (coronary artery disease) 03/24/2011  . Chronic venous insufficiency 03/24/2011  . Osteoarthritis of finger 03/24/2011  . UNSPECIFIED VITAMIN D DEFICIENCY 03/19/2010  . ERECTILE DYSFUNCTION, NON-ORGANIC, MILD 03/19/2010  . GERD 01/18/2009  . Hyperlipidemia 12/07/2007  . OSTEOPENIA 12/07/2007   Past Medical History:  Diagnosis Date  . AICD  (automatic cardioverter/defibrillator) present   . Anemia, unspecified   . Arthritis   . BPH with obstruction/lower urinary tract symptoms   . CAD (coronary artery disease)    a. MI/CABG 1996. b. VF arrest 2008: cath with severe native 3v disease, 3/3 grafts widely patent, mild to moderate left ventricular systolic dysfunction secondary to anteroapical scar. EF 40-45%.  . Cardiac arrest [facility]) 2008   2008: aborted cardiac arrest 2008 (c/b encephalopathy, VDRF, aspiriation PNA, CHF, elevated LFTs), s/p St. Jude ICD placement.  . CHF (congestive heart failure) ([place])   . Chronic venous insufficiency   . CKD (chronic kidney disease), stage III    difficulty urinating seeing Dr Gaynelle Arabian on 11/13/2015 at 0800am  . Diabetes mellitus, type 2 ([place]) 03/2011   patient denies- on 10/2015   . Disorder of bone and cartilage, unspecified   . Erectile dysfunction       "           "      "              "                  "    . Essential hypertension   . GERD (gastroesophageal reflux disease)   . Hx of adenomatous colonic polyps 2003, 2013  . Hypercholesterolemia   . Hypogonadism male    managed by [facility] (Dr. Gaynelle Arabian)  . Ischemic cardiomyopathy    a. EF 40-45% by cath 2008.  EF 39% by nuclear study 2016  . Meniscal injury Jan/Feb 2016   Sustained shoveling snow; repaired by Dr. Gladstone Lighter   . Myocardial infarction   . Obesity   . OSA on CPAP    sleep study  2008, severe OSA: CPAP 11 cm H2O  . Urinary tract infection    diagnosed on 11/13/2015     Past Surgical History:  Procedure Laterality Date  . CARDIAC DEFIBRILLATOR PLACEMENT    . CARDIOVASCULAR STRESS TEST  04/2014   High risk study: large scar, EF 39%, +WM abnl  . COLONOSCOPY  06/10/11   One diminutive polyp removed (tubular adenoma, no high grade dysplasia).  Repeat colonoscopy 5 yrs (Dr. Ardis Hughs).  . COLONOSCOPY W/ POLYPECTOMY  2003   Adenomatous; repeat 2006 was recommended but this was not done until 06/10/2011 (by different GI  MD)  . CORONARY ARTERY BYPASS GRAFT  1996   3 vessel  . EP IMPLANTABLE DEVICE N/A 04/27/2015   Procedure: ICD Generator Changeout;  Surgeon: Deboraha Sprang, MD;  Location: [facility];  Service: Cardiovascular;  Laterality: N/A;  . KNEE CARTILAGE SURGERY  04/2014   Dr. Gladstone Lighter: torn meniscus repair  . PACEMAKER PLACEMENT  07/2006   St Jude current VRRF--generator change 03/2015  . POLYPECTOMY    . right carpal tunnel release         Current Outpatient Prescriptions:  .  acetaminophen (TYLENOL) 650 MG CR tablet, Take 1,300 mg by mouth every 8 (eight) hours as needed for pain (Pt takes med each morning)., Disp: , Rfl:  .  aspirin 325 MG EC tablet, Take 325 mg by mouth daily., Disp: , Rfl:  .  atorvastatin (LIPITOR) 20 MG tablet, Take 1 tablet (20 mg total) by mouth daily., Disp: 90 tablet, Rfl: 3 .  b complex vitamins tablet, Take 1 tablet by mouth daily., Disp: , Rfl:  .  CALCIUM PO, Take 1 tablet by mouth daily., Disp: , Rfl:  .  Glucos-Chondroit-Hyaluron-MSM (GLUCOSAMINE CHONDROITIN JOINT) TABS, Take 1 tablet by mouth daily., Disp: , Rfl:  .  KRILL OIL OMEGA-3 PO, Take 1 capsule by mouth daily., Disp: , Rfl:  .  lisinopril (PRINIVIL,ZESTRIL) 5 MG tablet, Take 1 tablet (5 mg total) by mouth daily., Disp: 90 tablet, Rfl: 3 .  Menthol, Topical Analgesic, (BIOFREEZE EX), Apply 1 application topically daily as needed (pain)., Disp: , Rfl:  .  metoprolol tartrate (LOPRESSOR) 25 MG tablet, Take 1 tablet (25 mg total) by mouth 2 (two) times daily., Disp: 180 tablet, Rfl: 3 .  Multiple Vitamin (MULTIVITAMIN) tablet, Take 1 tablet by mouth daily., Disp: , Rfl:  .  NON FORMULARY, Take 1 tablet by mouth daily. OPC 3.  antioxidant protection, Disp: , Rfl:  .  pantoprazole (PROTONIX) 40 MG tablet, Take 1 tablet (40 mg total) by mouth daily., Disp: 90 tablet, Rfl: 3 .  sildenafil (REVATIO) 20 MG tablet, Take 20 mg by mouth as needed., Disp: , Rfl:  .  sulfamethoxazole-trimethoprim (BACTRIM  DS,SEPTRA DS) 800-160 MG tablet, Take 1 tablet by mouth 2 (two) times daily. For 7 days, Disp: , Rfl:  .  testosterone cypionate (DEPOTESTOTERONE CYPIONATE) 100 MG/ML injection, Inject into the muscle every 28 (twenty-eight) days. For IM use only, Disp: , Rfl:  .  oxyCODONE-acetaminophen (PERCOCET/ROXICET) 5-325 MG tablet, Take by mouth every 4 (four) hours as needed for severe pain., Disp: , Rfl:   Allergies  Allergen Reactions  . Naproxen Sodium Nausea Only    Social History  Substance Use Topics  . Smoking status: Former Smoker    Types: Cigarettes    Quit date: 02/17/1970  . Smokeless tobacco: Never Used  . Alcohol use 4.2 oz/week    7 Glasses of wine per week  Family History  Problem Relation Age of Onset  . Heart disease Mother   . Stroke Mother   . Heart disease Father   . Colon cancer Neg Hx   . Esophageal cancer Neg Hx   . Stomach cancer Neg Hx   . Rectal cancer Neg Hx      Review of Systems  Constitutional: Negative.   HENT: Positive for hearing loss. Negative for congestion, ear discharge, ear pain, nosebleeds, sore throat and tinnitus.   Eyes: Negative.   Respiratory: Negative.  Negative for stridor.   Cardiovascular: Negative.   Gastrointestinal: Negative.   Genitourinary: Positive for frequency. Negative for dysuria, flank pain, hematuria and urgency.  Musculoskeletal: Positive for joint pain and myalgias. Negative for back pain, falls and neck pain.  Skin: Negative.   Neurological: Negative.  Negative for headaches.  Endo/Heme/Allergies: Negative.   Psychiatric/Behavioral: Negative.     Objective:  Physical Exam  Constitutional: He is oriented to person, place, and time. He appears well-developed. No distress.  Obese  HENT:  Head: Normocephalic and atraumatic.  Right Ear: External ear normal.  Left Ear: External ear normal.  Nose: Nose normal.  Mouth/Throat: Oropharynx is clear and moist.  Eyes: Conjunctivae and EOM are normal.  Neck: Normal range  of motion. Neck supple.  Cardiovascular: Normal rate, regular rhythm, normal heart sounds and intact distal pulses.   No murmur heard. Respiratory: Effort normal and breath sounds normal. No respiratory distress. He has no wheezes.  GI: Soft. Bowel sounds are normal. He exhibits no distension. There is no tenderness.  Musculoskeletal:       Right hip: Normal.       Left hip: Normal.       Right knee: Normal.       Left knee: He exhibits decreased range of motion and swelling. He exhibits no effusion and no erythema. Tenderness found. Medial joint line and lateral joint line tenderness noted.  Neurological: He is alert and oriented to person, place, and time. He has normal strength. No sensory deficit.  Skin: No rash noted. He is not diaphoretic. No erythema.  Psychiatric: He has a normal mood and affect. His behavior is normal.    Vitals  Weight: 232 lb Height: 72in Body Surface Area: 2.27 m Body Mass Index: 31.46 kg/m  Pulse: 76 (Regular)  BP: 125/70 (Sitting, Left Arm, Standard)   Imaging Review Plain radiographs demonstrate severe degenerative joint disease of the left knee(s). The overall alignment ismild varus. The bone quality appears to be good for age and reported activity level.  Assessment/Plan:  End stage primary osteoarthritis, left knee   The patient history, physical examination, clinical judgment of the provider and imaging studies are consistent with end stage degenerative joint disease of the left knee(s) and total knee arthroplasty is deemed medically necessary. The treatment options including medical management, injection therapy arthroscopy and arthroplasty were discussed at length. The risks and benefits of total knee arthroplasty were presented and reviewed. The risks due to aseptic loosening, infection, stiffness, patella tracking problems, thromboembolic complications and other imponderables were discussed. The patient acknowledged the explanation,  agreed to proceed with the plan and consent was signed. Patient is being admitted for inpatient treatment for surgery, pain control, PT, OT, prophylactic antibiotics, VTE prophylaxis, progressive ambulation and ADL's and discharge planning. The patient is planning to be discharged home with outpatient therapy.  PCP: Dr. Ricardo Jericho Cardio: Dr. Caryl Comes Therapy Plans: direct outpatient at [facility] to start 10/9 Home with wife  Ardeen Jourdain, PA-C

## 2015-11-21 ENCOUNTER — Inpatient Hospital Stay (HOSPITAL_COMMUNITY): Payer: Medicare Other | Admitting: Certified Registered Nurse Anesthetist

## 2015-11-21 ENCOUNTER — Encounter (HOSPITAL_COMMUNITY)
Admission: RE | Disposition: A | Discharge status: Home Health | Payer: Self-pay | Source: Ambulatory Visit | Attending: Orthopedic Surgery

## 2015-11-21 ENCOUNTER — Encounter (HOSPITAL_COMMUNITY): Payer: Self-pay

## 2015-11-21 ENCOUNTER — Inpatient Hospital Stay (HOSPITAL_COMMUNITY)
Admission: RE | Admit: 2015-11-21 | Discharge: 2015-11-23 | DRG: 470 | Disposition: A | Discharge status: Home Health | Payer: Medicare Other | Source: Ambulatory Visit | Attending: Orthopedic Surgery | Admitting: Orthopedic Surgery

## 2015-11-21 DIAGNOSIS — I129 Hypertensive chronic kidney disease with stage 1 through stage 4 chronic kidney disease, or unspecified chronic kidney disease: Secondary | ICD-10-CM | POA: Diagnosis not present

## 2015-11-21 DIAGNOSIS — Z6831 Body mass index (BMI) 31.0-31.9, adult: Secondary | ICD-10-CM | POA: Diagnosis not present

## 2015-11-21 DIAGNOSIS — Z87891 Personal history of nicotine dependence: Secondary | ICD-10-CM | POA: Diagnosis not present

## 2015-11-21 DIAGNOSIS — G4730 Sleep apnea, unspecified: Secondary | ICD-10-CM | POA: Diagnosis not present

## 2015-11-21 DIAGNOSIS — Z8249 Family history of ischemic heart disease and other diseases of the circulatory system: Secondary | ICD-10-CM | POA: Diagnosis not present

## 2015-11-21 DIAGNOSIS — Z951 Presence of aortocoronary bypass graft: Secondary | ICD-10-CM | POA: Diagnosis not present

## 2015-11-21 DIAGNOSIS — I509 Heart failure, unspecified: Secondary | ICD-10-CM | POA: Diagnosis present

## 2015-11-21 DIAGNOSIS — I2510 Atherosclerotic heart disease of native coronary artery without angina pectoris: Secondary | ICD-10-CM | POA: Diagnosis present

## 2015-11-21 DIAGNOSIS — M1712 Unilateral primary osteoarthritis, left knee: Principal | ICD-10-CM | POA: Diagnosis present

## 2015-11-21 DIAGNOSIS — Z7982 Long term (current) use of aspirin: Secondary | ICD-10-CM

## 2015-11-21 DIAGNOSIS — N183 Chronic kidney disease, stage 3 (moderate): Secondary | ICD-10-CM | POA: Diagnosis present

## 2015-11-21 DIAGNOSIS — G4733 Obstructive sleep apnea (adult) (pediatric): Secondary | ICD-10-CM | POA: Diagnosis not present

## 2015-11-21 DIAGNOSIS — Z79899 Other long term (current) drug therapy: Secondary | ICD-10-CM | POA: Diagnosis not present

## 2015-11-21 DIAGNOSIS — E669 Obesity, unspecified: Secondary | ICD-10-CM | POA: Diagnosis present

## 2015-11-21 DIAGNOSIS — I130 Hypertensive heart and chronic kidney disease with heart failure and stage 1 through stage 4 chronic kidney disease, or unspecified chronic kidney disease: Secondary | ICD-10-CM | POA: Diagnosis present

## 2015-11-21 DIAGNOSIS — I252 Old myocardial infarction: Secondary | ICD-10-CM | POA: Diagnosis not present

## 2015-11-21 DIAGNOSIS — R262 Difficulty in walking, not elsewhere classified: Secondary | ICD-10-CM

## 2015-11-21 DIAGNOSIS — M25562 Pain in left knee: Secondary | ICD-10-CM | POA: Diagnosis not present

## 2015-11-21 DIAGNOSIS — Z8674 Personal history of sudden cardiac arrest: Secondary | ICD-10-CM | POA: Diagnosis not present

## 2015-11-21 DIAGNOSIS — E119 Type 2 diabetes mellitus without complications: Secondary | ICD-10-CM | POA: Diagnosis present

## 2015-11-21 DIAGNOSIS — E785 Hyperlipidemia, unspecified: Secondary | ICD-10-CM | POA: Diagnosis present

## 2015-11-21 DIAGNOSIS — Z96652 Presence of left artificial knee joint: Secondary | ICD-10-CM

## 2015-11-21 DIAGNOSIS — Z95810 Presence of automatic (implantable) cardiac defibrillator: Secondary | ICD-10-CM | POA: Diagnosis not present

## 2015-11-21 HISTORY — DX: Urinary tract infection, site not specified: N39.0

## 2015-11-21 HISTORY — PX: TOTAL KNEE ARTHROPLASTY: SHX125

## 2015-11-21 LAB — TYPE AND SCREEN
ABO/RH(D): A POS
Antibody Screen: NEGATIVE

## 2015-11-21 LAB — GLUCOSE, CAPILLARY
Glucose-Capillary: 142 mg/dL — ABNORMAL HIGH (ref 65–99)
Glucose-Capillary: 151 mg/dL — ABNORMAL HIGH (ref 65–99)

## 2015-11-21 LAB — URINALYSIS, ROUTINE W REFLEX MICROSCOPIC
Bilirubin Urine: NEGATIVE
Glucose, UA: NEGATIVE mg/dL
Hgb urine dipstick: NEGATIVE
Ketones, ur: NEGATIVE mg/dL
Leukocytes, UA: NEGATIVE
Nitrite: NEGATIVE
Protein, ur: NEGATIVE mg/dL
Specific Gravity, Urine: 1.015 (ref 1.005–1.030)
pH: 6.5 (ref 5.0–8.0)

## 2015-11-21 SURGERY — ARTHROPLASTY, KNEE, TOTAL
Anesthesia: General | Site: Knee | Laterality: Left

## 2015-11-21 MED ORDER — HYDROMORPHONE HCL 1 MG/ML IJ SOLN
1.0000 mg | INTRAMUSCULAR | Status: DC | PRN
  Administered 2015-11-21: 1 mg via INTRAVENOUS
  Filled 2015-11-21: qty 1

## 2015-11-21 MED ORDER — PHENOL 1.4 % MT LIQD: 1.0000 | OROMUCOSAL | Status: DC | PRN

## 2015-11-21 MED ORDER — SILDENAFIL CITRATE 20 MG PO TABS: 20.0000 mg | ORAL_TABLET | ORAL | Status: DC | PRN

## 2015-11-21 MED ORDER — MENTHOL 3 MG MT LOZG: 1.0000 | LOZENGE | OROMUCOSAL | Status: DC | PRN

## 2015-11-21 MED ORDER — LISINOPRIL 5 MG PO TABS
5.0000 mg | ORAL_TABLET | Freq: Every day | ORAL | Status: DC
  Filled 2015-11-21: qty 1

## 2015-11-21 MED ORDER — POLYMYXIN B SULFATE 500000 UNITS IJ SOLR
INTRAMUSCULAR | Status: DC | PRN
  Administered 2015-11-21: 500 mL

## 2015-11-21 MED ORDER — CELECOXIB 200 MG PO CAPS
200.0000 mg | ORAL_CAPSULE | Freq: Two times a day (BID) | ORAL | Status: DC
  Administered 2015-11-21 – 2015-11-23 (×4): 200 mg via ORAL
  Filled 2015-11-21 (×4): qty 1

## 2015-11-21 MED ORDER — CEFAZOLIN SODIUM-DEXTROSE 2-4 GM/100ML-% IV SOLN
2.0000 g | INTRAVENOUS | Status: AC
  Administered 2015-11-21: 2 g via INTRAVENOUS

## 2015-11-21 MED ORDER — FENTANYL CITRATE (PF) 100 MCG/2ML IJ SOLN
INTRAMUSCULAR | Status: DC | PRN
  Administered 2015-11-21: 50 ug via INTRAVENOUS
  Administered 2015-11-21: 100 ug via INTRAVENOUS
  Administered 2015-11-21: 50 ug via INTRAVENOUS

## 2015-11-21 MED ORDER — BISACODYL 5 MG PO TBEC
5.0000 mg | DELAYED_RELEASE_TABLET | Freq: Every day | ORAL | Status: DC | PRN
  Administered 2015-11-22: 5 mg via ORAL
  Filled 2015-11-21: qty 1

## 2015-11-21 MED ORDER — SODIUM CHLORIDE 0.9 % IR SOLN
Status: AC
  Filled 2015-11-21: qty 500000

## 2015-11-21 MED ORDER — LIDOCAINE 2% (20 MG/ML) 5 ML SYRINGE
INTRAMUSCULAR | Status: AC
  Filled 2015-11-21: qty 5

## 2015-11-21 MED ORDER — STERILE WATER FOR IRRIGATION IR SOLN
Status: DC | PRN
  Administered 2015-11-21: 1000 mL

## 2015-11-21 MED ORDER — PROPOFOL 10 MG/ML IV BOLUS
INTRAVENOUS | Status: DC | PRN
  Administered 2015-11-21: 150 mg via INTRAVENOUS

## 2015-11-21 MED ORDER — METOPROLOL TARTRATE 25 MG PO TABS
25.0000 mg | ORAL_TABLET | Freq: Two times a day (BID) | ORAL | Status: DC
  Administered 2015-11-21 – 2015-11-23 (×4): 25 mg via ORAL
  Filled 2015-11-21 (×4): qty 1

## 2015-11-21 MED ORDER — ACETAMINOPHEN 650 MG RE SUPP: 650.0000 mg | Freq: Four times a day (QID) | RECTAL | Status: DC | PRN

## 2015-11-21 MED ORDER — SODIUM CHLORIDE 0.9 % IR SOLN
Status: DC | PRN
  Administered 2015-11-21: 1000 mL

## 2015-11-21 MED ORDER — HYDROMORPHONE HCL 1 MG/ML IJ SOLN
INTRAMUSCULAR | Status: AC
  Filled 2015-11-21: qty 1

## 2015-11-21 MED ORDER — LABETALOL HCL 5 MG/ML IV SOLN
INTRAVENOUS | Status: DC | PRN
  Administered 2015-11-21 (×2): 2.5 mg via INTRAVENOUS

## 2015-11-21 MED ORDER — TRANEXAMIC ACID 1000 MG/10ML IV SOLN
2000.0000 mg | Freq: Once | INTRAVENOUS | Status: DC
  Filled 2015-11-21: qty 20

## 2015-11-21 MED ORDER — LABETALOL HCL 5 MG/ML IV SOLN
INTRAVENOUS | Status: AC
  Filled 2015-11-21: qty 4

## 2015-11-21 MED ORDER — ONDANSETRON HCL 4 MG/2ML IJ SOLN
4.0000 mg | Freq: Four times a day (QID) | INTRAMUSCULAR | Status: DC | PRN
  Filled 2015-11-21: qty 2

## 2015-11-21 MED ORDER — ATORVASTATIN CALCIUM 20 MG PO TABS
20.0000 mg | ORAL_TABLET | Freq: Every day | ORAL | Status: DC
  Administered 2015-11-22 – 2015-11-23 (×2): 20 mg via ORAL
  Filled 2015-11-21 (×2): qty 1

## 2015-11-21 MED ORDER — BUPIVACAINE LIPOSOME 1.3 % IJ SUSP
20.0000 mL | Freq: Once | INTRAMUSCULAR | Status: DC
  Filled 2015-11-21: qty 20

## 2015-11-21 MED ORDER — CEFAZOLIN IN D5W 1 GM/50ML IV SOLN
1.0000 g | Freq: Four times a day (QID) | INTRAVENOUS | Status: AC
  Administered 2015-11-21 – 2015-11-22 (×2): 1 g via INTRAVENOUS
  Filled 2015-11-21 (×2): qty 50

## 2015-11-21 MED ORDER — LIDOCAINE HCL (CARDIAC) 20 MG/ML IV SOLN
INTRAVENOUS | Status: DC | PRN
  Administered 2015-11-21: 100 mg via INTRAVENOUS

## 2015-11-21 MED ORDER — FENTANYL CITRATE (PF) 100 MCG/2ML IJ SOLN
INTRAMUSCULAR | Status: AC
  Filled 2015-11-21: qty 2

## 2015-11-21 MED ORDER — ONDANSETRON HCL 4 MG/2ML IJ SOLN
INTRAMUSCULAR | Status: DC | PRN
  Administered 2015-11-21: 4 mg via INTRAVENOUS

## 2015-11-21 MED ORDER — OXYCODONE-ACETAMINOPHEN 5-325 MG PO TABS
2.0000 | ORAL_TABLET | ORAL | Status: DC | PRN
  Administered 2015-11-21 – 2015-11-23 (×6): 2 via ORAL
  Filled 2015-11-21 (×6): qty 2

## 2015-11-21 MED ORDER — HYDROMORPHONE HCL 1 MG/ML IJ SOLN
INTRAMUSCULAR | Status: DC | PRN
  Administered 2015-11-21 (×2): 1 mg via INTRAVENOUS
  Administered 2015-11-21 (×4): 0.5 mg via INTRAVENOUS

## 2015-11-21 MED ORDER — CEFAZOLIN SODIUM-DEXTROSE 2-4 GM/100ML-% IV SOLN
INTRAVENOUS | Status: AC
  Filled 2015-11-21: qty 100

## 2015-11-21 MED ORDER — METHOCARBAMOL 500 MG PO TABS
500.0000 mg | ORAL_TABLET | Freq: Four times a day (QID) | ORAL | Status: DC | PRN
  Administered 2015-11-22 – 2015-11-23 (×2): 500 mg via ORAL
  Filled 2015-11-21 (×2): qty 1

## 2015-11-21 MED ORDER — HYDROMORPHONE HCL 1 MG/ML IJ SOLN
0.2500 mg | INTRAMUSCULAR | Status: DC | PRN
  Administered 2015-11-21 (×4): 0.5 mg via INTRAVENOUS

## 2015-11-21 MED ORDER — ROCURONIUM BROMIDE 100 MG/10ML IV SOLN
INTRAVENOUS | Status: DC | PRN
  Administered 2015-11-21: 30 mg via INTRAVENOUS

## 2015-11-21 MED ORDER — ACETAMINOPHEN 325 MG PO TABS: 650.0000 mg | ORAL_TABLET | Freq: Four times a day (QID) | ORAL | Status: DC | PRN

## 2015-11-21 MED ORDER — POLYETHYLENE GLYCOL 3350 17 G PO PACK: 17.0000 g | PACK | Freq: Every day | ORAL | Status: DC | PRN

## 2015-11-21 MED ORDER — SUGAMMADEX SODIUM 200 MG/2ML IV SOLN
INTRAVENOUS | Status: AC
  Filled 2015-11-21: qty 2

## 2015-11-21 MED ORDER — SUCCINYLCHOLINE CHLORIDE 20 MG/ML IJ SOLN
INTRAMUSCULAR | Status: DC | PRN
  Administered 2015-11-21: 100 mg via INTRAVENOUS

## 2015-11-21 MED ORDER — RIVAROXABAN 10 MG PO TABS
10.0000 mg | ORAL_TABLET | Freq: Every day | ORAL | Status: DC
  Administered 2015-11-22 – 2015-11-23 (×2): 10 mg via ORAL
  Filled 2015-11-21 (×2): qty 1

## 2015-11-21 MED ORDER — PANTOPRAZOLE SODIUM 40 MG PO TBEC
40.0000 mg | DELAYED_RELEASE_TABLET | Freq: Every day | ORAL | Status: DC
  Administered 2015-11-22 – 2015-11-23 (×2): 40 mg via ORAL
  Filled 2015-11-21 (×2): qty 1

## 2015-11-21 MED ORDER — LACTATED RINGERS IV SOLN
INTRAVENOUS | Status: DC
  Administered 2015-11-22: 01:00:00 via INTRAVENOUS

## 2015-11-21 MED ORDER — BUPIVACAINE HCL (PF) 0.25 % IJ SOLN
INTRAMUSCULAR | Status: DC | PRN
Start: 2015-11-21 — End: 2015-11-21
  Administered 2015-11-21: 20 mL

## 2015-11-21 MED ORDER — SUGAMMADEX SODIUM 200 MG/2ML IV SOLN
INTRAVENOUS | Status: DC | PRN
  Administered 2015-11-21: 200 mg via INTRAVENOUS

## 2015-11-21 MED ORDER — CHLORHEXIDINE GLUCONATE 4 % EX LIQD: 60.0000 mL | Freq: Once | CUTANEOUS | Status: DC

## 2015-11-21 MED ORDER — BUPIVACAINE HCL (PF) 0.25 % IJ SOLN
INTRAMUSCULAR | Status: AC
Start: 2015-11-21 — End: 2015-11-21
  Filled 2015-11-21: qty 30

## 2015-11-21 MED ORDER — PROPOFOL 10 MG/ML IV BOLUS
INTRAVENOUS | Status: AC
  Filled 2015-11-21: qty 20

## 2015-11-21 MED ORDER — SODIUM CHLORIDE 0.9 % IJ SOLN
INTRAMUSCULAR | Status: DC | PRN
Start: 2015-11-21 — End: 2015-11-21
  Administered 2015-11-21: 20 mL

## 2015-11-21 MED ORDER — HYDROMORPHONE HCL 2 MG/ML IJ SOLN
INTRAMUSCULAR | Status: AC
  Filled 2015-11-21: qty 1

## 2015-11-21 MED ORDER — ONDANSETRON HCL 4 MG PO TABS: 4.0000 mg | ORAL_TABLET | Freq: Four times a day (QID) | ORAL | Status: DC | PRN

## 2015-11-21 MED ORDER — METOCLOPRAMIDE HCL 5 MG/ML IJ SOLN: 10.0000 mg | Freq: Once | INTRAMUSCULAR | Status: DC | PRN

## 2015-11-21 MED ORDER — FERROUS SULFATE 325 (65 FE) MG PO TABS
325.0000 mg | ORAL_TABLET | Freq: Three times a day (TID) | ORAL | Status: DC
  Administered 2015-11-21 – 2015-11-23 (×5): 325 mg via ORAL
  Filled 2015-11-21 (×5): qty 1

## 2015-11-21 MED ORDER — METHOCARBAMOL 1000 MG/10ML IJ SOLN
500.0000 mg | Freq: Four times a day (QID) | INTRAVENOUS | Status: DC | PRN
  Administered 2015-11-21: 500 mg via INTRAVENOUS
  Filled 2015-11-21: qty 550
  Filled 2015-11-21: qty 5

## 2015-11-21 MED ORDER — TRANEXAMIC ACID 1000 MG/10ML IV SOLN
INTRAVENOUS | Status: DC | PRN
  Administered 2015-11-21: 2000 mg via TOPICAL

## 2015-11-21 MED ORDER — LACTATED RINGERS IV SOLN
INTRAVENOUS | Status: DC
  Administered 2015-11-21 (×2): via INTRAVENOUS

## 2015-11-21 MED ORDER — ONDANSETRON HCL 4 MG/2ML IJ SOLN
INTRAMUSCULAR | Status: AC
  Filled 2015-11-21: qty 2

## 2015-11-21 MED ORDER — FENTANYL CITRATE (PF) 100 MCG/2ML IJ SOLN: 25.0000 ug | INTRAMUSCULAR | Status: DC | PRN

## 2015-11-21 MED ORDER — ALUM & MAG HYDROXIDE-SIMETH 200-200-20 MG/5ML PO SUSP: 30.0000 mL | ORAL | Status: DC | PRN

## 2015-11-21 MED ORDER — BUPIVACAINE LIPOSOME 1.3 % IJ SUSP
INTRAMUSCULAR | Status: DC | PRN
  Administered 2015-11-21: 20 mL

## 2015-11-21 MED ORDER — MEPERIDINE HCL 50 MG/ML IJ SOLN: 6.2500 mg | INTRAMUSCULAR | Status: DC | PRN

## 2015-11-21 MED ORDER — SODIUM CHLORIDE 0.9 % IJ SOLN
INTRAMUSCULAR | Status: AC
  Filled 2015-11-21: qty 50

## 2015-11-21 MED ORDER — HYDROCODONE-ACETAMINOPHEN 5-325 MG PO TABS
1.0000 | ORAL_TABLET | ORAL | Status: DC | PRN
  Administered 2015-11-21 – 2015-11-23 (×3): 2 via ORAL
  Filled 2015-11-21 (×3): qty 2

## 2015-11-21 MED ORDER — FLEET ENEMA 7-19 GM/118ML RE ENEM: 1.0000 | ENEMA | Freq: Once | RECTAL | Status: DC | PRN

## 2015-11-21 MED ORDER — ROCURONIUM BROMIDE 10 MG/ML (PF) SYRINGE
PREFILLED_SYRINGE | INTRAVENOUS | Status: AC
  Filled 2015-11-21: qty 10

## 2015-11-21 SURGICAL SUPPLY — 67 items
AGENT HMST SPONGE THK3/8 (HEMOSTASIS) ×1
BAG DECANTER FOR FLEXI CONT (MISCELLANEOUS) ×2 IMPLANT
BAG SPEC THK2 15X12 ZIP CLS (MISCELLANEOUS)
BAG ZIPLOCK 12X15 (MISCELLANEOUS) IMPLANT
BANDAGE ACE 4X5 VEL STRL LF (GAUZE/BANDAGES/DRESSINGS) ×2 IMPLANT
BANDAGE ACE 6X5 VEL STRL LF (GAUZE/BANDAGES/DRESSINGS) ×2 IMPLANT
BLADE SAG 18X100X1.27 (BLADE) ×2 IMPLANT
BLADE SAW SGTL 11.0X1.19X90.0M (BLADE) ×2 IMPLANT
BNDG CMPR 82X61 PLY HI ABS (GAUZE/BANDAGES/DRESSINGS) ×1
BNDG COHESIVE 4X5 TAN NS LF (GAUZE/BANDAGES/DRESSINGS) ×4 IMPLANT
BNDG CONFORM 6X.82 1P STRL (GAUZE/BANDAGES/DRESSINGS) ×2 IMPLANT
BONE CEMENT GENTAMICIN (Cement) ×4 IMPLANT
CAP KNEE TOTAL 3 SIGMA ×2 IMPLANT
CEMENT BONE GENTAMICIN 40 (Cement) ×2 IMPLANT
CLOTH BEACON ORANGE TIMEOUT ST (SAFETY) ×2 IMPLANT
CUFF TOURN SGL QUICK 34 (TOURNIQUET CUFF) ×2
CUFF TRNQT CYL 34X4X40X1 (TOURNIQUET CUFF) ×1 IMPLANT
DECANTER SPIKE VIAL GLASS SM (MISCELLANEOUS) ×2 IMPLANT
DRAPE INCISE IOBAN 66X45 STRL (DRAPES) ×2 IMPLANT
DRAPE U-SHAPE 47X51 STRL (DRAPES) ×2 IMPLANT
DRSG ADAPTIC 3X8 NADH LF (GAUZE/BANDAGES/DRESSINGS) ×2 IMPLANT
DRSG PAD ABDOMINAL 8X10 ST (GAUZE/BANDAGES/DRESSINGS) ×4 IMPLANT
DURAPREP 26ML APPLICATOR (WOUND CARE) ×2 IMPLANT
ELECT REM PT RETURN 9FT ADLT (ELECTROSURGICAL) ×2
ELECTRODE REM PT RTRN 9FT ADLT (ELECTROSURGICAL) ×1 IMPLANT
EVACUATOR 1/8 PVC DRAIN (DRAIN) ×2 IMPLANT
FACESHIELD WRAPAROUND (MASK) ×2 IMPLANT
GAUZE SPONGE 4X4 12PLY STRL (GAUZE/BANDAGES/DRESSINGS) ×2 IMPLANT
GLOVE BIOGEL PI IND STRL 6.5 (GLOVE) ×1 IMPLANT
GLOVE BIOGEL PI IND STRL 8 (GLOVE) ×1 IMPLANT
GLOVE BIOGEL PI INDICATOR 6.5 (GLOVE) ×1
GLOVE BIOGEL PI INDICATOR 8 (GLOVE) ×1
GLOVE ECLIPSE 8.0 STRL XLNG CF (GLOVE) ×4 IMPLANT
GLOVE SURG SS PI 6.5 STRL IVOR (GLOVE) ×2 IMPLANT
GOWN STRL REUS W/TWL LRG LVL3 (GOWN DISPOSABLE) ×2 IMPLANT
GOWN STRL REUS W/TWL XL LVL3 (GOWN DISPOSABLE) ×2 IMPLANT
HANDPIECE INTERPULSE COAX TIP (DISPOSABLE) ×2
HEMOSTAT SPONGE AVITENE ULTRA (HEMOSTASIS) ×2 IMPLANT
IMMOBILIZER KNEE 20 (SOFTGOODS) ×2
IMMOBILIZER KNEE 20 THIGH 36 (SOFTGOODS) ×1 IMPLANT
MANIFOLD NEPTUNE II (INSTRUMENTS) ×2 IMPLANT
NEEDLE HYPO 21X1.5 SAFETY (NEEDLE) ×2 IMPLANT
NEEDLE HYPO 22GX1.5 SAFETY (NEEDLE) ×2 IMPLANT
NS IRRIG 1000ML POUR BTL (IV SOLUTION) IMPLANT
PACK TOTAL KNEE CUSTOM (KITS) ×2 IMPLANT
PAD ABD 8X10 STRL (GAUZE/BANDAGES/DRESSINGS) ×2 IMPLANT
PENCIL SMOKE EVAC W/HOLSTER (ELECTROSURGICAL) ×2 IMPLANT
POSITIONER SURGICAL ARM (MISCELLANEOUS) ×2 IMPLANT
SET HNDPC FAN SPRY TIP SCT (DISPOSABLE) ×1 IMPLANT
SET PAD KNEE POSITIONER (MISCELLANEOUS) ×2 IMPLANT
SPONGE LAP 18X18 X RAY DECT (DISPOSABLE) IMPLANT
STRIP CLOSURE SKIN 1/2X4 (GAUZE/BANDAGES/DRESSINGS) ×4 IMPLANT
SUT BONE WAX W31G (SUTURE) ×2 IMPLANT
SUT MNCRL AB 4-0 PS2 18 (SUTURE) ×2 IMPLANT
SUT VIC AB 1 CT1 27 (SUTURE) ×4
SUT VIC AB 1 CT1 27XBRD ANTBC (SUTURE) ×2 IMPLANT
SUT VIC AB 2-0 CT1 27 (SUTURE) ×6
SUT VIC AB 2-0 CT1 TAPERPNT 27 (SUTURE) ×3 IMPLANT
SUT VLOC 180 0 24IN GS25 (SUTURE) ×4 IMPLANT
SYR 20CC LL (SYRINGE) ×4 IMPLANT
SYRINGE 60CC LL (MISCELLANEOUS) ×4 IMPLANT
TAPE STRIPS DRAPE STRL (GAUZE/BANDAGES/DRESSINGS) ×2 IMPLANT
TOWER CARTRIDGE SMART MIX (DISPOSABLE) ×2 IMPLANT
TRAY FOLEY W/METER SILVER 16FR (SET/KITS/TRAYS/PACK) ×2 IMPLANT
WATER STERILE IRR 1500ML POUR (IV SOLUTION) ×2 IMPLANT
WRAP KNEE MAXI GEL POST OP (GAUZE/BANDAGES/DRESSINGS) ×2 IMPLANT
YANKAUER SUCT BULB TIP 10FT TU (MISCELLANEOUS) ×2 IMPLANT

## 2015-11-21 NOTE — Brief Op Note (Signed)
11/21/2015  12:44 PM  PATIENT:  [name]  80 y.o. male  PRE-OPERATIVE DIAGNOSIS:  LEFT KNEE Primary  OA  POST-OPERATIVE DIAGNOSIS:  LEFT Primary  KNEE OA with Flexion Contracture  PROCEDURE:  Procedure(s): LEFT TOTAL KNEE ARTHROPLASTY (Left) and release of Flexion Contracture.  SURGEON:  Surgeon(s) and Role:    * Latanya Maudlin, MD - Primary  PHYSICIAN ASSISTANT: Ardeen Jourdain PA  ASSISTANTS: Ardeen Jourdain PA   ANESTHESIA:   general  EBL:  Total I/O In: -  Out: 225 [Urine:200; Blood:25]  BLOOD ADMINISTERED:none  DRAINS: (One) Hemovact drain(s) in the Left Knee with  Suction Open   LOCAL MEDICATIONS USED:  MARCAINE 20cc of 0.25% Plain and 20cc of Exparel mixed with 20cc of Normal Saline    SPECIMEN:  No Specimen  DISPOSITION OF SPECIMEN:  N/A  COUNTS:  YES  TOURNIQUET:  * Missing tourniquet times found for documented tourniquets in log:  [id number] *  DICTATION: .Other Dictation: Dictation Number [id number]  PLAN OF CARE: Admit to inpatient   PATIENT DISPOSITION:  Stable in OR   Delay start of Pharmacological VTE agent (>24hrs) due to surgical blood loss or risk of bleeding: yes

## 2015-11-21 NOTE — Anesthesia Preprocedure Evaluation (Signed)
Anesthesia Evaluation  Patient identified by MRN, date of birth, ID band Patient awake    Reviewed: Allergy & Precautions, NPO status , Patient's Chart, lab work & pertinent test results  Airway Mallampati: II  TM Distance: >3 FB Neck ROM: Full    Dental no notable dental hx.    Pulmonary sleep apnea and Continuous Positive Airway Pressure Ventilation , former smoker,    Pulmonary exam normal breath sounds clear to auscultation       Cardiovascular hypertension, Pt. on medications + CAD, + Past MI, + CABG (1996) and +CHF  Normal cardiovascular exam+ Cardiac Defibrillator  Rhythm:Regular Rate:Normal     Neuro/Psych negative neurological ROS  negative psych ROS   GI/Hepatic negative GI ROS, Neg liver ROS,   Endo/Other  negative endocrine ROS  Renal/GU Renal InsufficiencyRenal diseasenegative Renal ROS  negative genitourinary   Musculoskeletal negative musculoskeletal ROS (+)   Abdominal   Peds negative pediatric ROS (+)  Hematology negative hematology ROS (+)   Anesthesia Other Findings   Reproductive/Obstetrics negative OB ROS                            Anesthesia Physical Anesthesia Plan  ASA: III  Anesthesia Plan: General   Post-op Pain Management:    Induction: Intravenous  Airway Management Planned: Oral ETT  Additional Equipment:   Intra-op Plan:   Post-operative Plan: Extubation in OR  Informed Consent: I have reviewed the patients History and Physical, chart, labs and discussed the procedure including the risks, benefits and alternatives for the proposed anesthesia with the patient or authorized representative who has indicated his/her understanding and acceptance.   Dental advisory given  Plan Discussed with: CRNA  Anesthesia Plan Comments:         Anesthesia Quick Evaluation

## 2015-11-21 NOTE — Anesthesia Postprocedure Evaluation (Signed)
Anesthesia Post Note  Patient: [name]  Procedure(s) Performed: Procedure(s) (LRB): LEFT TOTAL KNEE ARTHROPLASTY (Left)  Patient location during evaluation: PACU Anesthesia Type: General Level of consciousness: awake and alert Pain management: pain level controlled Vital Signs Assessment: post-procedure vital signs reviewed and stable Respiratory status: spontaneous breathing, nonlabored ventilation, respiratory function stable and patient connected to nasal cannula oxygen Cardiovascular status: blood pressure returned to baseline and stable Postop Assessment: no signs of nausea or vomiting Anesthetic complications: no    Last Vitals:  Vitals:   11/21/15 0918 11/21/15 1317  BP: 115/64 (!) 162/84  Pulse: 65 92  Resp: 18 14  Temp: 36.8 C     Last Pain:  Vitals:   11/21/15 0918  TempSrc: Oral                 Montez Hageman

## 2015-11-21 NOTE — Addendum Note (Signed)
Addendum  created 11/21/15 1352 by Montez Hageman, MD   Order sets accessed

## 2015-11-21 NOTE — Interval H&P Note (Signed)
History and Physical Interval Note:  11/21/2015 10:33 AM  [name]  has presented today for surgery, with the diagnosis of LEFT KNEE OA  The various methods of treatment have been discussed with the patient and family. After consideration of risks, benefits and other options for treatment, the patient has consented to  Procedure(s): LEFT TOTAL KNEE ARTHROPLASTY (Left) as a surgical intervention .  The patient's history has been reviewed, patient examined, no change in status, stable for surgery.  I have reviewed the patient's chart and labs.  Questions were answered to the patient's satisfaction.     GIOFFRE,RONALD A

## 2015-11-21 NOTE — Transfer of Care (Signed)
Immediate Anesthesia Transfer of Care Note  Patient: [name]  Procedure(s) Performed: Procedure(s): LEFT TOTAL KNEE ARTHROPLASTY (Left)  Patient Location: PACU  Anesthesia Type:General  Level of Consciousness:  sedated, patient cooperative and responds to stimulation  Airway & Oxygen Therapy:Patient Spontanous Breathing and Patient connected to face mask oxgen  Post-op Assessment:  Report given to PACU RN and Post -op Vital signs reviewed and stable  Post vital signs:  Reviewed and stable  Last Vitals:  Vitals:   11/21/15 0918  BP: 115/64  Pulse: 65  Resp: 18  Temp: [id number] C    Complications: No apparent anesthesia complications

## 2015-11-21 NOTE — Anesthesia Procedure Notes (Signed)
Procedure Name: Intubation Date/Time: 11/21/2015 11:07 AM Performed by: Maxwell Caul Pre-anesthesia Checklist: Patient identified, Emergency Drugs available, Suction available and Patient being monitored Patient Re-evaluated:Patient Re-evaluated prior to inductionOxygen Delivery Method: Circle system utilized Preoxygenation: Pre-oxygenation with 100% oxygen Intubation Type: IV induction Ventilation: Mask ventilation without difficulty Laryngoscope Size: Mac and 4 Grade View: Grade I Tube type: Oral Tube size: 7.5 mm Number of attempts: 1 Airway Equipment and Method: Stylet Placement Confirmation: ETT inserted through vocal cords under direct vision,  positive ETCO2 and breath sounds checked- equal and bilateral Secured at: 22 cm Tube secured with: Tape Dental Injury: Teeth and Oropharynx as per pre-operative assessment

## 2015-11-22 ENCOUNTER — Encounter (HOSPITAL_COMMUNITY): Payer: Self-pay | Admitting: Orthopedic Surgery

## 2015-11-22 LAB — CBC
HCT: 37.5 % — ABNORMAL LOW (ref 39.0–52.0)
Hemoglobin: 12.4 g/dL — ABNORMAL LOW (ref 13.0–17.0)
MCH: 30.8 pg (ref 26.0–34.0)
MCHC: 33.1 g/dL (ref 30.0–36.0)
MCV: 93.3 fL (ref 78.0–100.0)
Platelets: 310 10*3/uL (ref 150–400)
RBC: 4.02 MIL/uL — ABNORMAL LOW (ref 4.22–5.81)
RDW: 14.4 % (ref 11.5–15.5)
WBC: 11.5 10*3/uL — ABNORMAL HIGH (ref 4.0–10.5)

## 2015-11-22 LAB — BASIC METABOLIC PANEL
Anion gap: 6 (ref 5–15)
BUN: 20 mg/dL (ref 6–20)
CO2: 24 mmol/L (ref 22–32)
Calcium: 8.5 mg/dL — ABNORMAL LOW (ref 8.9–10.3)
Chloride: 103 mmol/L (ref 101–111)
Creatinine, Ser: 1.53 mg/dL — ABNORMAL HIGH (ref 0.61–1.24)
GFR calc Af Amer: 49 mL/min — ABNORMAL LOW (ref >60)
GFR calc non Af Amer: 42 mL/min — ABNORMAL LOW (ref >60)
Glucose, Bld: 127 mg/dL — ABNORMAL HIGH (ref 65–99)
Potassium: 4.3 mmol/L (ref 3.5–5.1)
Sodium: 133 mmol/L — ABNORMAL LOW (ref 135–145)

## 2015-11-22 LAB — URINE CULTURE: Culture: NO GROWTH

## 2015-11-22 NOTE — Progress Notes (Signed)
OT  Note  Patient Details Name: [name] MRN: [id number] DOB: Jun 24, 1935   Cancelled Treatment:    Reason Eval/Treat Not Completed: Other (comment) OT role explained- will perform OT eval/education in morning prior to DC  [facility], [name] 11/22/2015, 1:32 PM

## 2015-11-22 NOTE — Care Management Note (Signed)
Case Management Note  Patient Details  Name: [name] MRN: [id number] Date of Birth: 02/03/1936  Subjective/Objective:                  LEFT TOTAL KNEE ARTHROPLASTY (Left) Action/Plan: Discharge planning Expected Discharge Date: 11/23/15              Expected Discharge Plan:  Home/Self Care  In-House Referral:     Discharge planning Services  CM Consult  Post Acute Care Choice:  NA Choice offered to:  Patient  DME Arranged:  Gilford Rile rolling DME Agency:  [place]:  NA [place] Agency:  NA  Status of Service:  Completed, signed off  If discussed at [place] of Stay Meetings, dates discussed:    Additional Comments: Cm met with pt to confirm plan is for outpt PT; pt confirms he will begin outpt PT on Monday, October 9.  CM notified Reggie to please deliver the rolling walker to room prior to discharge.  No other CM needs were communicated. Dellie Catholic, RN 11/22/2015, 1:16 PM

## 2015-11-22 NOTE — Op Note (Signed)
[name], [name] NO.:  [id number]  MEDICAL RECORD NO.:  [id number]  LOCATION:  [id number]                         FACILITY:  [facility]  PHYSICIAN:  Kipp Brood. Gioffre, M.D.DATE OF BIRTH:  [birth year]  DATE OF PROCEDURE:  11/21/2015 DATE OF DISCHARGE:                              OPERATIVE REPORT   SURGEON:  Kipp Brood. Gladstone Lighter, M.D.  ASSISTANT:  Ardeen Jourdain, [place].  PREOPERATIVE DIAGNOSES: 1. Primary osteoarthritis of the left knee with bone-on-bone. 2. Flexion contracture, left knee.  OPERATION:  Left total knee arthroplasty utilizing DePuy system.  The sizes used were as a size 5 femoral component, size 4 tray.  The femoral component was a left posterior cruciate sacrificing type.  The femoral tray was a size 4.  The insert was a size 5, 10-mm thickness rotating platform polyethylene insert.  The patella was a size 41 with 3 pegs. All three components were cemented and gentamicin was used in the cement.  DESCRIPTION OF PROCEDURE:  The appropriate time-out was first carried out.  Then, also I marked the appropriate left leg in the holding area. At this time, the left leg was exsanguinated with Esmarch, tourniquet was elevated to 325 mmHg.  Following that, the leg then was placed in the [facility] knee holder and flexed.  I did an anterior approach of the knee.  Bleeders were identified and cauterized.  Two flaps were created. I then did a median parapatellar incision, reflected the patella laterally.  I then did medial and lateral meniscectomies and excised the anterior, posterior and cruciate ligaments.  At this time, we then directed attention to the femoral notch.  Drill hole was easily made in the notch.  Canal finder was inserted.  We were nice and thoroughly up into the canal.  We removed that, thoroughly irrigated out the femoral canal.  We then removed 12-mm thickness off the distal femur because of the marked overgrowth and the contraction.  At this  particular time, I then sized the femur to be a size 5 left.  I then applied my next jig and did the anterior, posterior and chamfering cuts for size 5 left femoral component.  Next, attention was directed to tibia plateau.  I removed the spinous processes with oscillating saw.  I then measured the tibial tray to be a size 4.  We then made our initial drill hole into the tibial plateau.  At this particular time, we then removed 6-mm thickness off the affected medial side.  We then inserted our lamina spreaders and removed the posterior spurs from the medial, lateral and femoral condyles.  We then inserted our spacer blocks for size 10, had an excellent fit.  Good medial and lateral stability.  We then went on and continued to prepare the tibial plateau.  Appropriate keel cut was made to tibial plateau.  We then did our notch cut, the distal femur. The trial components were inserted and we had a nice fit with a 10-mm thickness insert.  At that time, we then did a resurfacing procedure on the patella and three drill holes were made into the articular surface. The size of the patella was size 41.  The trial component was inserted,  we had a nice fit.  Following that, we then removed all trial components, thoroughly water picked out the knee and cemented all three components in simultaneously.  At this particular time, we then let the cement dry and then removed all loose pieces of cement.  We then water picked the knee out to make sure there were no other loose pieces of cement present.  Following that, we then went on and made sure that we thoroughly debrided the excessive adipose tissue there from our inferior patella region.  We then removed the trial tibial component and inserted our permanent tray a size 5, 10-mm thickness polyethylene rotating platform, reduced the knee and had excellent function.  Good flexion, extension.  Good medial and lateral stability.  We then closed the  wound layers in usual fashion over Hemovac drain after we irrigated again with antibiotic solution.  The remaining part of the wound was closed in usual fashion.  During the procedure, I injected 20 mL of 0.25% plain Marcaine into the soft tissue region and then we used Exparel 20 mL with 20 mL of saline toward the end.  As I said, the wound was closed in usual fashion with a running locking Monocryl suture, sterile dressings were applied.  Prior to surgery, a 2 g of IV Ancef and he will get his tranexamic acid topically.          ______________________________ Kipp Brood Gladstone Lighter, M.D.     RAG/MEDQ  D:  11/21/2015  T:  11/22/2015  Job:  [id number]

## 2015-11-22 NOTE — Progress Notes (Signed)
Physical Therapy Treatment Patient Details Name: [name] MRN: [id number] DOB: 04-Jun-1935 Today's Date: 11/22/2015    History of Present Illness 80 yo male s/p L TKA 10/4    PT Comments    Progressing with mobility.   Follow Up Recommendations  Outpatient PT;Supervision/Assistance - 24 hour     Equipment Recommendations  Rolling walker with 5" wheels    Recommendations for Other Services OT consult     Precautions / Restrictions Precautions Precautions: Fall Required Braces or Orthoses: Knee Immobilizer - Left Knee Immobilizer - Left: Discontinue once straight leg raise with < 10 degree lag Restrictions Weight Bearing Restrictions: No LLE Weight Bearing: Weight bearing as tolerated    Mobility  Bed Mobility Overal bed mobility: Needs Assistance Bed Mobility: Sit to Supine     Supine to sit: HOB elevated;Min assist Sit to supine: Min assist   General bed mobility comments: Assist for L LE.  Transfers Overall transfer level: Needs assistance Equipment used: Rolling walker (2 wheeled) Transfers: Sit to/from Stand Sit to Stand: Min assist;From elevated surface         General transfer comment: Assist to rise, stabilize, control descent. VCs safety, technique, hand/LE placement.   Ambulation/Gait Ambulation/Gait assistance: Min assist Ambulation Distance (Feet): 90 Feet Assistive device: Rolling walker (2 wheeled) Gait Pattern/deviations: Step-to pattern;Antalgic     General Gait Details: VCs safety, technique, sequence. Assist to stabilize.    Stairs            Wheelchair Mobility    Modified Rankin (Stroke Patients Only)       Balance                                    Cognition Arousal/Alertness: Awake/alert Behavior During Therapy: WFL for tasks assessed/performed Overall Cognitive Status: Within Functional Limits for tasks assessed                      Exercises   General Comments         Pertinent Vitals/Pain Pain Assessment: 0-10 Pain Score: 6  Pain Location: L knee with activity Pain Descriptors / Indicators: Sore;Grimacing Pain Intervention(s): Monitored during session;Repositioned;Ice applied    Home Living Family/patient expects to be discharged to:: Private residence Living Arrangements: Spouse/significant other   Type of Home: House Home Access: Stairs to enter   Home Layout: One level Home Equipment: None      Prior Function Level of Independence: Independent          PT Goals (current goals can now be found in the care plan section) Acute Rehab PT Goals Patient Stated Goal: less pain. PT Goal Formulation: With patient/family Time For Goal Achievement: 11/22/15 Progress towards PT goals: Progressing toward goals    Frequency    7X/week      PT Plan Current plan remains appropriate    Co-evaluation             End of Session Equipment Utilized During Treatment: Gait belt;Left knee immobilizer Activity Tolerance: Patient tolerated treatment well Patient left: in bed;with call bell/phone within reach;with bed alarm set;with family/visitor present     Time: [id number] PT Time Calculation (min) (ACUTE ONLY): 15 min  Charges:  $Gait Training: 8-22 mins                    G Codes:      [name], MPT Pager: [phone]

## 2015-11-22 NOTE — Progress Notes (Signed)
Subjective: 1 Day Post-Op Procedure(s) (LRB): LEFT TOTAL KNEE ARTHROPLASTY (Left) Patient reports pain as 4 on 0-10 scale.  Hemovac DCd and dressing changed. Wound looks fine.Awaiting Urine culture taken in OR.  Objective: Vital signs in last 24 hours: Temp:  [97.5 F (36.4 C)-99.1 F (37.3 C)] 97.5 F (36.4 C) (10/05 0644) Pulse Rate:  [65-92] 73 (10/05 0644) Resp:  [11-19] 15 (10/05 0644) BP: (115-162)/(53-87) 135/64 (10/05 0644) SpO2:  [91 %-100 %] 96 % (10/05 0644)  Intake/Output from previous day: 10/04 0701 - 10/05 0700 In: 4338.7 [P.O.:720; I.V.:3568.7; IV Piggyback:50] Out: 1620 [id number]; Drains:20; Blood:25] Intake/Output this shift: No intake/output data recorded.   Recent Labs  11/22/15 0430  HGB 12.4*    Recent Labs  11/22/15 0430  WBC 11.5*  RBC 4.02*  HCT 37.5*  PLT 310    Recent Labs  11/22/15 0430  NA 133*  K 4.3  CL 103  CO2 24  BUN 20  CREATININE 1.53*  GLUCOSE 127*  CALCIUM 8.5*   No results for input(s): LABPT, INR in the last 72 hours.  Dorsiflexion/Plantar flexion intact No cellulitis present Compartment soft  Assessment/Plan: 1 Day Post-Op Procedure(s) (LRB): LEFT TOTAL KNEE ARTHROPLASTY (Left) Up with therapy.Waiting for Urine Culture. DC for tomorrow.  GIOFFRE,RONALD A 11/22/2015, 7:25 AM

## 2015-11-22 NOTE — Evaluation (Signed)
Physical Therapy Evaluation Patient Details Name: Taiton Arciero MRN: [id number] DOB: 1935/11/07 Today's Date: 11/22/2015   History of Present Illness  80 yo male s/p L TKA 10/4  Clinical Impression  On eval, pt required Min assist for mobility. He walked ~50 feet with a RW. Pain rated 7/10 with activity. Will follow and progress activity as tolerated.    Follow Up Recommendations Outpatient PT;Supervision/Assistance - 24 hour    Equipment Recommendations  Rolling walker with 5" wheels    Recommendations for Other Services OT consult     Precautions / Restrictions Precautions Precautions: Fall Required Braces or Orthoses: Knee Immobilizer - Left Knee Immobilizer - Left: Discontinue once straight leg raise with < 10 degree lag Restrictions Weight Bearing Restrictions: No LLE Weight Bearing: Weight bearing as tolerated      Mobility  Bed Mobility Overal bed mobility: Needs Assistance Bed Mobility: Supine to Sit     Supine to sit: HOB elevated;Min assist     General bed mobility comments: Assist for L LE.  Transfers Overall transfer level: Needs assistance Equipment used: Rolling walker (2 wheeled) Transfers: Sit to/from Stand Sit to Stand: Min assist;From elevated surface         General transfer comment: Assist to rise, stabilize, control descent. VCs safety, technique, hand/LE placement.   Ambulation/Gait Ambulation/Gait assistance: Min assist Ambulation Distance (Feet): 50 Feet Assistive device: Rolling walker (2 wheeled) Gait Pattern/deviations: Step-to pattern;Antalgic     General Gait Details: VCs safety, technique, sequence. Assist to stabilize.   Stairs            Wheelchair Mobility    Modified Rankin (Stroke Patients Only)       Balance                                             Pertinent Vitals/Pain Pain Assessment: 0-10 Pain Score: 7  Pain Location: L knee with activity Pain Descriptors / Indicators:  Aching Pain Intervention(s): Limited activity within patient's tolerance    Home Living Family/patient expects to be discharged to:: Private residence Living Arrangements: Spouse/significant other   Type of Home: House Home Access: Stairs to enter   Technical brewer of Steps: 1+1 Home Layout: One level Home Equipment: None      Prior Function Level of Independence: Independent               Hand Dominance        Extremity/Trunk Assessment   Upper Extremity Assessment: Defer to OT evaluation           Lower Extremity Assessment: Generalized weakness      Cervical / Trunk Assessment: Normal  Communication   Communication: No difficulties  Cognition Arousal/Alertness: Awake/alert Behavior During Therapy: WFL for tasks assessed/performed Overall Cognitive Status: Within Functional Limits for tasks assessed                      General Comments      Exercises Total Joint Exercises Ankle Circles/Pumps: AROM;Both;10 reps;Supine Quad Sets: AROM;Both;10 reps;Supine Heel Slides: AAROM;Left;10 reps;Supine Hip ABduction/ADduction: AAROM;Left;10 reps;Supine Straight Leg Raises: AAROM;Left;10 reps;Supine Goniometric ROM: ~10-55 degrees   Assessment/Plan    PT Assessment Patient needs continued PT services  PT Problem List Decreased strength;Decreased range of motion;Decreased activity tolerance;Decreased balance;Pain;Decreased knowledge of use of DME          PT Treatment Interventions DME  instruction;Gait training;Stair training;Functional mobility training;Therapeutic activities;Therapeutic exercise;Patient/family education;Balance training    PT Goals (Current goals can be found in the Care Plan section)  Acute Rehab PT Goals Patient Stated Goal: less pain. PT Goal Formulation: With patient/family Time For Goal Achievement: 11/22/15    Frequency 7X/week   Barriers to discharge        Co-evaluation               End of  Session Equipment Utilized During Treatment: Gait belt;Left knee immobilizer Activity Tolerance: Patient tolerated treatment well Patient left: in chair;with call bell/phone within reach;with family/visitor present           Time: [id number] PT Time Calculation (min) (ACUTE ONLY): 33 min   Charges:   PT Evaluation $PT Eval Low Complexity: 1 Procedure PT Treatments $Gait Training: 8-22 mins   PT G Codes:        [name], MPT Pager: [phone]

## 2015-11-23 LAB — BASIC METABOLIC PANEL WITH GFR
Anion gap: 7 (ref 5–15)
BUN: 18 mg/dL (ref 6–20)
CO2: 25 mmol/L (ref 22–32)
Calcium: 8.6 mg/dL — ABNORMAL LOW (ref 8.9–10.3)
Chloride: 104 mmol/L (ref 101–111)
Creatinine, Ser: 1.47 mg/dL — ABNORMAL HIGH (ref 0.61–1.24)
GFR calc Af Amer: 51 mL/min — ABNORMAL LOW
GFR calc non Af Amer: 44 mL/min — ABNORMAL LOW
Glucose, Bld: 135 mg/dL — ABNORMAL HIGH (ref 65–99)
Potassium: 4.4 mmol/L (ref 3.5–5.1)
Sodium: 136 mmol/L (ref 135–145)

## 2015-11-23 LAB — CBC
HCT: 37.3 % — ABNORMAL LOW (ref 39.0–52.0)
Hemoglobin: 12.4 g/dL — ABNORMAL LOW (ref 13.0–17.0)
MCH: 30.9 pg (ref 26.0–34.0)
MCHC: 33.2 g/dL (ref 30.0–36.0)
MCV: 93 fL (ref 78.0–100.0)
Platelets: 325 10*3/uL (ref 150–400)
RBC: 4.01 MIL/uL — ABNORMAL LOW (ref 4.22–5.81)
RDW: 14.4 % (ref 11.5–15.5)
WBC: 12.9 10*3/uL — ABNORMAL HIGH (ref 4.0–10.5)

## 2015-11-23 MED ORDER — METHOCARBAMOL 500 MG PO TABS: 500.0000 mg | ORAL_TABLET | Freq: Four times a day (QID) | ORAL | 1 refills | Status: DC | PRN

## 2015-11-23 MED ORDER — OXYCODONE-ACETAMINOPHEN 5-325 MG PO TABS: 1.0000 | ORAL_TABLET | ORAL | 0 refills | Status: DC | PRN

## 2015-11-23 MED ORDER — ASPIRIN 325 MG PO TBEC: 325.0000 mg | DELAYED_RELEASE_TABLET | Freq: Two times a day (BID) | ORAL | 0 refills | Status: DC

## 2015-11-23 NOTE — Progress Notes (Signed)
Physical Therapy Treatment Patient Details Name: Adal Hansley MRN: [id number] DOB: 07/12/1935 Today's Date: 11/23/2015    History of Present Illness 80 yo male s/p L TKA 10/4    PT Comments    Progressing with mobility. Practiced/reviewed exercises, gait training, and step training. Issued HEP for pt to perform 2x/day until he begins OP PT. All education completed. Ready to d/c from PT standpoint.   Follow Up Recommendations  Outpatient PT;Supervision/Assistance - 24 hour     Equipment Recommendations  Rolling walker with 5" wheels    Recommendations for Other Services OT consult     Precautions / Restrictions Precautions Precautions: Fall Required Braces or Orthoses: Knee Immobilizer - Left Knee Immobilizer - Left: Discontinue once straight leg raise with < 10 degree lag Restrictions Weight Bearing Restrictions: No LLE Weight Bearing: Weight bearing as tolerated    Mobility  Bed Mobility Overal bed mobility: Needs Assistance Bed Mobility: Supine to Sit;Sit to Supine     Supine to sit: Min assist Sit to supine: Min assist   General bed mobility comments: Assist for L LE.  Transfers Overall transfer level: Needs assistance Equipment used: Rolling walker (2 wheeled) Transfers: Sit to/from Stand Sit to Stand: Min assist;From elevated surface         General transfer comment: VCs safety, technique, hand/LE placement. Assist to rise, stabilize.   Ambulation/Gait Ambulation/Gait assistance: Min assist Ambulation Distance (Feet): 75 Feet Assistive device: Rolling walker (2 wheeled) Gait Pattern/deviations: Step-to pattern     General Gait Details: VCs safety, technique, sequence. Intermittent assist to stabilize.    Stairs Stairs: Yes Stairs assistance: Min assist Stair Management: Step to pattern;Forwards;With walker Number of Stairs: 1 General stair comments: VCs safety, technique, sequence. Assist to stabilize.   Wheelchair Mobility    Modified  Rankin (Stroke Patients Only)       Balance                                    Cognition Arousal/Alertness: Awake/alert Behavior During Therapy: WFL for tasks assessed/performed Overall Cognitive Status: Within Functional Limits for tasks assessed                      Exercises Total Joint Exercises Ankle Circles/Pumps: AROM;Both;10 reps;Supine Quad Sets: AROM;Both;10 reps;Supine Heel Slides: AAROM;Left;10 reps;Supine Hip ABduction/ADduction: AAROM;Left;10 reps;Supine Straight Leg Raises: AAROM;Left;10 reps;Supine Goniometric ROM: ~10-60 degrees    General Comments        Pertinent Vitals/Pain Pain Assessment: 0-10 Pain Score: 5  Pain Location: L knee Pain Descriptors / Indicators: Aching;Grimacing Pain Intervention(s): Repositioned;Premedicated before session;Ice applied    Home Living Family/patient expects to be discharged to:: Private residence Living Arrangements: Spouse/significant other             Additional Comments: pt is a Chief Strategy Officer.  Put very high commodes in and also has an elevated toilet seat if needed. 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 Progress towards PT goals: Progressing toward goals    Frequency    7X/week      PT Plan Current plan remains appropriate    Co-evaluation  End of Session Equipment Utilized During Treatment: Gait belt;Left knee immobilizer Activity Tolerance: Patient tolerated treatment well Patient left: in bed;with call bell/phone within reach;with family/visitor present     Time: 1114-1140 PT Time Calculation (min) (ACUTE ONLY): 26 min  Charges:  $Gait Training: 8-22 mins $Therapeutic Exercise: 8-22 mins                    G Codes:      [name], MPT Pager:  [phone]

## 2015-11-23 NOTE — Evaluation (Signed)
Occupational Therapy Evaluation Patient Details Name: [name] MRN: [id number] DOB: 06-04-1935 Today's Date: 11/23/2015    History of Present Illness 80 yo male s/p L TKA 10/4   Clinical Impression   Pt was admitted for the above sx. All education was completed. No further OT is needed at this time    Follow Up Recommendations  No OT follow up    Equipment Recommendations  None recommended by OT    Recommendations for Other Services       Precautions / Restrictions Precautions Precautions: Fall Required Braces or Orthoses: Knee Immobilizer - Left Knee Immobilizer - Left: Discontinue once straight leg raise with < 10 degree lag Restrictions LLE Weight Bearing: Weight bearing as tolerated      Mobility Bed Mobility         Supine to sit: Min assist        Transfers   Equipment used: Rolling walker (2 wheeled) Transfers: Sit to/from Stand Sit to Stand: Min assist;Min guard         General transfer comment: min from bed; min guard from 3:1    Balance                                            ADL Overall ADL's : Needs assistance/impaired     Grooming: Set up;Sitting;Wash/dry hands;Wash/dry face   Upper Body Bathing: Set up;Sitting   Lower Body Bathing: Minimal assistance;Sit to/from stand   Upper Body Dressing : Set up;Sitting   Lower Body Dressing: Maximal assistance;Sit to/from stand   Toilet Transfer: Minimal assistance;Ambulation;BSC;RW;Min guard (min A for sit to stand from bed; min guard from 3:1)   Toileting- Clothing Manipulation and Hygiene: Min guard;Sit to/from stand         General ADL Comments: performed adl from bathroom. Educated on shower transfer:  pt did not wish to practice due to pain     Vision     Perception     Praxis      Pertinent Vitals/Pain Pain Score: 5  Pain Location: L knee Pain Descriptors / Indicators: Aching;Grimacing Pain Intervention(s): Limited activity within  patient's tolerance;Monitored during session;Premedicated before session;Repositioned;Ice applied;Patient requesting pain meds-RN notified     Hand Dominance     Extremity/Trunk Assessment Upper Extremity Assessment Upper Extremity Assessment: Overall WFL for tasks assessed           Communication Communication Communication: No difficulties   Cognition Arousal/Alertness: Awake/alert Behavior During Therapy: WFL for tasks assessed/performed Overall Cognitive Status: Within Functional Limits for tasks assessed                     General Comments       Exercises       Shoulder Instructions      Home Living Family/patient expects to be discharged to:: Private residence Living Arrangements: Spouse/significant other                 Bathroom Shower/Tub: Occupational psychologist: Handicapped height         Additional Comments: pt is a Chief Strategy Officer.  Put very high commodes in and also has an elevated toilet seat if needed. He is sure he has a chair that will work in shower      Prior Functioning/Environment Level of Independence: Independent  OT Problem List:     OT Treatment/Interventions:      OT Goals(Current goals can be found in the care plan section) Acute Rehab OT Goals Patient Stated Goal: less pain. OT Goal Formulation: All assessment and education complete, DC therapy  OT Frequency:     Barriers to D/C:            Co-evaluation              End of Session    Activity Tolerance: Patient tolerated treatment well Patient left: in chair;with call bell/phone within reach   Time: 0821-0858 OT Time Calculation (min): 37 min Charges:  OT General Charges $OT Visit: 1 Procedure OT Evaluation $OT Eval Low Complexity: 1 Procedure OT Treatments $Self Care/Home Management : 8-22 mins G-Codes:    [name] 30-Nov-2015, 9:47 AM [name], OTR/L [phone] November 30, 2015

## 2015-11-23 NOTE — Discharge Instructions (Signed)

## 2015-11-23 NOTE — Progress Notes (Signed)
Subjective: 2 Days Post-Op Procedure(s) (LRB): LEFT TOTAL KNEE ARTHROPLASTY (Left) Patient reports pain as moderate.   Patient seen in rounds with Dr. Gladstone Lighter. Patient is well, and has had no acute complaints or problems other than some discomfort in the left knee. No SOB or chest pain. Voiding well. No BM but positive flatus. Denies urinary complications.    Objective: Vital signs in last 24 hours: Temp:  [97.5 F (36.4 C)-98.7 F (37.1 C)] 98.7 F (37.1 C) (10/06 0613) Pulse Rate:  [66-73] 73 (10/06 0613) Resp:  [14-16] 16 (10/06 0613) BP: (108-123)/(45-69) 119/45 (10/06 0613) SpO2:  [93 %-95 %] 93 % (10/06 [id number])  Intake/Output from previous day:  Intake/Output Summary (Last 24 hours) at 11/23/15 0725 Last data filed at 11/23/15 [id number]  Gross per 24 hour  Intake             1380 ml  Output             3700 ml  Net            -2320 ml   Labs:  Recent Labs  11/22/15 0430 11/23/15 0446  HGB 12.4* 12.4*    Recent Labs  11/22/15 0430 11/23/15 0446  WBC 11.5* 12.9*  RBC 4.02* 4.01*  HCT 37.5* 37.3*  PLT 310 325    Recent Labs  11/22/15 0430 11/23/15 0446  NA 133* 136  K 4.3 4.4  CL 103 104  CO2 24 25  BUN 20 18  CREATININE 1.53* 1.47*  GLUCOSE 127* 135*  CALCIUM 8.5* 8.6*     EXAM General - Patient is Alert and Oriented Extremity - Neurologically intact Intact pulses distally Dorsiflexion/Plantar flexion intact No cellulitis present Compartment soft Dressing/Incision - clean, dry, no drainage Motor Function - intact, moving foot and toes well on exam.   Past Medical History:  Diagnosis Date  . AICD (automatic cardioverter/defibrillator) present   . Anemia, unspecified   . Arthritis   . BPH with obstruction/lower urinary tract symptoms   . CAD (coronary artery disease)    a. MI/CABG 1996. b. VF arrest 2008: cath with severe native 3v disease, 3/3 grafts widely patent, mild to moderate left ventricular systolic dysfunction secondary to  anteroapical scar. EF 40-45%.  . Cardiac arrest [facility]) 2008   2008: aborted cardiac arrest 2008 (c/b encephalopathy, VDRF, aspiriation PNA, CHF, elevated LFTs), s/p [employer] ICD placement.  . CHF (congestive heart failure) ([place])   . Chronic venous insufficiency   . CKD (chronic kidney disease), stage III    difficulty urinating seeing Dr Gaynelle Arabian on 11/13/2015 at 0800am  . Disorder of bone and cartilage, unspecified   . Erectile dysfunction       "           "      "              "                  "    . Essential hypertension   . GERD (gastroesophageal reflux disease)   . Hx of adenomatous colonic polyps 2003, 2013  . Hypercholesterolemia   . Hypogonadism male    managed by [facility] (Dr. Gaynelle Arabian)  . Ischemic cardiomyopathy    a. EF 40-45% by cath 2008.  EF 39% by nuclear study 2016  . Meniscal injury Jan/Feb 2016   Sustained shoveling snow; repaired by Dr. Gladstone Lighter   . Myocardial infarction   . Obesity   .  OSA on CPAP    sleep study 2008, severe OSA: CPAP 11 cm H2O  . Urinary tract infection    diagnosed on 11/13/2015     Assessment/Plan: 2 Days Post-Op Procedure(s) (LRB): LEFT TOTAL KNEE ARTHROPLASTY (Left) Active Problems:   Status post total left knee replacement  Estimated body mass index is 31.06 kg/m as calculated from the following:   Height as of 11/12/15: 6' (1.829 m).   Weight as of 11/12/15: 103.9 kg (229 lb). Advance diet Up with therapy Discharge home with home health  DVT Prophylaxis - Xarelto Weight-Bearing as tolerated   Patient doing well. Continue with therapy this morning. Plan for DC home with HHPT this afternoon. Will DC on aspirin as opposed to Xarelto. Urine culture negative. Discharge instructions discussed.   Ardeen Jourdain, PA-C Orthopaedic Surgery 11/23/2015, 7:25 AM

## 2015-11-26 DIAGNOSIS — M25662 Stiffness of left knee, not elsewhere classified: Secondary | ICD-10-CM | POA: Diagnosis not present

## 2015-11-26 NOTE — Discharge Summary (Signed)
Physician Discharge Summary   Patient ID: [name] MRN: [id number] DOB/AGE: 1935/10/09 80 y.o.  Admit date: 11/21/2015 Discharge date: 11/23/2015  Primary Diagnosis: Primary osteoarthritis left knee   Admission Diagnoses:  Past Medical History:  Diagnosis Date  . AICD (automatic cardioverter/defibrillator) present   . Anemia, unspecified   . Arthritis   . BPH with obstruction/lower urinary tract symptoms   . CAD (coronary artery disease)    a. MI/CABG 1996. b. VF arrest 2008: cath with severe native 3v disease, 3/3 grafts widely patent, mild to moderate left ventricular systolic dysfunction secondary to anteroapical scar. EF 40-45%.  . Cardiac arrest [facility]) 2008   2008: aborted cardiac arrest 2008 (c/b encephalopathy, VDRF, aspiriation PNA, CHF, elevated LFTs), s/p [employer] ICD placement.  . CHF (congestive heart failure) ([place])   . Chronic venous insufficiency   . CKD (chronic kidney disease), stage III    difficulty urinating seeing Dr Gaynelle Arabian on 11/13/2015 at 0800am  . Disorder of bone and cartilage, unspecified   . Erectile dysfunction       "           "      "              "                  "    . Essential hypertension   . GERD (gastroesophageal reflux disease)   . Hx of adenomatous colonic polyps 2003, 2013  . Hypercholesterolemia   . Hypogonadism male    managed by [facility] (Dr. Gaynelle Arabian)  . Ischemic cardiomyopathy    a. EF 40-45% by cath 2008.  EF 39% by nuclear study 2016  . Meniscal injury Jan/Feb 2016   Sustained shoveling snow; repaired by Dr. Gladstone Lighter   . Myocardial infarction   . Obesity   . OSA on CPAP    sleep study 2008, severe OSA: CPAP 11 cm H2O  . Urinary tract infection    diagnosed on 11/13/2015    Discharge Diagnoses:   Active Problems:   Status post total left knee replacement  Estimated body mass index is 31.06 kg/m as calculated from the following:   Height as of 11/12/15: 6' (1.829 m).   Weight as of 11/12/15: 103.9 kg (229  lb).  Procedure:  Procedure(s) (LRB): LEFT TOTAL KNEE ARTHROPLASTY (Left)   Consults: None  HPI: [name], 80 y.o. male, has a history of pain and functional disability in the left knee due to arthritis and has failed non-surgical conservative treatments for greater than 12 weeks to includeNSAID's and/or analgesics, corticosteriod injections, flexibility and strengthening excercises and activity modification.  Onset of symptoms was gradual, starting 8 years ago with gradually worsening course since that time. The patient noted prior procedures on the knee to include  arthroscopy and menisectomy on the left knee(s).  Patient currently rates pain in the left knee(s) at 8 out of 10 with activity. Patient has night pain, worsening of pain with activity and weight bearing, pain that interferes with activities of daily living, pain with passive range of motion, crepitus and joint swelling.  Patient has evidence of periarticular osteophytes and joint space narrowing by imaging studies. There is no active infection.  Laboratory Data: Admission on 11/21/2015, Discharged on 11/23/2015  Component Date Value Ref Range Status  . Glucose-Capillary 11/21/2015 142* 65 - 99 mg/dL Final  . Color, Urine 11/21/2015 YELLOW  YELLOW Final  . APPearance 11/21/2015 CLEAR  CLEAR Final  .  Specific Gravity, Urine 11/21/2015 1.015  1.005 - 1.030 Final  . pH 11/21/2015 6.5  5.0 - 8.0 Final  . Glucose, UA 11/21/2015 NEGATIVE  NEGATIVE mg/dL Final  . Hgb urine dipstick 11/21/2015 NEGATIVE  NEGATIVE Final  . Bilirubin Urine 11/21/2015 NEGATIVE  NEGATIVE Final  . Ketones, ur 11/21/2015 NEGATIVE  NEGATIVE mg/dL Final  . Protein, ur 11/21/2015 NEGATIVE  NEGATIVE mg/dL Final  . Nitrite 11/21/2015 NEGATIVE  NEGATIVE Final  . Leukocytes, UA 11/21/2015 NEGATIVE  NEGATIVE Final  . Specimen Description 11/22/2015 URINE, CATHETERIZED   Final  . Special Requests 11/22/2015 NONE   Final  . Culture 11/22/2015    Final                    Value:NO GROWTH Performed at [facility]   . Report Status 11/22/2015 11/22/2015 FINAL   Final  . Glucose-Capillary 11/21/2015 151* 65 - 99 mg/dL Final  . WBC 11/22/2015 11.5* 4.0 - 10.5 K/uL Final  . RBC 11/22/2015 4.02* 4.22 - 5.81 MIL/uL Final  . Hemoglobin 11/22/2015 12.4* 13.0 - 17.0 g/dL Final  . HCT 11/22/2015 37.5* 39.0 - 52.0 % Final  . MCV 11/22/2015 93.3  78.0 - 100.0 fL Final  . MCH 11/22/2015 30.8  26.0 - 34.0 pg Final  . MCHC 11/22/2015 33.1  30.0 - 36.0 g/dL Final  . RDW 11/22/2015 14.4  11.5 - 15.5 % Final  . Platelets 11/22/2015 310  150 - 400 K/uL Final  . Sodium 11/22/2015 133* 135 - 145 mmol/L Final  . Potassium 11/22/2015 4.3  3.5 - 5.1 mmol/L Final  . Chloride 11/22/2015 103  101 - 111 mmol/L Final  . CO2 11/22/2015 24  22 - 32 mmol/L Final  . Glucose, Bld 11/22/2015 127* 65 - 99 mg/dL Final  . BUN 11/22/2015 20  6 - 20 mg/dL Final  . Creatinine, Ser 11/22/2015 1.53* 0.61 - 1.24 mg/dL Final  . Calcium 11/22/2015 8.5* 8.9 - 10.3 mg/dL Final  . GFR calc non Af Amer 11/22/2015 42* >60 mL/min Final  . GFR calc Af Amer 11/22/2015 49* >60 mL/min Final   Comment: (NOTE) The eGFR has been calculated using the CKD EPI equation. This calculation has not been validated in all clinical situations. eGFR's persistently <60 mL/min signify possible Chronic Kidney Disease.   . Anion gap 11/22/2015 6  5 - 15 Final  . WBC 11/23/2015 12.9* 4.0 - 10.5 K/uL Final  . RBC 11/23/2015 4.01* 4.22 - 5.81 MIL/uL Final  . Hemoglobin 11/23/2015 12.4* 13.0 - 17.0 g/dL Final  . HCT 11/23/2015 37.3* 39.0 - 52.0 % Final  . MCV 11/23/2015 93.0  78.0 - 100.0 fL Final  . MCH 11/23/2015 30.9  26.0 - 34.0 pg Final  . MCHC 11/23/2015 33.2  30.0 - 36.0 g/dL Final  . RDW 11/23/2015 14.4  11.5 - 15.5 % Final  . Platelets 11/23/2015 325  150 - 400 K/uL Final  . Sodium 11/23/2015 136  135 - 145 mmol/L Final  . Potassium 11/23/2015 4.4  3.5 - 5.1 mmol/L Final  . Chloride 11/23/2015  104  101 - 111 mmol/L Final  . CO2 11/23/2015 25  22 - 32 mmol/L Final  . Glucose, Bld 11/23/2015 135* 65 - 99 mg/dL Final  . BUN 11/23/2015 18  6 - 20 mg/dL Final  . Creatinine, Ser 11/23/2015 1.47* 0.61 - 1.24 mg/dL Final  . Calcium 11/23/2015 8.6* 8.9 - 10.3 mg/dL Final  . GFR calc non Af Amer 11/23/2015  44* >60 mL/min Final  . GFR calc Af Amer 11/23/2015 51* >60 mL/min Final   Comment: (NOTE) The eGFR has been calculated using the CKD EPI equation. This calculation has not been validated in all clinical situations. eGFR's persistently <60 mL/min signify possible Chronic Kidney Disease.   Georgiann Hahn gap 11/23/2015 7  5 - 15 Final  Hospital Outpatient Visit on 11/12/2015  Component Date Value Ref Range Status  . aPTT 11/12/2015 31  24 - 36 seconds Final  . WBC 11/12/2015 16.4* 4.0 - 10.5 K/uL Final  . RBC 11/12/2015 4.51  4.22 - 5.81 MIL/uL Final  . Hemoglobin 11/12/2015 14.2  13.0 - 17.0 g/dL Final  . HCT 11/12/2015 41.6  39.0 - 52.0 % Final  . MCV 11/12/2015 92.2  78.0 - 100.0 fL Final  . MCH 11/12/2015 31.5  26.0 - 34.0 pg Final  . MCHC 11/12/2015 34.1  30.0 - 36.0 g/dL Final  . RDW 11/12/2015 14.4  11.5 - 15.5 % Final  . Platelets 11/12/2015 158  150 - 400 K/uL Final  . Neutrophils Relative % 11/12/2015 78  % Final  . Neutro Abs 11/12/2015 13.0* 1.7 - 7.7 K/uL Final  . Lymphocytes Relative 11/12/2015 10  % Final  . Lymphs Abs 11/12/2015 1.6  0.7 - 4.0 K/uL Final  . Monocytes Relative 11/12/2015 12  % Final  . Monocytes Absolute 11/12/2015 1.9* 0.1 - 1.0 K/uL Final  . Eosinophils Relative 11/12/2015 0  % Final  . Eosinophils Absolute 11/12/2015 0.0  0.0 - 0.7 K/uL Final  . Basophils Relative 11/12/2015 0  % Final  . Basophils Absolute 11/12/2015 0.0  0.0 - 0.1 K/uL Final  . Sodium 11/12/2015 137  135 - 145 mmol/L Final  . Potassium 11/12/2015 4.1  3.5 - 5.1 mmol/L Final  . Chloride 11/12/2015 103  101 - 111 mmol/L Final  . CO2 11/12/2015 25  22 - 32 mmol/L Final  . Glucose,  Bld 11/12/2015 121* 65 - 99 mg/dL Final  . BUN 11/12/2015 18  6 - 20 mg/dL Final  . Creatinine, Ser 11/12/2015 1.58* 0.61 - 1.24 mg/dL Final  . Calcium 11/12/2015 9.2  8.9 - 10.3 mg/dL Final  . Total Protein 11/12/2015 7.6  6.5 - 8.1 g/dL Final  . Albumin 11/12/2015 4.1  3.5 - 5.0 g/dL Final  . AST 11/12/2015 23  15 - 41 U/L Final  . ALT 11/12/2015 21  17 - 63 U/L Final  . Alkaline Phosphatase 11/12/2015 45  38 - 126 U/L Final  . Total Bilirubin 11/12/2015 0.7  0.3 - 1.2 mg/dL Final  . GFR calc non Af Amer 11/12/2015 41* >60 mL/min Final  . GFR calc Af Amer 11/12/2015 47* >60 mL/min Final   Comment: (NOTE) The eGFR has been calculated using the CKD EPI equation. This calculation has not been validated in all clinical situations. eGFR's persistently <60 mL/min signify possible Chronic Kidney Disease.   . Anion gap 11/12/2015 9  5 - 15 Final  . Prothrombin Time 11/12/2015 14.1  11.4 - 15.2 seconds Final  . INR 11/12/2015 1.08   Final  . ABO/RH(D) 11/21/2015 A POS   Final  . Antibody Screen 11/21/2015 NEG   Final  . Sample Expiration 11/21/2015 11/24/2015   Final  . Extend sample reason 11/21/2015 NO TRANSFUSIONS OR PREGNANCY IN THE PAST 3 MONTHS   Final  . Color, Urine 11/12/2015 YELLOW  YELLOW Final  . APPearance 11/12/2015 TURBID* CLEAR Final  . Specific Gravity, Urine 11/12/2015 1.014  1.005 - 1.030 Final  . pH 11/12/2015 5.5  5.0 - 8.0 Final  . Glucose, UA 11/12/2015 NEGATIVE  NEGATIVE mg/dL Final  . Hgb urine dipstick 11/12/2015 MODERATE* NEGATIVE Final  . Bilirubin Urine 11/12/2015 NEGATIVE  NEGATIVE Final  . Ketones, ur 11/12/2015 NEGATIVE  NEGATIVE mg/dL Final  . Protein, ur 11/12/2015 30* NEGATIVE mg/dL Final  . Nitrite 11/12/2015 NEGATIVE  NEGATIVE Final  . Leukocytes, UA 11/12/2015 LARGE* NEGATIVE Final  . MRSA, PCR 11/12/2015 NEGATIVE  NEGATIVE Final  . Staphylococcus aureus 11/12/2015 NEGATIVE  NEGATIVE Final   Comment:        The Xpert SA Assay (FDA approved for  NASAL specimens in patients over 90 years of age), is one component of a comprehensive surveillance program.  Test performance has been validated by [facility] for patients greater than or equal to 39 year old. It is not intended to diagnose infection nor to guide or monitor treatment.   . Squamous Epithelial / LPF 11/12/2015 6-30* NONE SEEN Final  . WBC, UA 11/12/2015 TOO NUMEROUS TO COUNT  0 - 5 WBC/hpf Final  . RBC / HPF 11/12/2015 0-5  0 - 5 RBC/hpf Final  . Bacteria, UA 11/12/2015 MANY* NONE SEEN Final  . ABO/RH(D) 11/12/2015 A POS   Final  Office Visit on 10/31/2015  Component Date Value Ref Range Status  . Hgb A1c MFr Bld 10/31/2015 6.8* 4.6 - 6.5 % Final  . Sodium 10/31/2015 138  135 - 145 mEq/L Final  . Potassium 10/31/2015 5.0  3.5 - 5.1 mEq/L Final  . Chloride 10/31/2015 103  96 - 112 mEq/L Final  . CO2 10/31/2015 29  19 - 32 mEq/L Final  . Glucose, Bld 10/31/2015 123* 70 - 99 mg/dL Final  . BUN 10/31/2015 22  6 - 23 mg/dL Final  . Creatinine, Ser 10/31/2015 1.47  0.40 - 1.50 mg/dL Final  . Calcium 10/31/2015 9.4  8.4 - 10.5 mg/dL Final  . GFR 10/31/2015 49.27* >60.00 mL/min Final     X-Rays:Dg Chest 2 View  Result Date: 11/12/2015 CLINICAL DATA:  Preoperative examination prior to left total knee arthroplasty. History of previous MI, CHF, former smoker discontinued in 1972. EXAM: CHEST  2 VIEW COMPARISON:  Chest x-ray of August 12, 2006. FINDINGS: The lungs are well-expanded. The interstitial markings are coarse though markedly improved over the previous study. The heart and pulmonary vascularity are normal. The ICD is in stable position. The sternal wires are intact. IMPRESSION: There is no active cardiopulmonary disease. Probable underlying emphysematous changes Electronically Signed   By: David  Martinique M.D.   On: 11/12/2015 14:49    EKG: Orders placed or performed in visit on 11/07/15  . EKG 12-Lead     Hospital Course: Journee Kohen is a 80 y.o. who was  admitted to [facility]. They were brought to the operating room on 11/21/2015 and underwent Procedure(s): LEFT TOTAL KNEE ARTHROPLASTY.  Patient tolerated the procedure well and was later transferred to the recovery room and then to the orthopaedic floor for postoperative care.  They were given PO and IV analgesics for pain control following their surgery.  They were given 24 hours of postoperative antibiotics of  Anti-infectives    Start     Dose/Rate Route Frequency Ordered Stop   11/21/15 1800  ceFAZolin (ANCEF) IVPB 1 g/50 mL premix     1 g 100 mL/hr over 30 Minutes Intravenous Every 6 hours 11/21/15 1517 11/22/15 0129   11/21/15 1148  polymyxin B  500,000 Units, bacitracin 50,000 Units in sodium chloride irrigation 0.9 % 500 mL irrigation  Status:  Discontinued       As needed 11/21/15 1148 11/21/15 1314   11/21/15 0900  ceFAZolin (ANCEF) IVPB 2g/100 mL premix     2 g 200 mL/hr over 30 Minutes Intravenous On call to O.R. 11/21/15 0900 11/21/15 1109     and started on DVT prophylaxis in the form of Xarelto.   PT and OT were ordered for total joint protocol.  Discharge planning consulted to help with postop disposition and equipment needs.  Patient had a good night on the evening of surgery.  They started to get up OOB with therapy on day one. Hemovac drain was pulled without difficulty.  He had some urinary issues including a UTI preop for which he was treated with Bactrim. Urine culture was taken at time of foley placement, which yielding negative growth. Continued to work with therapy into day two.  Dressing was changed on day two and the incision was clean and dry. The patient had progressed with therapy and meeting their goals.  Incision was healing well.  Patient was seen in rounds and was ready to go home. Patient was transitioned to aspirin for DVT prophylaxis upon discharge.   Diet: Cardiac diet Activity:WBAT Follow-up:in 2 weeks Disposition - Home Discharged Condition:  stable   Discharge Instructions    Call MD / Call 911    Complete by:  As directed    If you experience chest pain or shortness of breath, CALL 911 and be transported to the hospital emergency room.  If you develope a fever above 101 F, pus (white drainage) or increased drainage or redness at the wound, or calf pain, call your surgeon's office.   Constipation Prevention    Complete by:  As directed    Drink plenty of fluids.  Prune juice may be helpful.  You may use a stool softener, such as Colace (over the counter) 100 mg twice a day.  Use MiraLax (over the counter) for constipation as needed.   Diet - low sodium heart healthy    Complete by:  As directed    Discharge instructions    Complete by:  As directed    INSTRUCTIONS AFTER JOINT REPLACEMENT   Remove items at home which could result in a fall. This includes throw rugs or furniture in walking pathways ICE to the affected joint every three hours while awake for 30 minutes at a time, for at least the first 3-5 days, and then as needed for pain and swelling.  Continue to use ice for pain and swelling. You may notice swelling that will progress down to the foot and ankle.  This is normal after surgery.  Elevate your leg when you are not up walking on it.   Continue to use the breathing machine you got in the hospital (incentive spirometer) which will help keep your temperature down.  It is common for your temperature to cycle up and down following surgery, especially at night when you are not up moving around and exerting yourself.  The breathing machine keeps your lungs expanded and your temperature down.   DIET:  As you were doing prior to hospitalization, we recommend a well-balanced diet.  DRESSING / WOUND CARE / SHOWERING  You may change your dressing every day with sterile gauze.  Please use good hand washing techniques before changing the dressing.  Do not use any lotions or creams on the incision until  instructed by your  surgeon.  ACTIVITY  Increase activity slowly as tolerated, but follow the weight bearing instructions below.   No driving for 6 weeks or until further direction given by your physician.  You cannot drive while taking narcotics.  No lifting or carrying greater than 10 lbs. until further directed by your surgeon. Avoid periods of inactivity such as sitting longer than an hour when not asleep. This helps prevent blood clots.  You may return to work once you are authorized by your doctor.     WEIGHT BEARING   Weight bearing as tolerated with assist device (walker, cane, etc) as directed, use it as long as suggested by your surgeon or therapist, typically at least 4-6 weeks.   EXERCISES  Results after joint replacement surgery are often greatly improved when you follow the exercise, range of motion and muscle strengthening exercises prescribed by your doctor. Safety measures are also important to protect the joint from further injury. Any time any of these exercises cause you to have increased pain or swelling, decrease what you are doing until you are comfortable again and then slowly increase them. If you have problems or questions, call your caregiver or physical therapist for advice.   Rehabilitation is important following a joint replacement. After just a few days of immobilization, the muscles of the leg can become weakened and shrink (atrophy).  These exercises are designed to build up the tone and strength of the thigh and leg muscles and to improve motion. Often times heat used for twenty to thirty minutes before working out will loosen up your tissues and help with improving the range of motion but do not use heat for the first two weeks following surgery (sometimes heat can increase post-operative swelling).   These exercises can be done on a training (exercise) mat, on the floor, on a table or on a bed. Use whatever works the best and is most comfortable for you.    Use music or  television while you are exercising so that the exercises are a pleasant break in your day. This will make your life better with the exercises acting as a break in your routine that you can look forward to.   Perform all exercises about fifteen times, three times per day or as directed.  You should exercise both the operative leg and the other leg as well.  Exercises include:   Quad Sets - Tighten up the muscle on the front of the thigh (Quad) and hold for 5-10 seconds.   Straight Leg Raises - With your knee straight (if you were given a brace, keep it on), lift the leg to 60 degrees, hold for 3 seconds, and slowly lower the leg.  Perform this exercise against resistance later as your leg gets stronger.  Leg Slides: Lying on your back, slowly slide your foot toward your buttocks, bending your knee up off the floor (only go as far as is comfortable). Then slowly slide your foot back down until your leg is flat on the floor again.  Angel Wings: Lying on your back spread your legs to the side as far apart as you can without causing discomfort.  Hamstring Strength:  Lying on your back, push your heel against the floor with your leg straight by tightening up the muscles of your buttocks.  Repeat, but this time bend your knee to a comfortable angle, and push your heel against the floor.  You may put a pillow under the heel to make  it more comfortable if necessary.   A rehabilitation program following joint replacement surgery can speed recovery and prevent re-injury in the future due to weakened muscles. Contact your doctor or a physical therapist for more information on knee rehabilitation.    CONSTIPATION  Constipation is defined medically as fewer than three stools per week and severe constipation as less than one stool per week.  Even if you have a regular bowel pattern at home, your normal regimen is likely to be disrupted due to multiple reasons following surgery.  Combination of anesthesia,  postoperative narcotics, change in appetite and fluid intake all can affect your bowels.   YOU MUST use at least one of the following options; they are listed in order of increasing strength to get the job done.  They are all available over the counter, and you may need to use some, POSSIBLY even all of these options:    Drink plenty of fluids (prune juice may be helpful) and high fiber foods Colace 100 mg by mouth twice a day  Senokot for constipation as directed and as needed Dulcolax (bisacodyl), take with full glass of water  Miralax (polyethylene glycol) once or twice a day as needed.  If you have tried all these things and are unable to have a bowel movement in the first 3-4 days after surgery call either your surgeon or your primary doctor.    If you experience loose stools or diarrhea, hold the medications until you stool forms back up.  If your symptoms do not get better within 1 week or if they get worse, check with your doctor.  If you experience "the worst abdominal pain ever" or develop nausea or vomiting, please contact the office immediately for further recommendations for treatment.   ITCHING:  If you experience itching with your medications, try taking only a single pain pill, or even half a pain pill at a time.  You can also use Benadryl over the counter for itching or also to help with sleep.   TED HOSE STOCKINGS:  Use stockings on both legs until for at least 2 weeks or as directed by physician office. They may be removed at night for sleeping.  MEDICATIONS:  See your medication summary on the "After Visit Summary" that nursing will review with you.  You may have some home medications which will be placed on hold until you complete the course of blood thinner medication.  It is important for you to complete the blood thinner medication as prescribed.  PRECAUTIONS:  If you experience chest pain or shortness of breath - call 911 immediately for transfer to the hospital emergency  department.   If you develop a fever greater that 101 F, purulent drainage from wound, increased redness or drainage from wound, foul odor from the wound/dressing, or calf pain - CONTACT YOUR SURGEON.                                                   FOLLOW-UP APPOINTMENTS:  If you do not already have a post-op appointment, please call the office for an appointment to be seen by your surgeon.  Guidelines for how soon to be seen are listed in your "After Visit Summary", but are typically between 1-4 weeks after surgery.   MAKE SURE YOU:  Understand these instructions.  Get help right away if  you are not doing well or get worse.    Thank you for letting us be a part of your medical care team.  It is a privilege we respect greatly.  We hope these instructions will help you stay on track for a fast and full recovery!   Increase activity slowly as tolerated    Complete by:  As directed        Medication List    STOP taking these medications   acetaminophen 650 MG CR tablet Commonly known as:  TYLENOL     TAKE these medications   aspirin 325 MG EC tablet Take 1 tablet (325 mg total) by mouth 2 (two) times daily. To prevent blood clots What changed:  when to take this  additional instructions   atorvastatin 20 MG tablet Commonly known as:  LIPITOR Take 1 tablet (20 mg total) by mouth daily.   b complex vitamins tablet Take 1 tablet by mouth daily.   BIOFREEZE EX Apply 1 application topically daily as needed (pain).   CALCIUM PO Take 1 tablet by mouth daily.   GLUCOSAMINE CHONDROITIN JOINT Tabs Take 1 tablet by mouth daily.   KRILL OIL OMEGA-3 PO Take 1 capsule by mouth daily.   lisinopril 5 MG tablet Commonly known as:  PRINIVIL,ZESTRIL Take 1 tablet (5 mg total) by mouth daily.   methocarbamol 500 MG tablet Commonly known as:  ROBAXIN Take 1 tablet (500 mg total) by mouth every 6 (six) hours as needed for muscle spasms.   metoprolol tartrate 25 MG  tablet Commonly known as:  LOPRESSOR Take 1 tablet (25 mg total) by mouth 2 (two) times daily.   multivitamin tablet Take 1 tablet by mouth daily.   NON FORMULARY Take 1 tablet by mouth daily. OPC 3.  antioxidant protection   oxyCODONE-acetaminophen 5-325 MG tablet Commonly known as:  PERCOCET/ROXICET Take 1-2 tablets by mouth every 4 (four) hours as needed for moderate pain. What changed:  how much to take  reasons to take this   pantoprazole 40 MG tablet Commonly known as:  PROTONIX Take 1 tablet (40 mg total) by mouth daily.   sildenafil 20 MG tablet Commonly known as:  REVATIO Take 20 mg by mouth as needed.   sulfamethoxazole-trimethoprim 800-160 MG tablet Commonly known as:  BACTRIM DS,SEPTRA DS Take 1 tablet by mouth 2 (two) times daily. For 7 days   testosterone cypionate 100 MG/ML injection Commonly known as:  DEPOTESTOTERONE CYPIONATE Inject into the muscle every 28 (twenty-eight) days. For IM use only      [place] .   Why:  rolling walker Contact information: [place] [address] [phone]        Tobi Bastos, MD. Schedule an appointment as soon as possible for a visit in 2 week(s).   Specialty:  Orthopedic Surgery Contact information: [address] [place] [address] [phone]           Signed: Ardeen Jourdain, PA-C Orthopaedic Surgery 11/26/2015, 9:12 AM

## 2015-11-28 DIAGNOSIS — M25662 Stiffness of left knee, not elsewhere classified: Secondary | ICD-10-CM | POA: Diagnosis not present

## 2015-11-30 DIAGNOSIS — M25662 Stiffness of left knee, not elsewhere classified: Secondary | ICD-10-CM | POA: Diagnosis not present

## 2015-12-03 ENCOUNTER — Encounter: Payer: Medicare Other | Admitting: Family Medicine

## 2015-12-03 DIAGNOSIS — M25662 Stiffness of left knee, not elsewhere classified: Secondary | ICD-10-CM | POA: Diagnosis not present

## 2015-12-03 DIAGNOSIS — Z471 Aftercare following joint replacement surgery: Secondary | ICD-10-CM | POA: Diagnosis not present

## 2015-12-03 DIAGNOSIS — M1712 Unilateral primary osteoarthritis, left knee: Secondary | ICD-10-CM | POA: Diagnosis not present

## 2015-12-03 DIAGNOSIS — Z96652 Presence of left artificial knee joint: Secondary | ICD-10-CM | POA: Diagnosis not present

## 2015-12-05 DIAGNOSIS — M25662 Stiffness of left knee, not elsewhere classified: Secondary | ICD-10-CM | POA: Diagnosis not present

## 2015-12-06 DIAGNOSIS — E291 Testicular hypofunction: Secondary | ICD-10-CM | POA: Diagnosis not present

## 2015-12-07 DIAGNOSIS — M25662 Stiffness of left knee, not elsewhere classified: Secondary | ICD-10-CM | POA: Diagnosis not present

## 2015-12-10 DIAGNOSIS — M25662 Stiffness of left knee, not elsewhere classified: Secondary | ICD-10-CM | POA: Diagnosis not present

## 2015-12-12 DIAGNOSIS — M25662 Stiffness of left knee, not elsewhere classified: Secondary | ICD-10-CM | POA: Diagnosis not present

## 2015-12-14 DIAGNOSIS — M25662 Stiffness of left knee, not elsewhere classified: Secondary | ICD-10-CM | POA: Diagnosis not present

## 2015-12-18 DIAGNOSIS — M25662 Stiffness of left knee, not elsewhere classified: Secondary | ICD-10-CM | POA: Diagnosis not present

## 2015-12-20 DIAGNOSIS — M1712 Unilateral primary osteoarthritis, left knee: Secondary | ICD-10-CM | POA: Diagnosis not present

## 2015-12-20 DIAGNOSIS — Z471 Aftercare following joint replacement surgery: Secondary | ICD-10-CM | POA: Diagnosis not present

## 2015-12-20 DIAGNOSIS — Z96652 Presence of left artificial knee joint: Secondary | ICD-10-CM | POA: Diagnosis not present

## 2015-12-21 DIAGNOSIS — M25662 Stiffness of left knee, not elsewhere classified: Secondary | ICD-10-CM | POA: Diagnosis not present

## 2015-12-24 DIAGNOSIS — M25662 Stiffness of left knee, not elsewhere classified: Secondary | ICD-10-CM | POA: Diagnosis not present

## 2015-12-26 DIAGNOSIS — R351 Nocturia: Secondary | ICD-10-CM | POA: Diagnosis not present

## 2015-12-26 DIAGNOSIS — N401 Enlarged prostate with lower urinary tract symptoms: Secondary | ICD-10-CM | POA: Diagnosis not present

## 2015-12-26 DIAGNOSIS — E291 Testicular hypofunction: Secondary | ICD-10-CM | POA: Diagnosis not present

## 2015-12-26 DIAGNOSIS — N5201 Erectile dysfunction due to arterial insufficiency: Secondary | ICD-10-CM | POA: Diagnosis not present

## 2015-12-27 DIAGNOSIS — M25662 Stiffness of left knee, not elsewhere classified: Secondary | ICD-10-CM | POA: Diagnosis not present

## 2015-12-28 ENCOUNTER — Encounter: Payer: Self-pay | Admitting: Family Medicine

## 2015-12-28 ENCOUNTER — Ambulatory Visit (INDEPENDENT_AMBULATORY_CARE_PROVIDER_SITE_OTHER): Payer: Medicare Other | Admitting: Family Medicine

## 2015-12-28 VITALS — BP 124/75 | HR 86 | Temp 97.8°F | Resp 16 | Ht 71.0 in | Wt 224.8 lb

## 2015-12-28 DIAGNOSIS — N183 Chronic kidney disease, stage 3 unspecified: Secondary | ICD-10-CM

## 2015-12-28 DIAGNOSIS — M1712 Unilateral primary osteoarthritis, left knee: Secondary | ICD-10-CM

## 2015-12-28 DIAGNOSIS — I10 Essential (primary) hypertension: Secondary | ICD-10-CM

## 2015-12-28 DIAGNOSIS — I2589 Other forms of chronic ischemic heart disease: Secondary | ICD-10-CM | POA: Diagnosis not present

## 2015-12-28 DIAGNOSIS — E7800 Pure hypercholesterolemia, unspecified: Secondary | ICD-10-CM

## 2015-12-28 DIAGNOSIS — E119 Type 2 diabetes mellitus without complications: Secondary | ICD-10-CM

## 2015-12-28 NOTE — Progress Notes (Signed)
Office Note 12/28/2015  CC:  Chief Complaint  Patient presents with  . Annual Exam    CPE   HPI:  [name] is a 80 y.o. White male who is here for follow up DM 2, HLD, HTN, CRI stage III. He feels great with his left TKA, still doing rehab 3 times per week.  Takes a 10/325 oxycodone hs for pain. He is happy with his results so far.  He had a UTI prior to surgery, took 7d bactrim and this took care of sx's.    No home bp or glucose monitoring. Compliant with all meds and denies any side effects.    Past Medical History:  Diagnosis Date  . AICD (automatic cardioverter/defibrillator) present   . Anemia, unspecified   . Arthritis   . BPH with obstruction/lower urinary tract symptoms   . CAD (coronary artery disease)    a. MI/CABG 1996. b. VF arrest 2008: cath with severe native 3v disease, 3/3 grafts widely patent, mild to moderate left ventricular systolic dysfunction secondary to anteroapical scar. EF 40-45%.  . Cardiac arrest [facility]) 2008   2008: aborted cardiac arrest 2008 (c/b encephalopathy, VDRF, aspiriation PNA, CHF, elevated LFTs), s/p St. Jude ICD placement.  . CHF (congestive heart failure) ([place])   . Chronic venous insufficiency   . CKD (chronic kidney disease), stage III    difficulty urinating seeing Dr Gaynelle Arabian on 11/13/2015 at 0800am  . Diabetes mellitus without complication ([place])   . Disorder of bone and cartilage, unspecified   . Erectile dysfunction       "           "      "              "                  "    . Essential hypertension   . GERD (gastroesophageal reflux disease)   . Hx of adenomatous colonic polyps 2003, 2013  . Hypercholesterolemia   . Hypogonadism male    managed by [facility] (Dr. Gaynelle Arabian)  . Ischemic cardiomyopathy    a. EF 40-45% by cath 2008.  EF 39% by nuclear study 2016  . Meniscal injury Jan/Feb 2016   Sustained shoveling snow; repaired by Dr. Gladstone Lighter   . Myocardial infarction   . Obesity   . OSA on CPAP     sleep study 2008, severe OSA: CPAP 11 cm H2O  . Urinary tract infection    diagnosed on 11/13/2015     Past Surgical History:  Procedure Laterality Date  . CARDIAC DEFIBRILLATOR PLACEMENT    . CARDIOVASCULAR STRESS TEST  04/2014   High risk study: large scar, EF 39%, +WM abnl  . COLONOSCOPY  06/10/11   One diminutive polyp removed (tubular adenoma, no high grade dysplasia).  Repeat colonoscopy 5 yrs (Dr. Ardis Hughs).  . COLONOSCOPY W/ POLYPECTOMY  2003   Adenomatous; repeat 2006 was recommended but this was not done until 06/10/2011 (by different GI MD)  . CORONARY ARTERY BYPASS GRAFT  1996   3 vessel  . EP IMPLANTABLE DEVICE N/A 04/27/2015   Procedure: ICD Generator Changeout;  Surgeon: Deboraha Sprang, MD;  Location: [place] CV LAB;  Service: Cardiovascular;  Laterality: N/A;  . KNEE CARTILAGE SURGERY  04/2014   Dr. Gladstone Lighter: torn meniscus repair  . PACEMAKER PLACEMENT  07/2006   St Jude current VRRF--generator change 03/2015  . POLYPECTOMY    . right carpal tunnel  release     . TOTAL KNEE ARTHROPLASTY Left 11/21/2015   Procedure: LEFT TOTAL KNEE ARTHROPLASTY;  Surgeon: Latanya Maudlin, MD;  Location: [facility];  Service: Orthopedics;  Laterality: Left;    Family History  Problem Relation Age of Onset  . Heart disease Mother   . Stroke Mother   . Heart disease Father   . Colon cancer Neg Hx   . Esophageal cancer Neg Hx   . Stomach cancer Neg Hx   . Rectal cancer Neg Hx     Social History   Social History  . Marital status: Married    Spouse name: N/A  . Number of children: N/A  . Years of education: N/A   Occupational History  . Not on file.   Social History Main Topics  . Smoking status: Former Smoker    Types: Cigarettes    Quit date: 02/17/1970  . Smokeless tobacco: Never Used  . Alcohol use 4.2 oz/week    7 Glasses of wine per week  . Drug use: No  . Sexual activity: Not on file   Other Topics Concern  . Not on file   Social History Narrative   Married, 2  daughters, 4 grandchildren.   Retired Producer, television/film/video for [employer] [phone]).   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   Not sedentary, but has no formal exercise regimen.   Ex-smoker, quit in his 72s.  Drinks one glass of wine daily.   No drug use.    Outpatient Medications Prior to Visit  Medication Sig Dispense Refill  . aspirin 325 MG EC tablet Take 1 tablet (325 mg total) by mouth 2 (two) times daily. To prevent blood clots 30 tablet 0  . atorvastatin (LIPITOR) 20 MG tablet Take 1 tablet (20 mg total) by mouth daily. 90 tablet 3  . b complex vitamins tablet Take 1 tablet by mouth daily.    [name] CALCIUM PO Take 1 tablet by mouth daily.    . Glucos-Chondroit-Hyaluron-MSM (GLUCOSAMINE CHONDROITIN JOINT) TABS Take 1 tablet by mouth daily.    [name] KRILL OIL OMEGA-3 PO Take 1 capsule by mouth daily.    [name] lisinopril (PRINIVIL,ZESTRIL) 5 MG tablet Take 1 tablet (5 mg total) by mouth daily. 90 tablet 3  . Menthol, Topical Analgesic, (BIOFREEZE EX) Apply 1 application topically daily as needed (pain).    . methocarbamol (ROBAXIN) 500 MG tablet Take 1 tablet (500 mg total) by mouth every 6 (six) hours as needed for muscle spasms. 40 tablet 1  . metoprolol tartrate (LOPRESSOR) 25 MG tablet Take 1 tablet (25 mg total) by mouth 2 (two) times daily. 180 tablet 3  . Multiple Vitamin (MULTIVITAMIN) tablet Take 1 tablet by mouth daily.    . NON FORMULARY Take 1 tablet by mouth daily. OPC 3.  antioxidant protection    . oxyCODONE-acetaminophen (PERCOCET/ROXICET) 5-325 MG tablet Take 1-2 tablets by mouth every 4 (four) hours as needed for moderate pain. 60 tablet 0  . pantoprazole (PROTONIX) 40 MG tablet Take 1 tablet (40 mg total) by mouth daily. 90 tablet 3  . sildenafil (REVATIO) 20 MG tablet Take 20 mg by mouth as needed.    . testosterone cypionate (DEPOTESTOTERONE CYPIONATE) 100 MG/ML injection Inject into the muscle every 28 (twenty-eight) days. For IM use only    . sulfamethoxazole-trimethoprim (BACTRIM  DS,SEPTRA DS) 800-160 MG tablet Take 1 tablet by mouth 2 (two) times daily. For 7 days     No facility-administered medications prior to visit.  Allergies  Allergen Reactions  . Naproxen Sodium Nausea Only    ROS Review of Systems  Constitutional: Negative for appetite change, chills, fatigue and fever.  HENT: Negative for congestion, dental problem, ear pain and sore throat.   Eyes: Negative for discharge, redness and visual disturbance.  Respiratory: Negative for cough, chest tightness, shortness of breath and wheezing.   Cardiovascular: Negative for chest pain, palpitations and leg swelling.  Gastrointestinal: Negative for abdominal pain, blood in stool, diarrhea, nausea and vomiting.  Genitourinary: Negative for difficulty urinating, dysuria, flank pain, frequency, hematuria and urgency.  Musculoskeletal: Positive for arthralgias (left knee pain/stiffness). Negative for back pain, joint swelling, myalgias and neck stiffness.  Skin: Negative for pallor and rash.  Neurological: Negative for dizziness, speech difficulty, weakness and headaches.  Hematological: Negative for adenopathy. Does not bruise/bleed easily.  Psychiatric/Behavioral: Negative for confusion and sleep disturbance. The patient is not nervous/anxious.     PE; Blood pressure 124/75, pulse 86, temperature 97.8 F (36.6 C), temperature source Temporal, resp. rate 16, height 5\' 11"  (1.803 m), weight 224 lb 12.8 oz (102 kg), SpO2 96 %. Gen: Alert, well appearing.  Patient is oriented to person, place, time, and situation. AFFECT: pleasant, lucid thought and speech. ENT: Ears: EACs clear, normal epithelium.  TMs with good light reflex and landmarks bilaterally.  Eyes: no injection, icteris, swelling, or exudate.  EOMI, PERRLA. Nose: no drainage or turbinate edema/swelling.  No injection or focal lesion.  Mouth: lips without lesion/swelling.  Oral mucosa pink and moist.  Dentition intact and without obvious caries or  gingival swelling.  Oropharynx without erythema, exudate, or swelling.  Neck: supple/nontender.  No LAD, mass, or TM.  Carotid pulses 2+ bilaterally, without bruits. CV: RRR, no m/r/g.   LUNGS: CTA bilat, nonlabored resps, good aeration in all lung fields. ABD: soft, NT, ND, BS normal.  No hepatospenomegaly or mass.  No bruits. EXT: no clubbing or cyanosis.  He has trace to 1+ edema on L LL.  Chronic venous stasis skin changes present bilat LL. Musculoskeletal: no joint swelling, erythema, warmth, or tenderness.  ROM of all joints intact. Skin - no sores or suspicious lesions or rashes or color changes Rectal: deferred b/c pt gets this done annually by his urologist.  Pertinent labs:  Lab Results  Component Value Date   TSH 4.20 03/19/2010   Lab Results  Component Value Date   WBC 12.9 (H) 11/23/2015   HGB 12.4 (L) 11/23/2015   HCT 37.3 (L) 11/23/2015   MCV 93.0 11/23/2015   PLT 325 11/23/2015   Lab Results  Component Value Date   CREATININE 1.47 (H) 11/23/2015   BUN 18 11/23/2015   NA 136 11/23/2015   K 4.4 11/23/2015   CL 104 11/23/2015   CO2 25 11/23/2015   Lab Results  Component Value Date   ALT 21 11/12/2015   AST 23 11/12/2015   ALKPHOS 45 11/12/2015   BILITOT 0.7 11/12/2015   Lab Results  Component Value Date   CHOL 127 03/30/2015   Lab Results  Component Value Date   HDL 42.40 03/30/2015   Lab Results  Component Value Date   LDLCALC 68 03/30/2015   Lab Results  Component Value Date   TRIG 79.0 03/30/2015   Lab Results  Component Value Date   CHOLHDL 3 03/30/2015   Lab Results  Component Value Date   PSA 0.34 12/18/2011   PSA 0.43 03/24/2011   PSA 0.34 03/19/2010   Lab Results  Component Value Date  HGBA1C 6.8 (H) 10/31/2015    ASSESSMENT AND PLAN:   1) DM 2, well controlled with diet. Not due for HbA1c or any other monitoring at this time. Recheck A1c at next f/u in 4 mo.  2) HLD: he actually is on statin due to his CAD (i.e  secondary prevention). Tolerating statin.  Last lipids 03/2015 were great.  AST/ALT normal a couple months ago. Plan on recheck FLP at next f/u in 4 mo.  3) HTN: The current medical regimen is effective;  continue present plan and medications. Lytes/cr stable 1 mo ago  4) CRI stage III: lytes/cr stable 1 mo ago when in hosp for his knee surgery.  5) Knee osteoarthritis, now s/p left TKA: continue rehab and ortho f/u.  Doing well.  6) Preventative health care: vaccines UTD.  An After Visit Summary was printed and given to the patient.  FOLLOW UP:  Return in about 4 months (around 04/26/2016) for routine chronic illness f/u--fasting.  Signed:  Crissie Sickles, MD           12/28/2015

## 2015-12-31 DIAGNOSIS — M25662 Stiffness of left knee, not elsewhere classified: Secondary | ICD-10-CM | POA: Diagnosis not present

## 2016-01-02 DIAGNOSIS — M25662 Stiffness of left knee, not elsewhere classified: Secondary | ICD-10-CM | POA: Diagnosis not present

## 2016-01-03 DIAGNOSIS — E291 Testicular hypofunction: Secondary | ICD-10-CM | POA: Diagnosis not present

## 2016-01-07 DIAGNOSIS — Z471 Aftercare following joint replacement surgery: Secondary | ICD-10-CM | POA: Diagnosis not present

## 2016-01-07 DIAGNOSIS — M25662 Stiffness of left knee, not elsewhere classified: Secondary | ICD-10-CM | POA: Diagnosis not present

## 2016-01-07 DIAGNOSIS — Z96652 Presence of left artificial knee joint: Secondary | ICD-10-CM | POA: Diagnosis not present

## 2016-01-07 DIAGNOSIS — M1712 Unilateral primary osteoarthritis, left knee: Secondary | ICD-10-CM | POA: Diagnosis not present

## 2016-01-17 DIAGNOSIS — M25662 Stiffness of left knee, not elsewhere classified: Secondary | ICD-10-CM | POA: Diagnosis not present

## 2016-01-21 DIAGNOSIS — M25662 Stiffness of left knee, not elsewhere classified: Secondary | ICD-10-CM | POA: Diagnosis not present

## 2016-01-28 DIAGNOSIS — M25662 Stiffness of left knee, not elsewhere classified: Secondary | ICD-10-CM | POA: Diagnosis not present

## 2016-01-28 DIAGNOSIS — Z96652 Presence of left artificial knee joint: Secondary | ICD-10-CM | POA: Diagnosis not present

## 2016-01-28 DIAGNOSIS — Z471 Aftercare following joint replacement surgery: Secondary | ICD-10-CM | POA: Diagnosis not present

## 2016-01-31 DIAGNOSIS — M25662 Stiffness of left knee, not elsewhere classified: Secondary | ICD-10-CM | POA: Diagnosis not present

## 2016-02-05 DIAGNOSIS — M25662 Stiffness of left knee, not elsewhere classified: Secondary | ICD-10-CM | POA: Diagnosis not present

## 2016-02-07 DIAGNOSIS — M25662 Stiffness of left knee, not elsewhere classified: Secondary | ICD-10-CM | POA: Diagnosis not present

## 2016-03-24 ENCOUNTER — Encounter: Payer: Self-pay | Admitting: Gastroenterology

## 2016-03-25 DIAGNOSIS — E291 Testicular hypofunction: Secondary | ICD-10-CM | POA: Diagnosis not present

## 2016-03-28 ENCOUNTER — Other Ambulatory Visit: Payer: Self-pay | Admitting: *Deleted

## 2016-03-28 MED ORDER — ATORVASTATIN CALCIUM 20 MG PO TABS: 20.0000 mg | ORAL_TABLET | Freq: Every day | ORAL | 1 refills | Status: DC

## 2016-03-28 MED ORDER — PANTOPRAZOLE SODIUM 40 MG PO TBEC: 40.0000 mg | DELAYED_RELEASE_TABLET | Freq: Every day | ORAL | 3 refills | Status: DC

## 2016-03-28 MED ORDER — LISINOPRIL 5 MG PO TABS: 5.0000 mg | ORAL_TABLET | Freq: Every day | ORAL | 1 refills | Status: DC

## 2016-03-28 NOTE — Telephone Encounter (Signed)
Fax from [facility].  RF request for lisinopril LOV: 12/28/15 Next ov: 04/18/16 Last written: 03/30/15 #90 w/ 3RF  RF request for atorvastatin Last written: 03/30/15 #90 w/ 3RF  RF request for pantoprazole Last written: 03/30/15 #90 w/3RF

## 2016-04-01 DIAGNOSIS — E291 Testicular hypofunction: Secondary | ICD-10-CM | POA: Diagnosis not present

## 2016-04-14 DIAGNOSIS — E291 Testicular hypofunction: Secondary | ICD-10-CM | POA: Diagnosis not present

## 2016-04-18 ENCOUNTER — Encounter: Payer: Self-pay | Admitting: Family Medicine

## 2016-04-18 ENCOUNTER — Ambulatory Visit (INDEPENDENT_AMBULATORY_CARE_PROVIDER_SITE_OTHER): Payer: Medicare Other | Admitting: Family Medicine

## 2016-04-18 VITALS — BP 130/68 | HR 63 | Temp 98.0°F | Resp 16 | Ht 71.0 in | Wt 234.5 lb

## 2016-04-18 DIAGNOSIS — N183 Chronic kidney disease, stage 3 unspecified: Secondary | ICD-10-CM

## 2016-04-18 DIAGNOSIS — E119 Type 2 diabetes mellitus without complications: Secondary | ICD-10-CM

## 2016-04-18 DIAGNOSIS — I10 Essential (primary) hypertension: Secondary | ICD-10-CM | POA: Diagnosis not present

## 2016-04-18 DIAGNOSIS — D649 Anemia, unspecified: Secondary | ICD-10-CM

## 2016-04-18 DIAGNOSIS — E7800 Pure hypercholesterolemia, unspecified: Secondary | ICD-10-CM

## 2016-04-18 LAB — CBC WITH DIFFERENTIAL/PLATELET
Basophils Absolute: 0.1 10*3/uL (ref 0.0–0.1)
Basophils Relative: 1.1 % (ref 0.0–3.0)
Eosinophils Absolute: 0.2 10*3/uL (ref 0.0–0.7)
Eosinophils Relative: 4.8 % (ref 0.0–5.0)
HCT: 42.4 % (ref 39.0–52.0)
Hemoglobin: 14.3 g/dL (ref 13.0–17.0)
Lymphocytes Relative: 31.8 % (ref 12.0–46.0)
Lymphs Abs: 1.6 10*3/uL (ref 0.7–4.0)
MCHC: 33.8 g/dL (ref 30.0–36.0)
MCV: 91.2 fl (ref 78.0–100.0)
Monocytes Absolute: 0.6 10*3/uL (ref 0.1–1.0)
Monocytes Relative: 11.5 % (ref 3.0–12.0)
Neutro Abs: 2.6 10*3/uL (ref 1.4–7.7)
Neutrophils Relative %: 50.8 % (ref 43.0–77.0)
Platelets: 207 10*3/uL (ref 150.0–400.0)
RBC: 4.65 Mil/uL (ref 4.22–5.81)
RDW: 15.2 % (ref 11.5–15.5)
WBC: 5.2 10*3/uL (ref 4.0–10.5)

## 2016-04-18 LAB — BASIC METABOLIC PANEL
BUN: 23 mg/dL (ref 6–23)
CO2: 26 mEq/L (ref 19–32)
Calcium: 9.5 mg/dL (ref 8.4–10.5)
Chloride: 104 mEq/L (ref 96–112)
Creatinine, Ser: 1.35 mg/dL (ref 0.40–1.50)
GFR: 54.29 mL/min — ABNORMAL LOW (ref >60.00)
Glucose, Bld: 124 mg/dL — ABNORMAL HIGH (ref 70–99)
Potassium: 4.5 mEq/L (ref 3.5–5.1)
Sodium: 137 mEq/L (ref 135–145)

## 2016-04-18 LAB — HEMOGLOBIN A1C: Hgb A1c MFr Bld: 6.8 % — ABNORMAL HIGH (ref 4.6–6.5)

## 2016-04-18 LAB — LIPID PANEL
Cholesterol: 126 mg/dL (ref 0–200)
HDL: 38.6 mg/dL — ABNORMAL LOW (ref >39.00)
LDL Cholesterol: 70 mg/dL (ref 0–99)
NonHDL: 87.72
Total CHOL/HDL Ratio: 3
Triglycerides: 90 mg/dL (ref 0.0–149.0)
VLDL: 18 mg/dL (ref 0.0–40.0)

## 2016-04-18 NOTE — Progress Notes (Signed)
OFFICE VISIT  04/18/2016   CC:  Chief Complaint  Patient presents with  . Follow-up    RCI, pt is fasting.    HPI:    Patient is a 81 y.o. Caucasian male who presents for 4 mo f/u diet controlled DM 2, HTN, HLD, CRI stage III. Says things are feeling good with his knee now.  BP: no home monitoring.  At urol recently his syst was 118.   DM: eats somewhat of a diabetic diet. Exercise: work, + starting to try to walk more now that knee feels better. Taking statin daily w/out side effect.  He has hx of normocytic anemia, possibly related to chronic renal insufficiency. He is on testosterone injections q 2 weeks, recent testost level too high so his urologist has lowered his dose.  He has changed his ASA dose to 81mg  qd.  Past Medical History:  Diagnosis Date  . AICD (automatic cardioverter/defibrillator) present   . Anemia, unspecified   . Arthritis   . BPH with obstruction/lower urinary tract symptoms    Dr. Gaynelle Arabian.  [name] CAD (coronary artery disease)    a. MI/CABG 1996. b. VF arrest 2008: cath with severe native 3v disease, 3/3 grafts widely patent, mild to moderate left ventricular systolic dysfunction secondary to anteroapical scar. EF 40-45%.  . Cardiac arrest [facility]) 2008   2008: aborted cardiac arrest 2008 (c/b encephalopathy, VDRF, aspiriation PNA, CHF, elevated LFTs), s/p St. Jude ICD placement.  . CHF (congestive heart failure) ([place])   . Chronic renal insufficiency, stage 3 (moderate)    GFR 40s  . Chronic venous insufficiency   . Diabetes mellitus without complication ([place])   . Disorder of bone and cartilage, unspecified   . Erectile dysfunction       "           "      "              "                  "    . Essential hypertension   . GERD (gastroesophageal reflux disease)   . Hx of adenomatous colonic polyps 2003, 2013  . Hypercholesterolemia   . Hypogonadism male    managed by [facility] (Dr. Gaynelle Arabian)  . Ischemic cardiomyopathy    a. EF 40-45% by  cath 2008.  EF 39% by nuclear study 2016  . Meniscal injury Jan/Feb 2016   Sustained shoveling snow; repaired by Dr. Gladstone Lighter   . Myocardial infarction   . Obesity   . OSA on CPAP    sleep study 2008, severe OSA: CPAP 11 cm H2O  . Urinary tract infection    diagnosed on 11/13/2015     Past Surgical History:  Procedure Laterality Date  . CARDIAC DEFIBRILLATOR PLACEMENT    . CARDIOVASCULAR STRESS TEST  04/2014   High risk study: large scar, EF 39%, +WM abnl  . COLONOSCOPY  06/10/11   One diminutive polyp removed (tubular adenoma, no high grade dysplasia).  Repeat colonoscopy 5 yrs (Dr. Ardis Hughs).  . COLONOSCOPY W/ POLYPECTOMY  2003   Adenomatous; repeat 2006 was recommended but this was not done until 06/10/2011 (by different GI MD)  . CORONARY ARTERY BYPASS GRAFT  1996   3 vessel  . EP IMPLANTABLE DEVICE N/A 04/27/2015   Procedure: ICD Generator Changeout;  Surgeon: Deboraha Sprang, MD;  Location: [place] CV [facility];  Service: Cardiovascular;  Laterality: N/A;  . KNEE CARTILAGE SURGERY  04/2014   Dr. Gladstone Lighter: torn meniscus repair  . PACEMAKER PLACEMENT  07/2006   St Jude current VRRF--generator change 03/2015  . POLYPECTOMY    . right carpal tunnel release     . TOTAL KNEE ARTHROPLASTY Left 11/21/2015   Procedure: LEFT TOTAL KNEE ARTHROPLASTY;  Surgeon: Latanya Maudlin, MD;  Location: [facility];  Service: Orthopedics;  Laterality: Left;    Outpatient Medications Prior to Visit  Medication Sig Dispense Refill  . atorvastatin (LIPITOR) 20 MG tablet Take 1 tablet (20 mg total) by mouth daily. 90 tablet 1  . b complex vitamins tablet Take 1 tablet by mouth daily.    [name] CALCIUM PO Take 1 tablet by mouth daily.    [name] lisinopril (PRINIVIL,ZESTRIL) 5 MG tablet Take 1 tablet (5 mg total) by mouth daily. 90 tablet 1  . Menthol, Topical Analgesic, (BIOFREEZE EX) Apply 1 application topically daily as needed (pain).    . metoprolol tartrate (LOPRESSOR) 25 MG tablet Take 1 tablet (25 mg total) by mouth 2  (two) times daily. 180 tablet 3  . Multiple Vitamin (MULTIVITAMIN) tablet Take 1 tablet by mouth daily.    . NON FORMULARY Take 1 tablet by mouth daily. OPC 3.  antioxidant protection    . pantoprazole (PROTONIX) 40 MG tablet Take 1 tablet (40 mg total) by mouth daily. 90 tablet 3  . sildenafil (REVATIO) 20 MG tablet Take 20 mg by mouth as needed.    . testosterone cypionate (DEPOTESTOTERONE CYPIONATE) 100 MG/ML injection Inject into the muscle every 28 (twenty-eight) days. For IM use only    . zolpidem (AMBIEN) 5 MG tablet Take 5 mg by mouth at bedtime as needed for sleep.    [name] aspirin 325 MG EC tablet Take 1 tablet (325 mg total) by mouth 2 (two) times daily. To prevent blood clots (Patient not taking: Reported on 04/18/2016) 30 tablet 0  . Glucos-Chondroit-Hyaluron-MSM (GLUCOSAMINE CHONDROITIN JOINT) TABS Take 1 tablet by mouth daily.    [name] KRILL OIL OMEGA-3 PO Take 1 capsule by mouth daily.    . methocarbamol (ROBAXIN) 500 MG tablet Take 1 tablet (500 mg total) by mouth every 6 (six) hours as needed for muscle spasms. (Patient not taking: Reported on 04/18/2016) 40 tablet 1  . oxyCODONE-acetaminophen (PERCOCET/ROXICET) 5-325 MG tablet Take 1-2 tablets by mouth every 4 (four) hours as needed for moderate pain. (Patient not taking: Reported on 04/18/2016) 60 tablet 0   No facility-administered medications prior to visit.     Allergies  Allergen Reactions  . Naproxen Sodium Nausea Only    ROS As per HPI  PE: Blood pressure (!) 169/71, pulse 63, temperature 98 F (36.7 C), temperature source Oral, resp. rate 16, height 5\' 11"  (1.803 m), weight 234 lb 8 oz (106.4 kg), SpO2 96 %. Gen: Alert, well appearing.  Patient is oriented to person, place, time, and situation. AFFECT: pleasant, lucid thought and speech. CV: RRR, no m/r/g.   LUNGS: CTA bilat, nonlabored resps, good aeration in all lung fields. EXT: no clubbing, cyanosis, or edema.    LABS:  Lab Results  Component Value Date   TSH  4.20 03/19/2010   Lab Results  Component Value Date   WBC 12.9 (H) 11/23/2015   HGB 12.4 (L) 11/23/2015   HCT 37.3 (L) 11/23/2015   MCV 93.0 11/23/2015   PLT 325 11/23/2015   Lab Results  Component Value Date   CREATININE 1.47 (H) 11/23/2015   BUN 18 11/23/2015   NA 136 11/23/2015  K 4.4 11/23/2015   CL 104 11/23/2015   CO2 25 11/23/2015   Lab Results  Component Value Date   ALT 21 11/12/2015   AST 23 11/12/2015   ALKPHOS 45 11/12/2015   BILITOT 0.7 11/12/2015   Lab Results  Component Value Date   CHOL 127 03/30/2015   Lab Results  Component Value Date   HDL 42.40 03/30/2015   Lab Results  Component Value Date   LDLCALC 68 03/30/2015   Lab Results  Component Value Date   TRIG 79.0 03/30/2015   Lab Results  Component Value Date   CHOLHDL 3 03/30/2015   Lab Results  Component Value Date   PSA 0.34 12/18/2011   PSA 0.43 03/24/2011   PSA 0.34 03/19/2010   Lab Results  Component Value Date   HGBA1C 6.8 (H) 10/31/2015   IMPRESSION AND PLAN:  1) DM 2, diet controlled. Recheck Hba1c today.  2) HTN: The current medical regimen is effective;  continue present plan and medications. Recheck of bp today 130/68.  3) HLD: on statin for secondary prevention of CV dz. Recheck FLP today.  AST/ALT 10/2015.  4) CRI stage III: recheck lytes/cr.  5) Hx of mild normocytic anemia: recheck CBC today.  An After Visit Summary was printed and given to the patient.  FOLLOW UP: Return in about 4 months (around 08/18/2016).  Signed:  Crissie Sickles, MD           04/18/2016

## 2016-04-18 NOTE — Progress Notes (Signed)
Pre visit review using our clinic review tool, if applicable. No additional management support is needed unless otherwise documented below in the visit note. 

## 2016-04-21 NOTE — Progress Notes (Signed)
Electrophysiology Office Note Date: 04/23/2016  ID:  [name], DOB Dec 04, 1935, MRN [id number]  PCP: Tammi Sou, MD Electrophysiologist: Caryl Comes  CC: Routine ICD follow-up  [name] is a 81 y.o. male seen today for Dr Caryl Comes.  He presents today for routine electrophysiology followup.  Since last being seen in our clinic, the patient reports doing very well. He denies chest pain, palpitations, dyspnea, PND, orthopnea, nausea, vomiting, dizziness, syncope, edema, weight gain, or early satiety.  He has not had ICD shocks.   Device History: STJ single chamber ICD implanted 2008 for ICM; gen change 2017 History of appropriate therapy: No History of AAD therapy: No   Past Medical History:  Diagnosis Date  . AICD (automatic cardioverter/defibrillator) present   . Anemia, unspecified   . Arthritis   . BPH with obstruction/lower urinary tract symptoms    Dr. Gaynelle Arabian.  [name] CAD (coronary artery disease)    a. MI/CABG 1996. b. VF arrest 2008: cath with severe native 3v disease, 3/3 grafts widely patent, mild to moderate left ventricular systolic dysfunction secondary to anteroapical scar. EF 40-45%.  . Cardiac arrest [facility]) 2008   2008: aborted cardiac arrest 2008 (c/b encephalopathy, VDRF, aspiriation PNA, CHF, elevated LFTs), s/p St. Jude ICD placement.  . CHF (congestive heart failure) ([place])   . Chronic renal insufficiency, stage 3 (moderate)    GFR 40s  . Chronic venous insufficiency   . Diabetes mellitus without complication ([place])   . Disorder of bone and cartilage, unspecified   . Erectile dysfunction       "           "      "              "                  "    . Essential hypertension   . GERD (gastroesophageal reflux disease)   . Hx of adenomatous colonic polyps 2003, 2013  . Hypercholesterolemia   . Hypogonadism male    managed by [facility] (Dr. Gaynelle Arabian)  . Ischemic cardiomyopathy    a. EF 40-45% by cath 2008.  EF 39% by nuclear study 2016  .  Meniscal injury Jan/Feb 2016   Sustained shoveling snow; repaired by Dr. Gladstone Lighter   . Myocardial infarction   . Obesity   . OSA on CPAP    sleep study 2008, severe OSA: CPAP 11 cm H2O  . Urinary tract infection    diagnosed on 11/13/2015    Past Surgical History:  Procedure Laterality Date  . CARDIAC DEFIBRILLATOR PLACEMENT    . CARDIOVASCULAR STRESS TEST  04/2014   High risk study: large scar, EF 39%, +WM abnl  . COLONOSCOPY  06/10/11   One diminutive polyp removed (tubular adenoma, no high grade dysplasia).  Repeat colonoscopy 5 yrs (Dr. Ardis Hughs).  . COLONOSCOPY W/ POLYPECTOMY  2003   Adenomatous; repeat 2006 was recommended but this was not done until 06/10/2011 (by different GI MD)  . CORONARY ARTERY BYPASS GRAFT  1996   3 vessel  . EP IMPLANTABLE DEVICE N/A 04/27/2015   Procedure: ICD Generator Changeout;  Surgeon: Deboraha Sprang, MD;  Location: [place] [facility];  Service: Cardiovascular;  Laterality: N/A;  . KNEE CARTILAGE SURGERY  04/2014   Dr. Gladstone Lighter: torn meniscus repair  . PACEMAKER PLACEMENT  07/2006   St Jude current VRRF--generator change 03/2015  . POLYPECTOMY    . right carpal tunnel release     .  TOTAL KNEE ARTHROPLASTY Left 11/21/2015   Procedure: LEFT TOTAL KNEE ARTHROPLASTY;  Surgeon: Latanya Maudlin, MD;  Location: [facility];  Service: Orthopedics;  Laterality: Left;    Current Outpatient Prescriptions  Medication Sig Dispense Refill  . aspirin EC 81 MG tablet Take 81 mg by mouth daily.    [name] atorvastatin (LIPITOR) 20 MG tablet Take 1 tablet (20 mg total) by mouth daily. 90 tablet 1  . b complex vitamins tablet Take 1 tablet by mouth daily.    [name] CALCIUM PO Take 1 tablet by mouth daily.    [name] lisinopril (PRINIVIL,ZESTRIL) 5 MG tablet Take 1 tablet (5 mg total) by mouth daily. 90 tablet 1  . metoprolol tartrate (LOPRESSOR) 25 MG tablet Take 1 tablet (25 mg total) by mouth 2 (two) times daily. 180 tablet 3  . Multiple Vitamin (MULTIVITAMIN) tablet Take 1 tablet by mouth  daily.    . NON FORMULARY Take 1 tablet by mouth daily. OPC 3.  antioxidant protection    . pantoprazole (PROTONIX) 40 MG tablet Take 1 tablet (40 mg total) by mouth daily. 90 tablet 3  . sildenafil (REVATIO) 20 MG tablet Take 20 mg by mouth as needed.    . testosterone cypionate (DEPOTESTOTERONE CYPIONATE) 100 MG/ML injection Inject 100 mg into the muscle every 14 (fourteen) days. For IM use only     . zolpidem (AMBIEN) 5 MG tablet Take 5 mg by mouth at bedtime as needed for sleep.    . Menthol, Topical Analgesic, (BIOFREEZE EX) Apply 1 application topically daily as needed (pain).     No current facility-administered medications for this visit.     Allergies:   Naproxen sodium   Social History: Social History   Social History  . Marital status: Married    Spouse name: N/A  . Number of children: N/A  . Years of education: N/A   Occupational History  . Not on file.   Social History Main Topics  . Smoking status: Former Smoker    Types: Cigarettes    Quit date: 02/17/1970  . Smokeless tobacco: Never Used  . Alcohol use 4.2 oz/week    7 Glasses of wine per week  . Drug use: No  . Sexual activity: Not on file   Other Topics Concern  . Not on file   Social History Narrative   Married, 2 daughters, 4 grandchildren.   Retired Producer, television/film/video for [employer] [phone]).   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   Not sedentary, but has no formal exercise regimen.   Ex-smoker, quit in his 45s.  Drinks one glass of wine daily.   No drug use.    Family History: Family History  Problem Relation Age of Onset  . Heart disease Mother   . Stroke Mother   . Heart disease Father   . Colon cancer Neg Hx   . Esophageal cancer Neg Hx   . Stomach cancer Neg Hx   . Rectal cancer Neg Hx     Review of Systems: All other systems reviewed and are otherwise negative except as noted above.   Physical Exam: VS:  BP 120/70   Pulse 62   Ht 6' (1.829 m)   Wt 235 lb (106.6 kg)   SpO2 96%    BMI 31.87 kg/m  , BMI Body mass index is 31.87 kg/m.  GEN- The patient is well appearing, alert and oriented x 3 today.   HEENT: normocephalic, atraumatic; sclera clear, conjunctiva pink; hearing intact; oropharynx clear; neck  supple  Lungs- Clear to ausculation bilaterally, normal work of breathing.  No wheezes, rales, rhonchi Heart- Regular rate and rhythm, no murmurs, rubs or gallops  GI- soft, non-tender, non-distended, bowel sounds present  Extremities- no clubbing, cyanosis, or edema  MS- no significant deformity or atrophy Skin- warm and dry, no rash or lesion; ICD pocket well healed Psych- euthymic mood, full affect Neuro- strength and sensation are intact  ICD interrogation- reviewed in detail today,  See PACEART report  EKG:  EKG is not ordered today.  Recent Labs: 11/12/2015: ALT 21 04/18/2016: BUN 23; Creatinine, Ser 1.35; Hemoglobin 14.3; Platelets 207.0; Potassium 4.5; Sodium 137   Wt Readings from Last 3 Encounters:  04/23/16 235 lb (106.6 kg)  04/18/16 234 lb 8 oz (106.4 kg)  12/28/15 224 lb 12.8 oz (102 kg)     Other studies Reviewed: Additional studies/ records that were reviewed today include: Dr Olin Pia office notes   Assessment and Plan:  1.  Chronic systolic dysfunction euvolemic today Stable on an appropriate medical regimen Normal ICD function See Pace Art report No changes today  2.  CAD/ICM No recent ischemic symptoms Continue medical therapy with ASA/statin/BB  3.  Obesity Body mass index is 31.87 kg/m.   Current medicines are reviewed at length with the patient today.   The patient does not have concerns regarding his medicines.  The following changes were made today:  none  Labs/ tests ordered today include: none Orders Placed This Encounter  Procedures  . CUP PACEART INCLINIC DEVICE CHECK     Disposition:   Follow up with Delilah Shan, Dr Caryl Comes 1 year    Signed, Chanetta Marshall, NP 04/23/2016 11:22 AM  [facility] [address] [place] [address] [phone] (office) [phone] (fax)

## 2016-04-22 ENCOUNTER — Encounter: Payer: Self-pay | Admitting: *Deleted

## 2016-04-22 DIAGNOSIS — E291 Testicular hypofunction: Secondary | ICD-10-CM | POA: Diagnosis not present

## 2016-04-23 ENCOUNTER — Encounter: Payer: Self-pay | Admitting: Nurse Practitioner

## 2016-04-23 ENCOUNTER — Ambulatory Visit (INDEPENDENT_AMBULATORY_CARE_PROVIDER_SITE_OTHER): Payer: Medicare Other | Admitting: Nurse Practitioner

## 2016-04-23 ENCOUNTER — Ambulatory Visit: Payer: Medicare Other | Admitting: Family Medicine

## 2016-04-23 VITALS — BP 120/70 | HR 62 | Ht 72.0 in | Wt 235.0 lb

## 2016-04-23 DIAGNOSIS — I255 Ischemic cardiomyopathy: Secondary | ICD-10-CM

## 2016-04-23 DIAGNOSIS — I5022 Chronic systolic (congestive) heart failure: Secondary | ICD-10-CM

## 2016-04-23 DIAGNOSIS — I2589 Other forms of chronic ischemic heart disease: Secondary | ICD-10-CM

## 2016-04-23 LAB — CUP PACEART INCLINIC DEVICE CHECK
Date Time Interrogation Session: 20180307110107
Implantable Lead Implant Date: 20080624
Implantable Lead Location: 753860
Implantable Lead Model: 7121
Implantable Pulse Generator Implant Date: 20170310
Pulse Gen Serial Number: 7310877

## 2016-04-23 NOTE — Patient Instructions (Addendum)
Medication Instructions:   Your physician recommends that you continue on your current medications as directed. Please refer to the Current Medication list given to you today.    If you need a refill on your cardiac medications before your next appointment, please call your pharmacy.  Labwork: NONE ORDERED  TODAY    Testing/Procedures: NONE ORDERED  TODAY    Follow-Up:  Your physician wants you to follow-up in: ONE YEAR WITH  KLEIN  You will receive a reminder letter in the mail two months in advance. If you don't receive a letter, please call our office to schedule the follow-up appointment.   Remote monitoring is used to monitor your Pacemaker of ICD from home. This monitoring reduces the number of office visits required to check your device to one time per year. It allows us to keep an eye on the functioning of your device to ensure it is working properly. You are scheduled for a device check from home on .07/24/2016..You may send your transmission at any time that day. If you have a wireless device, the transmission will be sent automatically. After your physician reviews your transmission, you will receive a postcard with your next transmission date.     Any Other Special Instructions Will Be Listed Below (If Applicable).                                                                                                                                                   

## 2016-04-27 ENCOUNTER — Encounter: Payer: Self-pay | Admitting: Family Medicine

## 2016-05-06 DIAGNOSIS — E291 Testicular hypofunction: Secondary | ICD-10-CM | POA: Diagnosis not present

## 2016-05-07 NOTE — Telephone Encounter (Signed)
MyChart message wasn't read.   Pt advised and voiced understanding.

## 2016-05-20 DIAGNOSIS — E291 Testicular hypofunction: Secondary | ICD-10-CM | POA: Diagnosis not present

## 2016-06-03 DIAGNOSIS — E291 Testicular hypofunction: Secondary | ICD-10-CM | POA: Diagnosis not present

## 2016-06-06 ENCOUNTER — Other Ambulatory Visit: Payer: Self-pay | Admitting: *Deleted

## 2016-06-06 MED ORDER — METOPROLOL TARTRATE 25 MG PO TABS: 25.0000 mg | ORAL_TABLET | Freq: Two times a day (BID) | ORAL | 1 refills | Status: DC

## 2016-06-06 NOTE — Telephone Encounter (Signed)
[employer].  RF request for metoprolol LOV: 04/18/16 Next ov: 08/27/16 Last written: 03/30/15 #180 w/ 3RF

## 2016-06-13 DIAGNOSIS — E291 Testicular hypofunction: Secondary | ICD-10-CM | POA: Diagnosis not present

## 2016-06-20 DIAGNOSIS — N400 Enlarged prostate without lower urinary tract symptoms: Secondary | ICD-10-CM | POA: Diagnosis not present

## 2016-06-20 DIAGNOSIS — E291 Testicular hypofunction: Secondary | ICD-10-CM | POA: Diagnosis not present

## 2016-06-20 DIAGNOSIS — N5201 Erectile dysfunction due to arterial insufficiency: Secondary | ICD-10-CM | POA: Diagnosis not present

## 2016-07-04 DIAGNOSIS — E291 Testicular hypofunction: Secondary | ICD-10-CM | POA: Diagnosis not present

## 2016-07-18 DIAGNOSIS — E291 Testicular hypofunction: Secondary | ICD-10-CM | POA: Diagnosis not present

## 2016-07-24 ENCOUNTER — Ambulatory Visit (INDEPENDENT_AMBULATORY_CARE_PROVIDER_SITE_OTHER): Payer: Medicare Other | Admitting: *Deleted

## 2016-07-24 DIAGNOSIS — I255 Ischemic cardiomyopathy: Secondary | ICD-10-CM

## 2016-07-25 NOTE — Progress Notes (Signed)
Remote ICD transmission.   

## 2016-07-30 ENCOUNTER — Encounter: Payer: Self-pay | Admitting: Cardiology

## 2016-08-01 DIAGNOSIS — E291 Testicular hypofunction: Secondary | ICD-10-CM | POA: Diagnosis not present

## 2016-08-05 ENCOUNTER — Encounter: Payer: Self-pay | Admitting: Internal Medicine

## 2016-08-14 ENCOUNTER — Encounter: Payer: Self-pay | Admitting: Cardiology

## 2016-08-19 LAB — CUP PACEART REMOTE DEVICE CHECK
Battery Remaining Longevity: 89 mo
Battery Remaining Percentage: 87 %
Battery Voltage: 3.05 V
Brady Statistic RV Percent Paced: 1 %
Date Time Interrogation Session: 20180606151624
HighPow Impedance: 49 Ohm
HighPow Impedance: 49 Ohm
Implantable Lead Implant Date: 20080624
Implantable Lead Location: 753860
Implantable Lead Model: 7121
Implantable Pulse Generator Implant Date: 20170310
Lead Channel Impedance Value: 440 Ohm
Lead Channel Pacing Threshold Amplitude: 0.75 V
Lead Channel Pacing Threshold Pulse Width: 0.5 ms
Lead Channel Sensing Intrinsic Amplitude: 11.3 mV
Lead Channel Setting Pacing Amplitude: 2.5 V
Lead Channel Setting Pacing Pulse Width: 0.5 ms
Lead Channel Setting Sensing Sensitivity: 0.5 mV
Pulse Gen Serial Number: 7310877

## 2016-08-21 DIAGNOSIS — E291 Testicular hypofunction: Secondary | ICD-10-CM | POA: Diagnosis not present

## 2016-08-27 ENCOUNTER — Encounter: Payer: Self-pay | Admitting: Family Medicine

## 2016-08-27 ENCOUNTER — Ambulatory Visit (INDEPENDENT_AMBULATORY_CARE_PROVIDER_SITE_OTHER): Payer: Medicare Other | Admitting: Family Medicine

## 2016-08-27 VITALS — BP 131/58 | HR 79 | Temp 98.0°F | Resp 16 | Ht 72.0 in | Wt 233.5 lb

## 2016-08-27 DIAGNOSIS — I255 Ischemic cardiomyopathy: Secondary | ICD-10-CM

## 2016-08-27 DIAGNOSIS — E119 Type 2 diabetes mellitus without complications: Secondary | ICD-10-CM

## 2016-08-27 DIAGNOSIS — N183 Chronic kidney disease, stage 3 unspecified: Secondary | ICD-10-CM

## 2016-08-27 DIAGNOSIS — E7800 Pure hypercholesterolemia, unspecified: Secondary | ICD-10-CM

## 2016-08-27 DIAGNOSIS — I10 Essential (primary) hypertension: Secondary | ICD-10-CM | POA: Diagnosis not present

## 2016-08-27 LAB — COMPREHENSIVE METABOLIC PANEL
ALT: 19 U/L (ref 0–53)
AST: 19 U/L (ref 0–37)
Albumin: 4.5 g/dL (ref 3.5–5.2)
Alkaline Phosphatase: 43 U/L (ref 39–117)
BUN: 23 mg/dL (ref 6–23)
CO2: 26 mEq/L (ref 19–32)
Calcium: 9.5 mg/dL (ref 8.4–10.5)
Chloride: 104 mEq/L (ref 96–112)
Creatinine, Ser: 1.46 mg/dL (ref 0.40–1.50)
GFR: 49.55 mL/min — ABNORMAL LOW (ref >60.00)
Glucose, Bld: 123 mg/dL — ABNORMAL HIGH (ref 70–99)
Potassium: 4.6 mEq/L (ref 3.5–5.1)
Sodium: 137 mEq/L (ref 135–145)
Total Bilirubin: 0.7 mg/dL (ref 0.2–1.2)
Total Protein: 7.4 g/dL (ref 6.0–8.3)

## 2016-08-27 LAB — HEMOGLOBIN A1C: Hgb A1c MFr Bld: 7 % — ABNORMAL HIGH (ref 4.6–6.5)

## 2016-08-27 MED ORDER — ZOSTER VAC RECOMB ADJUVANTED 50 MCG/0.5ML IM SUSR: INTRAMUSCULAR | 1 refills | Status: DC

## 2016-08-27 NOTE — Addendum Note (Signed)
Addended by: Onalee Hua on: 08/27/2016 08:26 AM   Modules accepted: Orders

## 2016-08-27 NOTE — Progress Notes (Signed)
OFFICE VISIT  08/27/2016   CC:  Chief Complaint  Patient presents with  . Follow-up    RCI, pt is fasting.    HPI:    Patient is a 81 y.o. Caucasian male who presents for 4 mo f/u diet controlled DM 2, HtN, HLD, and CRI stage III. At last f/u visit all was stable and no changes were made in his management.  Active, played golf yesterday. Active remodeling condo's.  No acute complaints.  Past Medical History:  Diagnosis Date  . AICD (automatic cardioverter/defibrillator) present   . Anemia, unspecified   . Arthritis   . BPH with obstruction/lower urinary tract symptoms    Dr. Gaynelle Arabian.  [name] CAD (coronary artery disease)    a. MI/CABG 1996. b. VF arrest 2008: cath with severe native 3v disease, 3/3 grafts widely patent, mild to moderate left ventricular systolic dysfunction secondary to anteroapical scar. EF 40-45%.  . Cardiac arrest [facility]) 2008   2008: aborted cardiac arrest 2008 (c/b encephalopathy, VDRF, aspiriation PNA, CHF, elevated LFTs), s/p St. Jude ICD placement.  . Chronic left ventricular systolic dysfunction    Ischemic CM  . Chronic renal insufficiency, stage 3 (moderate)    GFR 40s  . Chronic venous insufficiency   . Diabetes mellitus without complication ([place])   . Disorder of bone and cartilage, unspecified   . Erectile dysfunction       "           "      "              "                  "    . Essential hypertension   . GERD (gastroesophageal reflux disease)   . Hx of adenomatous colonic polyps 2003, 2013  . Hypercholesterolemia   . Hypogonadism male    managed by [facility] (Dr. Gaynelle Arabian)  . Ischemic cardiomyopathy    a. EF 40-45% by cath 2008.  EF 39% by nuclear study 2016  . Meniscal injury Jan/Feb 2016   Sustained shoveling snow; repaired by Dr. Gladstone Lighter   . Myocardial infarction ([place])   . Obesity   . OSA on CPAP    sleep study 2008, severe OSA: CPAP 11 cm H2O  . Urinary tract infection    diagnosed on 11/13/2015     Past Surgical  History:  Procedure Laterality Date  . CARDIAC DEFIBRILLATOR PLACEMENT    . CARDIOVASCULAR STRESS TEST  04/2014   High risk study: large scar, EF 39%, +WM abnl  . COLONOSCOPY  06/10/11   One diminutive polyp removed (tubular adenoma, no high grade dysplasia).  Repeat colonoscopy 5 yrs (Dr. Ardis Hughs).  . COLONOSCOPY W/ POLYPECTOMY  2003   Adenomatous; repeat 2006 was recommended but this was not done until 06/10/2011 (by different GI MD)  . CORONARY ARTERY BYPASS GRAFT  1996   3 vessel  . EP IMPLANTABLE DEVICE N/A 04/27/2015   Procedure: ICD Generator Changeout;  Surgeon: Deboraha Sprang, MD;  Location: [place] [facility];  Service: Cardiovascular;  Laterality: N/A;  . KNEE CARTILAGE SURGERY  04/2014   Dr. Gladstone Lighter: torn meniscus repair  . PACEMAKER PLACEMENT  07/2006   St Jude current VRRF--generator change 03/2015  . POLYPECTOMY    . right carpal tunnel release     . TOTAL KNEE ARTHROPLASTY Left 11/21/2015   Procedure: LEFT TOTAL KNEE ARTHROPLASTY;  Surgeon: Latanya Maudlin, MD;  Location: [facility];  Service:  Orthopedics;  Laterality: Left;    Outpatient Medications Prior to Visit  Medication Sig Dispense Refill  . aspirin EC 81 MG tablet Take 81 mg by mouth daily.    [name] atorvastatin (LIPITOR) 20 MG tablet Take 1 tablet (20 mg total) by mouth daily. 90 tablet 1  . b complex vitamins tablet Take 1 tablet by mouth daily.    [name] CALCIUM PO Take 1 tablet by mouth daily.    [name] lisinopril (PRINIVIL,ZESTRIL) 5 MG tablet Take 1 tablet (5 mg total) by mouth daily. 90 tablet 1  . Menthol, Topical Analgesic, (BIOFREEZE EX) Apply 1 application topically daily as needed (pain).    . metoprolol tartrate (LOPRESSOR) 25 MG tablet Take 1 tablet (25 mg total) by mouth 2 (two) times daily. 180 tablet 1  . Multiple Vitamin (MULTIVITAMIN) tablet Take 1 tablet by mouth daily.    . NON FORMULARY Take 1 tablet by mouth daily. OPC 3.  antioxidant protection    . pantoprazole (PROTONIX) 40 MG tablet Take 1 tablet (40 mg  total) by mouth daily. 90 tablet 3  . sildenafil (REVATIO) 20 MG tablet Take 20 mg by mouth as needed.    . testosterone cypionate (DEPOTESTOTERONE CYPIONATE) 100 MG/ML injection Inject 100 mg into the muscle every 14 (fourteen) days. For IM use only     . zolpidem (AMBIEN) 5 MG tablet Take 5 mg by mouth at bedtime as needed for sleep.     No facility-administered medications prior to visit.     Allergies  Allergen Reactions  . Naproxen Sodium Nausea Only    ROS Review of Systems  Constitutional: Negative for fatigue and fever.  HENT: Negative for congestion and sore throat.   Eyes: Negative for visual disturbance.  Respiratory: Negative for cough.   Cardiovascular: Negative for chest pain.  Gastrointestinal: Negative for abdominal pain and nausea.  Genitourinary: Negative for dysuria.  Musculoskeletal: Negative for back pain and joint swelling.  Skin: Negative for rash.  Neurological: Negative for weakness and headaches.  Hematological: Negative for adenopathy.     PE: Blood pressure (!) 131/58, pulse 79, temperature 98 F (36.7 C), temperature source Oral, resp. rate 16, height 6' (1.829 m), weight 233 lb 8 oz (105.9 kg), SpO2 97 %. Gen: Alert, well appearing.  Patient is oriented to person, place, time, and situation. AFFECT: pleasant, lucid thought and speech. CV: RRR, no m/r/g.   LUNGS: CTA bilat, nonlabored resps, good aeration in all lung fields. EXT: no clubbing, cyanosis, or edema.    LABS:  Lab Results  Component Value Date   TSH 4.20 03/19/2010   Lab Results  Component Value Date   WBC 5.2 04/18/2016   HGB 14.3 04/18/2016   HCT 42.4 04/18/2016   MCV 91.2 04/18/2016   PLT 207.0 04/18/2016   Lab Results  Component Value Date   CREATININE 1.35 04/18/2016   BUN 23 04/18/2016   NA 137 04/18/2016   K 4.5 04/18/2016   CL 104 04/18/2016   CO2 26 04/18/2016   Lab Results  Component Value Date   ALT 21 11/12/2015   AST 23 11/12/2015   ALKPHOS 45  11/12/2015   BILITOT 0.7 11/12/2015   Lab Results  Component Value Date   CHOL 126 04/18/2016   Lab Results  Component Value Date   HDL 38.60 (L) 04/18/2016   Lab Results  Component Value Date   LDLCALC 70 04/18/2016   Lab Results  Component Value Date   TRIG 90.0 04/18/2016  Lab Results  Component Value Date   CHOLHDL 3 04/18/2016   Lab Results  Component Value Date   PSA 0.34 12/18/2011   PSA 0.43 03/24/2011   PSA 0.34 03/19/2010   Lab Results  Component Value Date   HGBA1C 6.8 (H) 04/18/2016   IMPRESSION AND PLAN:  1) DM 2: diet controlled. HbA1c today. Feet exam next f/u. Has next eye exam scheduled for 2 wks from now.  2) HTN: The current medical regimen is effective;  continue present plan and medications. Lytes/cr today.  3) HLD: tolerating statin.  Most recent lipids excellent 04/2016. AST/ALT today.  Plan on lipid panel repeat in 4-6 mo.  4) CRI stage III: avoid NSAIDS, focus on good hydration. Lytes/cr today  An After Visit Summary was printed and given to the patient.  FOLLOW UP: Return in about 4 months (around 12/28/2016) for routine chronic illness f/u.  Signed:  Crissie Sickles, MD           08/27/2016

## 2016-09-08 DIAGNOSIS — H25013 Cortical age-related cataract, bilateral: Secondary | ICD-10-CM | POA: Diagnosis not present

## 2016-09-08 DIAGNOSIS — H2513 Age-related nuclear cataract, bilateral: Secondary | ICD-10-CM | POA: Diagnosis not present

## 2016-09-08 DIAGNOSIS — H43813 Vitreous degeneration, bilateral: Secondary | ICD-10-CM | POA: Diagnosis not present

## 2016-09-08 DIAGNOSIS — H25043 Posterior subcapsular polar age-related cataract, bilateral: Secondary | ICD-10-CM | POA: Diagnosis not present

## 2016-10-02 ENCOUNTER — Other Ambulatory Visit: Payer: Self-pay | Admitting: Family Medicine

## 2016-10-23 ENCOUNTER — Ambulatory Visit (INDEPENDENT_AMBULATORY_CARE_PROVIDER_SITE_OTHER): Payer: Medicare Other | Admitting: *Deleted

## 2016-10-23 DIAGNOSIS — I255 Ischemic cardiomyopathy: Secondary | ICD-10-CM

## 2016-10-24 NOTE — Progress Notes (Signed)
Remote ICD transmission.   

## 2016-10-28 ENCOUNTER — Encounter: Payer: Self-pay | Admitting: Cardiology

## 2016-11-18 DIAGNOSIS — M25512 Pain in left shoulder: Secondary | ICD-10-CM | POA: Diagnosis not present

## 2016-11-18 LAB — CUP PACEART REMOTE DEVICE CHECK
Battery Remaining Longevity: 86 mo
Battery Remaining Percentage: 85 %
Battery Voltage: 3.02 V
Brady Statistic RV Percent Paced: 1 %
Date Time Interrogation Session: 20180906062541
HighPow Impedance: 51 Ohm
HighPow Impedance: 51 Ohm
Implantable Lead Implant Date: 20080624
Implantable Lead Location: 753860
Implantable Lead Model: 7121
Implantable Pulse Generator Implant Date: 20170310
Lead Channel Impedance Value: 410 Ohm
Lead Channel Pacing Threshold Amplitude: 0.75 V
Lead Channel Pacing Threshold Pulse Width: 0.5 ms
Lead Channel Sensing Intrinsic Amplitude: 11.3 mV
Lead Channel Setting Pacing Amplitude: 2.5 V
Lead Channel Setting Pacing Pulse Width: 0.5 ms
Lead Channel Setting Sensing Sensitivity: 0.5 mV
Pulse Gen Serial Number: 7310877

## 2016-11-24 ENCOUNTER — Other Ambulatory Visit: Payer: Self-pay | Admitting: Orthopedic Surgery

## 2016-11-24 DIAGNOSIS — G8929 Other chronic pain: Secondary | ICD-10-CM

## 2016-11-24 DIAGNOSIS — M25512 Pain in left shoulder: Principal | ICD-10-CM

## 2016-12-03 ENCOUNTER — Ambulatory Visit
Admission: RE | Admit: 2016-12-03 | Discharge: 2016-12-03 | Disposition: A | Discharge status: Home / Self Care | Payer: Medicare Other | Source: Ambulatory Visit | Attending: Orthopedic Surgery | Admitting: Orthopedic Surgery

## 2016-12-03 DIAGNOSIS — M75100 Unspecified rotator cuff tear or rupture of unspecified shoulder, not specified as traumatic: Secondary | ICD-10-CM

## 2016-12-03 DIAGNOSIS — M25512 Pain in left shoulder: Secondary | ICD-10-CM | POA: Diagnosis not present

## 2016-12-03 DIAGNOSIS — G8929 Other chronic pain: Secondary | ICD-10-CM

## 2016-12-03 DIAGNOSIS — S46012A Strain of muscle(s) and tendon(s) of the rotator cuff of left shoulder, initial encounter: Secondary | ICD-10-CM | POA: Diagnosis not present

## 2016-12-03 HISTORY — DX: Unspecified rotator cuff tear or rupture of unspecified shoulder, not specified as traumatic: M75.100

## 2016-12-03 MED ORDER — IOPAMIDOL (ISOVUE-M 200) INJECTION 41%
15.0000 mL | Freq: Once | INTRAMUSCULAR | Status: AC
  Administered 2016-12-03: 15 mL via INTRA_ARTICULAR

## 2016-12-16 DIAGNOSIS — M75102 Unspecified rotator cuff tear or rupture of left shoulder, not specified as traumatic: Secondary | ICD-10-CM | POA: Diagnosis not present

## 2016-12-16 DIAGNOSIS — M25512 Pain in left shoulder: Secondary | ICD-10-CM | POA: Diagnosis not present

## 2016-12-17 DIAGNOSIS — Z23 Encounter for immunization: Secondary | ICD-10-CM | POA: Diagnosis not present

## 2016-12-19 ENCOUNTER — Telehealth: Payer: Self-pay

## 2016-12-19 NOTE — Telephone Encounter (Signed)
   Chart reviewed as part of pre-operative protocol coverage. Given past medical history, my interview with the patient toady on the phone, and based on ACC/AHA guidelines, Ad Guttman would be at acceptable risk for the planned procedure without further cardiovascular testing.   Kerin Ransom, PA-C 12/19/2016, 4:45 PM

## 2016-12-19 NOTE — Telephone Encounter (Signed)
   [facility] Pre-operative Risk Assessment    Request for surgical clearance:  1. What type of surgery is being performed? LEFT shoulder:open left shoulder RCR w/ possible graft and anchors   2. When is this surgery scheduled? Pending cardiac clearance   3. Are there any medications that need to be held prior to surgery and how long? None stated   4. Practice name and name of physician performing surgery? [facility]/ Ronald A. Gioffre, MD   5. What is your office phone and fax number? Ph [phone]  Fax [phone] [facility]  6. Anesthesia type (None, local, MAC, general) ? Not stated   Tod Persia 12/19/2016, 8:48 AM  _________________________________________________________________   (provider comments below)

## 2016-12-19 NOTE — Telephone Encounter (Signed)
[facility] also needs last pacemaker interogation for anesthesia purposes. Thank you!

## 2016-12-19 NOTE — Telephone Encounter (Signed)
Faxed to Dr Charlestine Night office via [employer].

## 2016-12-25 NOTE — Progress Notes (Signed)
Subjective:   [name] is a 81 y.o. male who presents for Medicare Annual/Subsequent preventive examination.  Review of Systems:  No ROS.  Medicare Wellness Visit. Additional risk factors are reflected in the social history.  Cardiac Risk Factors include: advanced age (>24men, >26 women);diabetes mellitus;dyslipidemia;obesity (BMI >30kg/m2);male gender;hypertension;family history of premature cardiovascular disease   Sleep patterns: Sleeps poorly currently d/t shoulder pain. Uses CPAP.  Home Safety/Smoke Alarms: Feels safe in home. Smoke alarms in place.  Living environment; residence and Firearm Safety: Lives with wife in 2 story home. Seat Belt Safety/Bike Helmet: Wears seat belt.    Male:   CCS-Colonoscopy 06/10/2011, polyps. Recall 5 years. ([place])  Declines further testing.  PSA-Followed by [facility]  Lab Results  Component Value Date   PSA 0.34 12/18/2011   PSA 0.43 03/24/2011   PSA 0.34 03/19/2010       Objective:    Vitals: BP 118/68 (BP Location: Left Arm, Patient Position: Sitting, Cuff Size: Normal)   Pulse (!) 56   Temp 97.8 F (36.6 C) (Oral)   Resp 18   Ht 6' (1.829 m)   Wt 230 lb 1.9 oz (104.4 kg)   SpO2 96%   BMI 31.21 kg/m   Body mass index is 31.21 kg/m.  Tobacco Social History   Tobacco Use  Smoking Status Former Smoker  . Types: Cigarettes  . Last attempt to quit: 02/17/1970  . Years since quitting: 46.8  Smokeless Tobacco Never Used     Counseling given: Not Answered   Past Medical History:  Diagnosis Date  . AICD (automatic cardioverter/defibrillator) present   . Anemia, unspecified   . Arthritis   . BPH with obstruction/lower urinary tract symptoms    Dr. Gaynelle Arabian.  [name] CAD (coronary artery disease)    a. MI/CABG 1996. b. VF arrest 2008: cath with severe native 3v disease, 3/3 grafts widely patent, mild to moderate left ventricular systolic dysfunction secondary to anteroapical scar. EF 40-45%.  . Cardiac arrest  [facility]) 2008   2008: aborted cardiac arrest 2008 (c/b encephalopathy, VDRF, aspiriation PNA, CHF, elevated LFTs), s/p St. Jude ICD placement.  . Chronic left ventricular systolic dysfunction    Ischemic CM  . Chronic renal insufficiency, stage 3 (moderate) (HCC)    GFR 40s  . Chronic venous insufficiency   . Diabetes mellitus without complication ([place])   . Disorder of bone and cartilage, unspecified   . Erectile dysfunction       "           "      "              "                  "    . Essential hypertension   . GERD (gastroesophageal reflux disease)   . Hx of adenomatous colonic polyps 2003, 2013  . Hypercholesterolemia   . Hypogonadism male    managed by [facility] (Dr. Gaynelle Arabian)  . Ischemic cardiomyopathy    a. EF 40-45% by cath 2008.  EF 39% by nuclear study 2016  . Meniscal injury Jan/Feb 2016   Sustained shoveling snow; repaired by Dr. Gladstone Lighter   . Myocardial infarction ([place])   . Obesity   . OSA on CPAP    sleep study 2008, severe OSA: CPAP 11 cm H2O  . Supraspinatus tendon tear 12/03/2016   Left shoulder  . Urinary tract infection    diagnosed on 11/13/2015  Past Surgical History:  Procedure Laterality Date  . CARDIAC DEFIBRILLATOR PLACEMENT    . CARDIOVASCULAR STRESS TEST  04/2014   High risk study: large scar, EF 39%, +WM abnl  . COLONOSCOPY  06/10/11   One diminutive polyp removed (tubular adenoma, no high grade dysplasia).  Repeat colonoscopy 5 yrs (Dr. Ardis Hughs).  . COLONOSCOPY W/ POLYPECTOMY  2003   Adenomatous; repeat 2006 was recommended but this was not done until 06/10/2011 (by different GI MD)  . CORONARY ARTERY BYPASS GRAFT  1996   3 vessel  . KNEE CARTILAGE SURGERY  04/2014   Dr. Gladstone Lighter: torn meniscus repair  . PACEMAKER PLACEMENT  07/2006   St Jude current VRRF--generator change 03/2015  . POLYPECTOMY    . right carpal tunnel release      Family History  Problem Relation Age of Onset  . Heart disease Mother   . Stroke Mother   . Heart disease  Father   . Colon cancer Neg Hx   . Esophageal cancer Neg Hx   . Stomach cancer Neg Hx   . Rectal cancer Neg Hx    Social History   Substance and Sexual Activity  Sexual Activity Not on file    Outpatient Encounter Medications as of 12/26/2016  Medication Sig  . aspirin EC 81 MG tablet Take 81 mg by mouth daily.  [name] atorvastatin (LIPITOR) 20 MG tablet TAKE ONE TABLET BY MOUTH DAILY  . b complex vitamins tablet Take 1 tablet by mouth daily.  [name] CALCIUM PO Take 1 tablet by mouth daily.  [name] ibuprofen (ADVIL,MOTRIN) 200 MG tablet Take 400 mg every 6 (six) hours as needed by mouth.  [name] lisinopril (PRINIVIL,ZESTRIL) 5 MG tablet TAKE ONE TABLET BY MOUTH DAILY  . Menthol, Topical Analgesic, (BIOFREEZE EX) Apply 1 application topically daily as needed (pain).  . metoprolol tartrate (LOPRESSOR) 25 MG tablet Take 1 tablet (25 mg total) by mouth 2 (two) times daily.  . Multiple Vitamin (MULTIVITAMIN) tablet Take 1 tablet by mouth daily.  . NON FORMULARY Take 1 tablet by mouth daily. OPC 3.  antioxidant protection  . pantoprazole (PROTONIX) 40 MG tablet Take 1 tablet (40 mg total) by mouth daily.  . sildenafil (REVATIO) 20 MG tablet Take 20 mg by mouth as needed.  . testosterone cypionate (DEPOTESTOTERONE CYPIONATE) 100 MG/ML injection Inject 100 mg into the muscle every 14 (fourteen) days. For IM use only   . zolpidem (AMBIEN) 5 MG tablet Take 5 mg by mouth at bedtime as needed for sleep.  [name] Zoster Vac Recomb Adjuvanted [facility]) injection Repeat in 2 to 6 months   No facility-administered encounter medications on file as of 12/26/2016.     Activities of Daily Living In your present state of health, do you have any difficulty performing the following activities: 12/26/2016  Hearing? N  Vision? N  Difficulty concentrating or making decisions? N  Walking or climbing stairs? N  Dressing or bathing? N  Doing errands, shopping? N  Preparing Food and eating ? N  Using the Toilet? N  In the past six  months, have you accidently leaked urine? N  Do you have problems with loss of bowel control? N  Managing your Medications? N  Managing your Finances? N  Housekeeping or managing your Housekeeping? N  Some recent data might be hidden    Patient Care Team: Tammi Sou, MD as PCP - General (Family Medicine) Milus Banister, MD as Consulting Physician (Gastroenterology) Linus Mako, MD as Consulting Physician (  Family Medicine) Carolan Clines, MD as Consulting Physician (Urology) Con Memos as Consulting Physician (Pulmonary Disease) Deboraha Sprang, MD as Consulting Physician (Cardiology) Camillo Flaming, [place] as Referring Physician (Optometry) Latanya Maudlin, MD as Consulting Physician (Orthopedic Surgery)   Assessment:    Physical assessment deferred to PCP.  Exercise Activities and Dietary recommendations Current Exercise Habits: The patient does not participate in regular exercise at present(golf 1 time/week), Exercise limited by: None identified   Diet (meal preparation, eat out, water intake, caffeinated beverages, dairy products, fruits and vegetables): Drinks water.   Breakfast: pancakes; eggs; cereal; applesauce with fruit Lunch: fast food if working; Hotel manager: Lean protein and vegetables.     Goals    . Weight (lb) < 215 lb (97.5 kg)     Lose weight by watching diet.       Fall Risk Fall Risk  12/26/2016 07/31/2015 07/21/2014  Falls in the past year? Yes Yes No  Number falls in past yr: 1 1 -  Injury with Fall? No Yes -  Follow up Falls prevention discussed - -   Depression Screen PHQ 2/9 Scores 12/26/2016 07/31/2015 07/21/2014  PHQ - 2 Score 0 0 0    Cognitive Function       Ad8 score reviewed for issues:  Issues making decisions: no  Less interest in hobbies / activities: no  Repeats questions, stories (family complaining): no  Trouble using ordinary gadgets (microwave, computer, phone): no  Forgets the month or year:  no  Mismanaging finances: no  Remembering appts:no  Daily problems with thinking and/or memory: no Ad8 score is=0     Immunization History  Administered Date(s) Administered  . Influenza Whole 12/07/2007, 01/18/2009  . Influenza, High Dose Seasonal PF 11/27/2014, 10/31/2015  . Influenza,inj,Quad PF,6+ Mos 11/18/2013  . Influenza-Unspecified 12/17/2016  . Pneumococcal Conjugate-13 03/14/2013  . Pneumococcal Polysaccharide-23 01/28/2006  . Td 02/17/2002  . Tdap 10/08/2011, 05/09/2012  . Zoster 09/16/2007   Screening Tests Health Maintenance  Topic Date Due  . FOOT EXAM  07/30/2016  . COLONOSCOPY  12/18/2017 (Originally 06/09/2016)  . HEMOGLOBIN A1C  02/27/2017  . OPHTHALMOLOGY EXAM  06/17/2017  . TETANUS/TDAP  05/10/2022  . INFLUENZA VACCINE  Completed  . PNA vac Low Risk Adult  Completed      Plan:    Bring a copy of your living will and/or healthcare power of attorney to your next office visit.  Continue doing brain stimulating activities (puzzles, reading, adult coloring books, staying active) to keep memory sharp.    I have personally reviewed and noted the following in the patient's chart:   . Medical and social history . Use of alcohol, tobacco or illicit drugs  . Current medications and supplements . Functional ability and status . Nutritional status . Physical activity . Advanced directives . List of other physicians . Hospitalizations, surgeries, and ER visits in previous 12 months . Vitals . Screenings to include cognitive, depression, and falls . Referrals and appointments  In addition, I have reviewed and discussed with patient certain preventive protocols, quality metrics, and best practice recommendations. A written personalized care plan for preventive services as well as general preventive health recommendations were provided to patient.     Gerilyn Nestle, RN  12/26/2016

## 2016-12-26 ENCOUNTER — Other Ambulatory Visit: Payer: Self-pay

## 2016-12-26 ENCOUNTER — Ambulatory Visit (INDEPENDENT_AMBULATORY_CARE_PROVIDER_SITE_OTHER): Payer: Medicare Other | Admitting: Family Medicine

## 2016-12-26 ENCOUNTER — Ambulatory Visit: Payer: Medicare Other

## 2016-12-26 ENCOUNTER — Encounter: Payer: Self-pay | Admitting: Family Medicine

## 2016-12-26 VITALS — BP 118/68 | HR 56 | Temp 97.8°F | Resp 18 | Ht 72.0 in | Wt 230.1 lb

## 2016-12-26 DIAGNOSIS — M79671 Pain in right foot: Secondary | ICD-10-CM

## 2016-12-26 DIAGNOSIS — I255 Ischemic cardiomyopathy: Secondary | ICD-10-CM | POA: Diagnosis not present

## 2016-12-26 DIAGNOSIS — E119 Type 2 diabetes mellitus without complications: Secondary | ICD-10-CM | POA: Diagnosis not present

## 2016-12-26 DIAGNOSIS — N183 Chronic kidney disease, stage 3 unspecified: Secondary | ICD-10-CM

## 2016-12-26 DIAGNOSIS — M79672 Pain in left foot: Secondary | ICD-10-CM

## 2016-12-26 DIAGNOSIS — I10 Essential (primary) hypertension: Secondary | ICD-10-CM | POA: Diagnosis not present

## 2016-12-26 DIAGNOSIS — Z0000 Encounter for general adult medical examination without abnormal findings: Secondary | ICD-10-CM | POA: Diagnosis not present

## 2016-12-26 DIAGNOSIS — E7800 Pure hypercholesterolemia, unspecified: Secondary | ICD-10-CM | POA: Diagnosis not present

## 2016-12-26 LAB — BASIC METABOLIC PANEL
BUN: 18 mg/dL (ref 6–23)
CO2: 27 mEq/L (ref 19–32)
Calcium: 10 mg/dL (ref 8.4–10.5)
Chloride: 103 mEq/L (ref 96–112)
Creatinine, Ser: 1.35 mg/dL (ref 0.40–1.50)
GFR: 54.2 mL/min — ABNORMAL LOW (ref >60.00)
Glucose, Bld: 130 mg/dL — ABNORMAL HIGH (ref 70–99)
Potassium: 4.2 mEq/L (ref 3.5–5.1)
Sodium: 138 mEq/L (ref 135–145)

## 2016-12-26 LAB — LIPID PANEL
Cholesterol: 111 mg/dL (ref 0–200)
HDL: 39.9 mg/dL (ref >39.00)
LDL Cholesterol: 56 mg/dL (ref 0–99)
NonHDL: 71.4
Total CHOL/HDL Ratio: 3
Triglycerides: 76 mg/dL (ref 0.0–149.0)
VLDL: 15.2 mg/dL (ref 0.0–40.0)

## 2016-12-26 LAB — HEMOGLOBIN A1C: Hgb A1c MFr Bld: 6.7 % — ABNORMAL HIGH (ref 4.6–6.5)

## 2016-12-26 NOTE — Progress Notes (Signed)
OFFICE VISIT  12/26/2016   CC:  Chief Complaint  Patient presents with  . Medicare Wellness   4 mo f/u [facility]  HPI:    Patient is a 81 y.o. Caucasian male who presents for f/u DM 2, HTN, HLD, and CRI stage III. Has plan for L shoulder surgery next month--rotator cuff repair.  Hx of CABG, MI/ischemic cardiomyopathy--asymptomatic, got cardiac clearance already.  DM: diet controlled-- no home monitoring.  NO polydipsia or polyuria.  HTN: no home bp monitoring.  "I've never had any problem with blood pressure".  CRI: taking ibup nightly for shoulder lately.    FEET: "their ugly, I've got fungus on toenails, two toes rub together L foot, occ bunion pain.  Some stabbing pain occ in No burning, tingling, or numbness.  No hx of ulcer.  ROS: no CP, no SOB, no fevers, no melena or hematochezia, no dizziness, no rash, no palpitations, no myalgias.  Past Medical History:  Diagnosis Date  . AICD (automatic cardioverter/defibrillator) present   . Anemia, unspecified   . Arthritis   . BPH with obstruction/lower urinary tract symptoms    Dr. Gaynelle Arabian.  [name] CAD (coronary artery disease)    a. MI/CABG 1996. b. VF arrest 2008: cath with severe native 3v disease, 3/3 grafts widely patent, mild to moderate left ventricular systolic dysfunction secondary to anteroapical scar. EF 40-45%.  . Cardiac arrest [facility]) 2008   2008: aborted cardiac arrest 2008 (c/b encephalopathy, VDRF, aspiriation PNA, CHF, elevated LFTs), s/p St. Jude ICD placement.  . Chronic left ventricular systolic dysfunction    Ischemic CM  . Chronic renal insufficiency, stage 3 (moderate) (HCC)    GFR 40s  . Chronic venous insufficiency   . Diabetes mellitus without complication ([place])   . Disorder of bone and cartilage, unspecified   . Erectile dysfunction       "           "      "              "                  "    . Essential hypertension   . GERD (gastroesophageal reflux disease)   . Hx of adenomatous colonic polyps  2003, 2013  . Hypercholesterolemia   . Hypogonadism male    managed by [facility] (Dr. Gaynelle Arabian)  . Ischemic cardiomyopathy    a. EF 40-45% by cath 2008.  EF 39% by nuclear study 2016  . Meniscal injury Jan/Feb 2016   Sustained shoveling snow; repaired by Dr. Gladstone Lighter   . Myocardial infarction ([place])   . Obesity   . OSA on CPAP    sleep study 2008, severe OSA: CPAP 11 cm H2O  . Supraspinatus tendon tear 12/03/2016   Left shoulder  . Urinary tract infection    diagnosed on 11/13/2015     Past Surgical History:  Procedure Laterality Date  . CARDIAC DEFIBRILLATOR PLACEMENT    . CARDIOVASCULAR STRESS TEST  04/2014   High risk study: large scar, EF 39%, +WM abnl  . COLONOSCOPY  06/10/11   One diminutive polyp removed (tubular adenoma, no high grade dysplasia).  Repeat colonoscopy 5 yrs (Dr. Ardis Hughs).  . COLONOSCOPY W/ POLYPECTOMY  2003   Adenomatous; repeat 2006 was recommended but this was not done until 06/10/2011 (by different GI MD)  . CORONARY ARTERY BYPASS GRAFT  1996   3 vessel  . KNEE CARTILAGE SURGERY  04/2014  Dr. Gladstone Lighter: torn meniscus repair  . PACEMAKER PLACEMENT  07/2006   St Jude current VRRF--generator change 03/2015  . POLYPECTOMY    . right carpal tunnel release       Outpatient Medications Prior to Visit  Medication Sig Dispense Refill  . aspirin EC 81 MG tablet Take 81 mg by mouth daily.    [name] atorvastatin (LIPITOR) 20 MG tablet TAKE ONE TABLET BY MOUTH DAILY 90 tablet 1  . b complex vitamins tablet Take 1 tablet by mouth daily.    [name] CALCIUM PO Take 1 tablet by mouth daily.    [name] ibuprofen (ADVIL,MOTRIN) 200 MG tablet Take 400 mg every 6 (six) hours as needed by mouth.    [name] lisinopril (PRINIVIL,ZESTRIL) 5 MG tablet TAKE ONE TABLET BY MOUTH DAILY 90 tablet 1  . Menthol, Topical Analgesic, (BIOFREEZE EX) Apply 1 application topically daily as needed (pain).    . metoprolol tartrate (LOPRESSOR) 25 MG tablet Take 1 tablet (25 mg total) by mouth 2 (two) times  daily. 180 tablet 1  . Multiple Vitamin (MULTIVITAMIN) tablet Take 1 tablet by mouth daily.    . NON FORMULARY Take 1 tablet by mouth daily. OPC 3.  antioxidant protection    . pantoprazole (PROTONIX) 40 MG tablet Take 1 tablet (40 mg total) by mouth daily. 90 tablet 3  . sildenafil (REVATIO) 20 MG tablet Take 20 mg by mouth as needed.    . testosterone cypionate (DEPOTESTOTERONE CYPIONATE) 100 MG/ML injection Inject 100 mg into the muscle every 14 (fourteen) days. For IM use only     . zolpidem (AMBIEN) 5 MG tablet Take 5 mg by mouth at bedtime as needed for sleep.    [name] Zoster Vac Recomb Adjuvanted [facility]) injection Repeat in 2 to 6 months 0.5 mL 1   No facility-administered medications prior to visit.     Allergies  Allergen Reactions  . Naproxen Sodium Nausea Only    ROS As per HPI  PE: Blood pressure 118/68, pulse (!) 56, temperature 97.8 F (36.6 C), temperature source Oral, resp. rate 18, height 6' (1.829 m), weight 230 lb 1.9 oz (104.4 kg), SpO2 96 %. Gen: Alert, well appearing.  Patient is oriented to person, place, time, and situation. AFFECT: pleasant, lucid thought and speech. CV: RRR, S1 and S2 distant, no audible murmur, no rub/gallop. Chest is clear, no wheezing or rales. Normal symmetric air entry throughout both lung fields. No chest wall deformities or tenderness. EXT: diffuse peeling and speckled hyperpigmentation changes and 1+ pitting in both LL's. Foot exam -no swelling, tenderness or vascular lesions. Color and temperature is normal.  Sensation is intact. Peripheral pulses are palpable. Toenails are all thickened.  Bunion deformity bilat, 2nd and 3rd toes rub together on L foot.   No ulceration or significant callus.   LABS:  Lab Results  Component Value Date   TSH 4.20 03/19/2010   Lab Results  Component Value Date   WBC 5.2 04/18/2016   HGB 14.3 04/18/2016   HCT 42.4 04/18/2016   MCV 91.2 04/18/2016   PLT 207.0 04/18/2016   Lab Results   Component Value Date   CREATININE 1.46 08/27/2016   BUN 23 08/27/2016   NA 137 08/27/2016   K 4.6 08/27/2016   CL 104 08/27/2016   CO2 26 08/27/2016   Lab Results  Component Value Date   ALT 19 08/27/2016   AST 19 08/27/2016   ALKPHOS 43 08/27/2016   BILITOT 0.7 08/27/2016   Lab Results  Component Value Date   CHOL 126 04/18/2016   Lab Results  Component Value Date   HDL 38.60 (L) 04/18/2016   Lab Results  Component Value Date   LDLCALC 70 04/18/2016   Lab Results  Component Value Date   TRIG 90.0 04/18/2016   Lab Results  Component Value Date   CHOLHDL 3 04/18/2016   Lab Results  Component Value Date   PSA 0.34 12/18/2011   PSA 0.43 03/24/2011   PSA 0.34 03/19/2010   Lab Results  Component Value Date   HGBA1C 7.0 (H) 08/27/2016    IMPRESSION AND PLAN:  1) DM 2; diet controlled. Feet exam today: good sensation but some non-diabetic abnormalities + pain that pt requests podiatry referral for. He'll get the name of the one his wife goes to and call back and then I'll order referral. HbA1c today.,  2) HTN: The current medical regimen is effective;  continue present plan and medications. Lytes/cr today.  3) HLD: tolerating statin.  FLP today.  Hepatic panel normal 08/2016. The current medical regimen is effective;  continue present plan and medications.  4) Preventative health care: all vaccines UTD, including 1 of 2 of the shingrix.  5) CRI: avoiding regular use of NSAIDs.  Hydrates well.   He will d/c his nightly ibup and start tylenol.  6) L shoulder RC tear: he'll have this repaired next month---has already been cleared by cardiology.  An After Visit Summary was printed and given to the patient.  FOLLOW UP: Return in about 6 months (around 06/25/2017) for routine chronic illness f/u.  Signed:  Crissie Sickles, MD           12/26/2016

## 2016-12-26 NOTE — Patient Instructions (Addendum)
Bring a copy of your living will and/or healthcare power of attorney to your next office visit.  Continue doing brain stimulating activities (puzzles, reading, adult coloring books, staying active) to keep memory sharp.    Health Maintenance, Male A healthy lifestyle and preventive care is important for your health and wellness. Ask your health care provider about what schedule of regular examinations is right for you. What should I know about weight and diet? Eat a Healthy Diet  Eat plenty of vegetables, fruits, whole grains, low-fat dairy products, and lean protein.  Do not eat a lot of foods high in solid fats, added sugars, or salt.  Maintain a Healthy Weight Regular exercise can help you achieve or maintain a healthy weight. You should:  Do at least 150 minutes of exercise each week. The exercise should increase your heart rate and make you sweat (moderate-intensity exercise).  Do strength-training exercises at least twice a week.  Watch Your Levels of Cholesterol and Blood Lipids  Have your blood tested for lipids and cholesterol every 5 years starting at 79 years of age. If you are at high risk for heart disease, you should start having your blood tested when you are 79 years old. You may need to have your cholesterol levels checked more often if: ? Your lipid or cholesterol levels are high. ? You are older than 79 years of age. ? You are at high risk for heart disease.  What should I know about cancer screening? Many types of cancers can be detected early and may often be prevented. Lung Cancer  You should be screened every year for lung cancer if: ? You are a current smoker who has smoked for at least 30 years. ? You are a former smoker who has quit within the past 15 years.  Talk to your health care provider about your screening options, when you should start screening, and how often you should be screened.  Colorectal Cancer  Routine colorectal cancer screening  usually begins at 79 years of age and should be repeated every 5-10 years until you are 79 years old. You may need to be screened more often if early forms of precancerous polyps or small growths are found. Your health care provider may recommend screening at an earlier age if you have risk factors for colon cancer.  Your health care provider may recommend using home test kits to check for hidden blood in the stool.  A small camera at the end of a tube can be used to examine your colon (sigmoidoscopy or colonoscopy). This checks for the earliest forms of colorectal cancer.  Prostate and Testicular Cancer  Depending on your age and overall health, your health care provider may do certain tests to screen for prostate and testicular cancer.  Talk to your health care provider about any symptoms or concerns you have about testicular or prostate cancer.  Skin Cancer  Check your skin from head to toe regularly.  Tell your health care provider about any new moles or changes in moles, especially if: ? There is a change in a mole's size, shape, or color. ? You have a mole that is larger than a pencil eraser.  Always use sunscreen. Apply sunscreen liberally and repeat throughout the day.  Protect yourself by wearing long sleeves, pants, a wide-brimmed hat, and sunglasses when outside.  What should I know about heart disease, diabetes, and high blood pressure?  If you are 18-39 years of age, have your blood pressure checked every   3-5 years. If you are 40 years of age or older, have your blood pressure checked every year. You should have your blood pressure measured twice-once when you are at a hospital or clinic, and once when you are not at a hospital or clinic. Record the average of the two measurements. To check your blood pressure when you are not at a hospital or clinic, you can use: ? An automated blood pressure machine at a pharmacy. ? A home blood pressure monitor.  Talk to your health care  provider about your target blood pressure.  If you are between 45-79 years old, ask your health care provider if you should take aspirin to prevent heart disease.  Have regular diabetes screenings by checking your fasting blood sugar level. ? If you are at a normal weight and have a low risk for diabetes, have this test once every three years after the age of 45. ? If you are overweight and have a high risk for diabetes, consider being tested at a younger age or more often.  A one-time screening for abdominal aortic aneurysm (AAA) by ultrasound is recommended for men aged 65-75 years who are current or former smokers. What should I know about preventing infection? Hepatitis B If you have a higher risk for hepatitis B, you should be screened for this virus. Talk with your health care provider to find out if you are at risk for hepatitis B infection. Hepatitis C Blood testing is recommended for:  Everyone born from 1945 through 1965.  Anyone with known risk factors for hepatitis C.  Sexually Transmitted Diseases (STDs)  You should be screened each year for STDs including gonorrhea and chlamydia if: ? You are sexually active and are younger than 79 years of age. ? You are older than 79 years of age and your health care provider tells you that you are at risk for this type of infection. ? Your sexual activity has changed since you were last screened and you are at an increased risk for chlamydia or gonorrhea. Ask your health care provider if you are at risk.  Talk with your health care provider about whether you are at high risk of being infected with HIV. Your health care provider may recommend a prescription medicine to help prevent HIV infection.  What else can I do?  Schedule regular health, dental, and eye exams.  Stay current with your vaccines (immunizations).  Do not use any tobacco products, such as cigarettes, chewing tobacco, and e-cigarettes. If you need help quitting, ask  your health care provider.  Limit alcohol intake to no more than 2 drinks per day. One drink equals 12 ounces of beer, 5 ounces of wine, or 1 ounces of hard liquor.  Do not use street drugs.  Do not share needles.  Ask your health care provider for help if you need support or information about quitting drugs.  Tell your health care provider if you often feel depressed.  Tell your health care provider if you have ever been abused or do not feel safe at home. This information is not intended to replace advice given to you by your health care provider. Make sure you discuss any questions you have with your health care provider. Document Released: 08/02/2007 Document Revised: 10/03/2015 Document Reviewed: 11/07/2014 Elsevier Interactive Patient Education  2018 [employer]  

## 2016-12-26 NOTE — Progress Notes (Signed)
AWV reviewed and agree.  Signed:  Phil McGowen, MD           12/26/2016  

## 2016-12-28 ENCOUNTER — Encounter: Payer: Self-pay | Admitting: Family Medicine

## 2017-01-22 ENCOUNTER — Other Ambulatory Visit: Payer: Self-pay

## 2017-01-22 ENCOUNTER — Ambulatory Visit (INDEPENDENT_AMBULATORY_CARE_PROVIDER_SITE_OTHER): Payer: Medicare Other | Admitting: *Deleted

## 2017-01-22 DIAGNOSIS — I255 Ischemic cardiomyopathy: Secondary | ICD-10-CM

## 2017-01-22 NOTE — Progress Notes (Signed)
Remote ICD transmission.   

## 2017-01-23 NOTE — Patient Instructions (Addendum)
[name]  01/23/2017   Your procedure is scheduled on: 01-28-17  Report to [facility] Main  Entrance Take [place]  elevators to 3rd floor to  [place] at 1130AM.    Call this number if you have problems the morning of surgery [phone]    Remember: ONLY 1 PERSON MAY GO WITH YOU TO SHORT STAY TO GET  READY MORNING OF YOUR SURGERY.     PLEASE BRING CPAP MASK AND TUBING ONLY. DEVICE WILL BE PROVIDED FOR YOU!!    Do not eat food After Midnight. You may have clear liquids from midnight until 730am day of surgery. Nothing by mouth after 730am!     Take these medicines the morning of surgery with A SIP OF WATER: metoprolol, pantoprazole, tylenol if needed, atorvastatin                                 You may not have any metal on your body including hair pins and              piercings  Do not wear jewelry, make-up, lotions, powders or perfumes, deodorant                   Men may shave face and neck.   Do not bring valuables to the hospital. [place].  Contacts, dentures or bridgework may not be worn into surgery.  Leave suitcase in the car. After surgery it may be brought to your room.                 Please read over the following fact sheets you were given: _____________________________________________________________________     CLEAR LIQUID DIET   Foods Allowed                                                                     Foods Excluded  Coffee and tea, regular and decaf                             liquids that you cannot  Plain Jell-O in any flavor                                             see through such as: Fruit ices (not with fruit pulp)                                     milk, soups, orange juice  Iced Popsicles                                    All solid food Carbonated beverages, regular and diet  Cranberry, grape and  apple juices Sports drinks like Gatorade Lightly seasoned clear broth or consume(fat free) Sugar, honey syrup  Sample Menu Breakfast                                Lunch                                     Supper Cranberry juice                    Beef broth                            Chicken broth Jell-O                                     Grape juice                           Apple juice Coffee or tea                        Jell-O                                      Popsicle                                                Coffee or tea                        Coffee or tea  _____________________________________________________________________    How to Manage Your Diabetes Before and After Surgery  Why is it important to control my blood sugar before and after surgery? . Improving blood sugar levels before and after surgery helps healing and can limit problems. . A way of improving blood sugar control is eating a healthy diet by: o  Eating less sugar and carbohydrates o  Increasing activity/exercise o  Talking with your doctor about reaching your blood sugar goals . High blood sugars (greater than 180 mg/dL) can raise your risk of infections and slow your recovery, so you will need to focus on controlling your diabetes during the weeks before surgery. . Make sure that the doctor who takes care of your diabetes knows about your planned surgery including the date and location.  How do I manage my blood sugar before surgery? . Check your blood sugar at least 4 times a day, starting 2 days before surgery, to make sure that the level is not too high or low. o Check your blood sugar the morning of your surgery when you wake up and every 2 hours until you get to the Short Stay unit. . If your blood sugar is less than 70 mg/dL, you will need to treat for low blood sugar: o Do not take insulin. o Treat a low blood sugar (less than 70 mg/dL) with  cup of clear juice (cranberry or apple), 4  glucose tablets, OR  glucose gel. o Recheck blood sugar in 15 minutes after treatment (to make sure it is greater than 70 mg/dL). If your blood sugar is not greater than 70 mg/dL on recheck, call [phone] for further instructions. . Report your blood sugar to the short stay nurse when you get to Short Stay.  . If you are admitted to the hospital after surgery: o Your blood sugar will be checked by the staff and you will probably be given insulin after surgery (instead of oral diabetes medicines) to make sure you have good blood sugar levels. o The goal for blood sugar control after surgery is 80-180 mg/dL.    Patient Signature:  Date:   Nurse Signature:  Date:   Reviewed and Endorsed by [facility] Patient Education Committee, August 2015   [facility] - Preparing for Surgery Before surgery, you can play an important role.  Because skin is not sterile, your skin needs to be as free of germs as possible.  You can reduce the number of germs on your skin by washing with CHG (chlorahexidine gluconate) soap before surgery.  CHG is an antiseptic cleaner which kills germs and bonds with the skin to continue killing germs even after washing. Please DO NOT use if you have an allergy to CHG or antibacterial soaps.  If your skin becomes reddened/irritated stop using the CHG and inform your nurse when you arrive at Short Stay. Do not shave (including legs and underarms) for at least 48 hours prior to the first CHG shower.  You may shave your face/neck. Please follow these instructions carefully:  1.  Shower with CHG Soap the night before surgery and the  morning of Surgery.  2.  If you choose to wash your hair, wash your hair first as usual with your  normal  shampoo.  3.  After you shampoo, rinse your hair and body thoroughly to remove the  shampoo.                           4.  Use CHG as you would any other liquid soap.  You can apply chg directly  to the skin and wash                       Gently  with a scrungie or clean washcloth.  5.  Apply the CHG Soap to your body ONLY FROM THE NECK DOWN.   Do not use on face/ open                           Wound or open sores. Avoid contact with eyes, ears mouth and genitals (private parts).                       Wash face,  Genitals (private parts) with your normal soap.             6.  Wash thoroughly, paying special attention to the area where your surgery  will be performed.  7.  Thoroughly rinse your body with warm water from the neck down.  8.  DO NOT shower/wash with your normal soap after using and rinsing off  the CHG Soap.                9.  Pat yourself dry with a clean towel.            10.  Wear clean pajamas.            11.  Place clean sheets on your bed the night of your first shower and do not  sleep with pets. Day of Surgery : Do not apply any lotions/deodorants the morning of surgery.  Please wear clean clothes to the hospital/surgery center.  FAILURE TO FOLLOW THESE INSTRUCTIONS MAY RESULT IN THE CANCELLATION OF YOUR SURGERY PATIENT SIGNATURE_________________________________  NURSE SIGNATURE__________________________________  ________________________________________________________________________   [name]  An incentive spirometer is a tool that can help keep your lungs clear and active. This tool measures how well you are filling your lungs with each breath. Taking long deep breaths may help reverse or decrease the chance of developing breathing (pulmonary) problems (especially infection) following:  A long period of time when you are unable to move or be active. BEFORE THE PROCEDURE   If the spirometer includes an indicator to show your best effort, your nurse or respiratory therapist will set it to a desired goal.  If possible, sit up straight or lean slightly forward. Try not to slouch.  Hold the incentive spirometer in an upright position. INSTRUCTIONS FOR USE  1. Sit on the edge of your bed if  possible, or sit up as far as you can in bed or on a chair. 2. Hold the incentive spirometer in an upright position. 3. Breathe out normally. 4. Place the mouthpiece in your mouth and seal your lips tightly around it. 5. Breathe in slowly and as deeply as possible, raising the piston or the ball toward the top of the column. 6. Hold your breath for 3-5 seconds or for as long as possible. Allow the piston or ball to fall to the bottom of the column. 7. Remove the mouthpiece from your mouth and breathe out normally. 8. Rest for a few seconds and repeat Steps 1 through 7 at least 10 times every 1-2 hours when you are awake. Take your time and take a few normal breaths between deep breaths. 9. The spirometer may include an indicator to show your best effort. Use the indicator as a goal to work toward during each repetition. 10. After each set of 10 deep breaths, practice coughing to be sure your lungs are clear. If you have an incision (the cut made at the time of surgery), support your incision when coughing by placing a pillow or rolled up towels firmly against it. Once you are able to get out of bed, walk around indoors and cough well. You may stop using the incentive spirometer when instructed by your caregiver.  RISKS AND COMPLICATIONS  Take your time so you do not get dizzy or light-headed.  If you are in pain, you may need to take or ask for pain medication before doing incentive spirometry. It is harder to take a deep breath if you are having pain. AFTER USE  Rest and breathe slowly and easily.  It can be helpful to keep track of a log of your progress. Your caregiver can provide you with a simple table to help with this. If you are using the spirometer at home, follow these instructions: [place] IF:   You are having difficultly using the spirometer.  You have trouble using the spirometer as often as instructed.  Your pain medication is not giving enough relief while using  the spirometer.  You develop fever of 100.5 F (38.1 C) or higher. SEEK IMMEDIATE MEDICAL CARE IF:   You cough up bloody sputum that  had not been present before.  You develop fever of 102 F (38.9 C) or greater.  You develop worsening pain at or near the incision site. MAKE SURE YOU:   Understand these instructions.  Will watch your condition.  Will get help right away if you are not doing well or get worse. Document Released: 06/16/2006 Document Revised: 04/28/2011 Document Reviewed: 08/17/2006 [employer] Patient Information 2014 [place], [place].   ________________________________________________________________________

## 2017-01-23 NOTE — Progress Notes (Signed)
Cardiac clearance Kerin Ransom PA 12-19-16 epic telephone note / on chart   ICD orders on cahrt 01-19-17 dr Caryl Comes   [id number] 12-26-16 epic   [facility] 04-23-16; AMBER SEILER  NP  In [employer]

## 2017-01-26 ENCOUNTER — Other Ambulatory Visit: Payer: Self-pay

## 2017-01-26 ENCOUNTER — Encounter (HOSPITAL_COMMUNITY): Payer: Self-pay

## 2017-01-26 ENCOUNTER — Encounter (HOSPITAL_COMMUNITY)
Admission: RE | Admit: 2017-01-26 | Discharge: 2017-01-26 | Disposition: A | Discharge status: Home / Self Care | Payer: Medicare Other | Source: Ambulatory Visit | Attending: Orthopedic Surgery | Admitting: Orthopedic Surgery

## 2017-01-26 DIAGNOSIS — I872 Venous insufficiency (chronic) (peripheral): Secondary | ICD-10-CM | POA: Diagnosis not present

## 2017-01-26 DIAGNOSIS — I255 Ischemic cardiomyopathy: Secondary | ICD-10-CM | POA: Diagnosis not present

## 2017-01-26 DIAGNOSIS — N183 Chronic kidney disease, stage 3 (moderate): Secondary | ICD-10-CM | POA: Diagnosis not present

## 2017-01-26 DIAGNOSIS — M7542 Impingement syndrome of left shoulder: Secondary | ICD-10-CM | POA: Diagnosis not present

## 2017-01-26 DIAGNOSIS — Z79899 Other long term (current) drug therapy: Secondary | ICD-10-CM | POA: Diagnosis not present

## 2017-01-26 DIAGNOSIS — I252 Old myocardial infarction: Secondary | ICD-10-CM | POA: Diagnosis not present

## 2017-01-26 DIAGNOSIS — Z9989 Dependence on other enabling machines and devices: Secondary | ICD-10-CM | POA: Diagnosis not present

## 2017-01-26 DIAGNOSIS — E7800 Pure hypercholesterolemia, unspecified: Secondary | ICD-10-CM | POA: Diagnosis not present

## 2017-01-26 DIAGNOSIS — K219 Gastro-esophageal reflux disease without esophagitis: Secondary | ICD-10-CM | POA: Diagnosis not present

## 2017-01-26 DIAGNOSIS — Z886 Allergy status to analgesic agent status: Secondary | ICD-10-CM | POA: Diagnosis not present

## 2017-01-26 DIAGNOSIS — Z86010 Personal history of colonic polyps: Secondary | ICD-10-CM | POA: Diagnosis not present

## 2017-01-26 DIAGNOSIS — N529 Male erectile dysfunction, unspecified: Secondary | ICD-10-CM | POA: Diagnosis not present

## 2017-01-26 DIAGNOSIS — Z95810 Presence of automatic (implantable) cardiac defibrillator: Secondary | ICD-10-CM | POA: Diagnosis not present

## 2017-01-26 DIAGNOSIS — E1122 Type 2 diabetes mellitus with diabetic chronic kidney disease: Secondary | ICD-10-CM | POA: Diagnosis not present

## 2017-01-26 DIAGNOSIS — M75122 Complete rotator cuff tear or rupture of left shoulder, not specified as traumatic: Secondary | ICD-10-CM | POA: Diagnosis not present

## 2017-01-26 DIAGNOSIS — G4733 Obstructive sleep apnea (adult) (pediatric): Secondary | ICD-10-CM | POA: Diagnosis not present

## 2017-01-26 DIAGNOSIS — I129 Hypertensive chronic kidney disease with stage 1 through stage 4 chronic kidney disease, or unspecified chronic kidney disease: Secondary | ICD-10-CM | POA: Diagnosis not present

## 2017-01-26 DIAGNOSIS — I2510 Atherosclerotic heart disease of native coronary artery without angina pectoris: Secondary | ICD-10-CM | POA: Diagnosis not present

## 2017-01-26 DIAGNOSIS — E291 Testicular hypofunction: Secondary | ICD-10-CM | POA: Diagnosis not present

## 2017-01-26 DIAGNOSIS — Z7982 Long term (current) use of aspirin: Secondary | ICD-10-CM | POA: Diagnosis not present

## 2017-01-26 DIAGNOSIS — E669 Obesity, unspecified: Secondary | ICD-10-CM | POA: Diagnosis not present

## 2017-01-26 DIAGNOSIS — Z6830 Body mass index (BMI) 30.0-30.9, adult: Secondary | ICD-10-CM | POA: Diagnosis not present

## 2017-01-26 DIAGNOSIS — Z951 Presence of aortocoronary bypass graft: Secondary | ICD-10-CM | POA: Diagnosis not present

## 2017-01-26 DIAGNOSIS — Z8674 Personal history of sudden cardiac arrest: Secondary | ICD-10-CM | POA: Diagnosis not present

## 2017-01-26 LAB — COMPREHENSIVE METABOLIC PANEL
ALT: 30 U/L (ref 17–63)
AST: 27 U/L (ref 15–41)
Albumin: 4.2 g/dL (ref 3.5–5.0)
Alkaline Phosphatase: 56 U/L (ref 38–126)
Anion gap: 6 (ref 5–15)
BUN: 22 mg/dL — ABNORMAL HIGH (ref 6–20)
CO2: 26 mmol/L (ref 22–32)
Calcium: 9.5 mg/dL (ref 8.9–10.3)
Chloride: 107 mmol/L (ref 101–111)
Creatinine, Ser: 1.36 mg/dL — ABNORMAL HIGH (ref 0.61–1.24)
GFR calc Af Amer: 55 mL/min — ABNORMAL LOW (ref >60)
GFR calc non Af Amer: 48 mL/min — ABNORMAL LOW (ref >60)
Glucose, Bld: 104 mg/dL — ABNORMAL HIGH (ref 65–99)
Potassium: 4.5 mmol/L (ref 3.5–5.1)
Sodium: 139 mmol/L (ref 135–145)
Total Bilirubin: 0.7 mg/dL (ref 0.3–1.2)
Total Protein: 7.6 g/dL (ref 6.5–8.1)

## 2017-01-26 LAB — CBC WITH DIFFERENTIAL/PLATELET
Basophils Absolute: 0 10*3/uL (ref 0.0–0.1)
Basophils Relative: 0 %
Eosinophils Absolute: 0.2 10*3/uL (ref 0.0–0.7)
Eosinophils Relative: 3 %
HCT: 38 % — ABNORMAL LOW (ref 39.0–52.0)
Hemoglobin: 13 g/dL (ref 13.0–17.0)
Lymphocytes Relative: 29 %
Lymphs Abs: 1.9 10*3/uL (ref 0.7–4.0)
MCH: 31.6 pg (ref 26.0–34.0)
MCHC: 34.2 g/dL (ref 30.0–36.0)
MCV: 92.2 fL (ref 78.0–100.0)
Monocytes Absolute: 0.6 10*3/uL (ref 0.1–1.0)
Monocytes Relative: 9 %
Neutro Abs: 3.9 10*3/uL (ref 1.7–7.7)
Neutrophils Relative %: 59 %
Platelets: 222 10*3/uL (ref 150–400)
RBC: 4.12 MIL/uL — ABNORMAL LOW (ref 4.22–5.81)
RDW: 13.1 % (ref 11.5–15.5)
WBC: 6.7 10*3/uL (ref 4.0–10.5)

## 2017-01-26 LAB — PROTIME-INR
INR: 0.97
Prothrombin Time: 12.8 seconds (ref 11.4–15.2)

## 2017-01-26 LAB — APTT: aPTT: 27 seconds (ref 24–36)

## 2017-01-26 NOTE — Progress Notes (Signed)
CMp done 01/26/17 faxed via epic to Dr Gladstone Lighter.

## 2017-01-27 NOTE — Progress Notes (Signed)
Final EKG done 01/26/17-epic

## 2017-01-27 NOTE — Progress Notes (Signed)
Attempted to call [facility] on 01/26/17 with being on hold for several minutes with no answer.  Called [facility] on 01/27/2017.  [facility] to page Karilyn Cota- Rep.

## 2017-01-28 ENCOUNTER — Encounter (HOSPITAL_COMMUNITY)
Admission: RE | Disposition: A | Discharge status: Home / Self Care | Payer: Self-pay | Source: Ambulatory Visit | Attending: Orthopedic Surgery

## 2017-01-28 ENCOUNTER — Observation Stay (HOSPITAL_COMMUNITY)
Admission: RE | Admit: 2017-01-28 | Discharge: 2017-01-29 | Disposition: A | Discharge status: Home / Self Care | Payer: Medicare Other | Source: Ambulatory Visit | Attending: Orthopedic Surgery | Admitting: Orthopedic Surgery

## 2017-01-28 ENCOUNTER — Other Ambulatory Visit: Payer: Self-pay

## 2017-01-28 ENCOUNTER — Ambulatory Visit (HOSPITAL_COMMUNITY): Payer: Medicare Other | Admitting: Anesthesiology

## 2017-01-28 ENCOUNTER — Encounter (HOSPITAL_COMMUNITY): Payer: Self-pay | Admitting: *Deleted

## 2017-01-28 DIAGNOSIS — E119 Type 2 diabetes mellitus without complications: Secondary | ICD-10-CM | POA: Diagnosis not present

## 2017-01-28 DIAGNOSIS — I252 Old myocardial infarction: Secondary | ICD-10-CM | POA: Insufficient documentation

## 2017-01-28 DIAGNOSIS — N529 Male erectile dysfunction, unspecified: Secondary | ICD-10-CM | POA: Insufficient documentation

## 2017-01-28 DIAGNOSIS — M7542 Impingement syndrome of left shoulder: Secondary | ICD-10-CM | POA: Insufficient documentation

## 2017-01-28 DIAGNOSIS — Z6830 Body mass index (BMI) 30.0-30.9, adult: Secondary | ICD-10-CM | POA: Insufficient documentation

## 2017-01-28 DIAGNOSIS — K219 Gastro-esophageal reflux disease without esophagitis: Secondary | ICD-10-CM | POA: Diagnosis not present

## 2017-01-28 DIAGNOSIS — I10 Essential (primary) hypertension: Secondary | ICD-10-CM | POA: Diagnosis not present

## 2017-01-28 DIAGNOSIS — I129 Hypertensive chronic kidney disease with stage 1 through stage 4 chronic kidney disease, or unspecified chronic kidney disease: Secondary | ICD-10-CM | POA: Insufficient documentation

## 2017-01-28 DIAGNOSIS — I2510 Atherosclerotic heart disease of native coronary artery without angina pectoris: Secondary | ICD-10-CM | POA: Insufficient documentation

## 2017-01-28 DIAGNOSIS — E1122 Type 2 diabetes mellitus with diabetic chronic kidney disease: Secondary | ICD-10-CM | POA: Diagnosis not present

## 2017-01-28 DIAGNOSIS — N183 Chronic kidney disease, stage 3 (moderate): Secondary | ICD-10-CM | POA: Diagnosis not present

## 2017-01-28 DIAGNOSIS — G4733 Obstructive sleep apnea (adult) (pediatric): Secondary | ICD-10-CM | POA: Insufficient documentation

## 2017-01-28 DIAGNOSIS — Z96652 Presence of left artificial knee joint: Secondary | ICD-10-CM | POA: Insufficient documentation

## 2017-01-28 DIAGNOSIS — Z87891 Personal history of nicotine dependence: Secondary | ICD-10-CM | POA: Insufficient documentation

## 2017-01-28 DIAGNOSIS — Z95810 Presence of automatic (implantable) cardiac defibrillator: Secondary | ICD-10-CM | POA: Insufficient documentation

## 2017-01-28 DIAGNOSIS — Z886 Allergy status to analgesic agent status: Secondary | ICD-10-CM | POA: Insufficient documentation

## 2017-01-28 DIAGNOSIS — G8918 Other acute postprocedural pain: Secondary | ICD-10-CM | POA: Diagnosis not present

## 2017-01-28 DIAGNOSIS — I872 Venous insufficiency (chronic) (peripheral): Secondary | ICD-10-CM | POA: Insufficient documentation

## 2017-01-28 DIAGNOSIS — E291 Testicular hypofunction: Secondary | ICD-10-CM | POA: Insufficient documentation

## 2017-01-28 DIAGNOSIS — E669 Obesity, unspecified: Secondary | ICD-10-CM | POA: Insufficient documentation

## 2017-01-28 DIAGNOSIS — M75122 Complete rotator cuff tear or rupture of left shoulder, not specified as traumatic: Secondary | ICD-10-CM | POA: Diagnosis not present

## 2017-01-28 DIAGNOSIS — Z7982 Long term (current) use of aspirin: Secondary | ICD-10-CM | POA: Insufficient documentation

## 2017-01-28 DIAGNOSIS — I255 Ischemic cardiomyopathy: Secondary | ICD-10-CM | POA: Insufficient documentation

## 2017-01-28 DIAGNOSIS — Z951 Presence of aortocoronary bypass graft: Secondary | ICD-10-CM | POA: Insufficient documentation

## 2017-01-28 DIAGNOSIS — Z79899 Other long term (current) drug therapy: Secondary | ICD-10-CM | POA: Insufficient documentation

## 2017-01-28 DIAGNOSIS — Z8674 Personal history of sudden cardiac arrest: Secondary | ICD-10-CM | POA: Insufficient documentation

## 2017-01-28 DIAGNOSIS — M75102 Unspecified rotator cuff tear or rupture of left shoulder, not specified as traumatic: Secondary | ICD-10-CM | POA: Diagnosis present

## 2017-01-28 DIAGNOSIS — E7800 Pure hypercholesterolemia, unspecified: Secondary | ICD-10-CM | POA: Diagnosis not present

## 2017-01-28 DIAGNOSIS — Z86010 Personal history of colonic polyps: Secondary | ICD-10-CM | POA: Insufficient documentation

## 2017-01-28 DIAGNOSIS — Z9989 Dependence on other enabling machines and devices: Secondary | ICD-10-CM | POA: Insufficient documentation

## 2017-01-28 HISTORY — PX: SHOULDER OPEN ROTATOR CUFF REPAIR: SHX2407

## 2017-01-28 LAB — GLUCOSE, CAPILLARY: Glucose-Capillary: 128 mg/dL — ABNORMAL HIGH (ref 65–99)

## 2017-01-28 SURGERY — REPAIR, ROTATOR CUFF, OPEN
Anesthesia: General | Site: Shoulder | Laterality: Left

## 2017-01-28 MED ORDER — HYDROMORPHONE HCL 1 MG/ML IJ SOLN
INTRAMUSCULAR | Status: AC
  Filled 2017-01-28: qty 1

## 2017-01-28 MED ORDER — LIDOCAINE 2% (20 MG/ML) 5 ML SYRINGE
INTRAMUSCULAR | Status: DC | PRN
  Administered 2017-01-28: 100 mg via INTRAVENOUS

## 2017-01-28 MED ORDER — LACTATED RINGERS IV SOLN
INTRAVENOUS | Status: DC
  Administered 2017-01-28 (×2): via INTRAVENOUS

## 2017-01-28 MED ORDER — MIDAZOLAM HCL 2 MG/2ML IJ SOLN
2.0000 mg | Freq: Once | INTRAMUSCULAR | Status: AC
  Administered 2017-01-28: 2 mg via INTRAVENOUS

## 2017-01-28 MED ORDER — ROCURONIUM BROMIDE 10 MG/ML (PF) SYRINGE
PREFILLED_SYRINGE | INTRAVENOUS | Status: DC | PRN
  Administered 2017-01-28: 40 mg via INTRAVENOUS

## 2017-01-28 MED ORDER — HYDROMORPHONE HCL 1 MG/ML IJ SOLN: 0.5000 mg | INTRAMUSCULAR | Status: DC | PRN

## 2017-01-28 MED ORDER — ONDANSETRON HCL 4 MG/2ML IJ SOLN
INTRAMUSCULAR | Status: AC
  Filled 2017-01-28: qty 2

## 2017-01-28 MED ORDER — PROPOFOL 10 MG/ML IV BOLUS
INTRAVENOUS | Status: AC
  Filled 2017-01-28: qty 20

## 2017-01-28 MED ORDER — METOCLOPRAMIDE HCL 5 MG/ML IJ SOLN: 5.0000 mg | Freq: Three times a day (TID) | INTRAMUSCULAR | Status: DC | PRN

## 2017-01-28 MED ORDER — LACTATED RINGERS IV SOLN
INTRAVENOUS | Status: DC | PRN
  Administered 2017-01-28: 14:00:00 via INTRAVENOUS

## 2017-01-28 MED ORDER — METOPROLOL TARTRATE 25 MG PO TABS
25.0000 mg | ORAL_TABLET | Freq: Two times a day (BID) | ORAL | Status: DC
  Administered 2017-01-29: 10:00:00 25 mg via ORAL
  Filled 2017-01-28: qty 1

## 2017-01-28 MED ORDER — DEXAMETHASONE SODIUM PHOSPHATE 10 MG/ML IJ SOLN
INTRAMUSCULAR | Status: AC
  Filled 2017-01-28: qty 1

## 2017-01-28 MED ORDER — HYDROCODONE-ACETAMINOPHEN 5-325 MG PO TABS
1.0000 | ORAL_TABLET | ORAL | Status: DC | PRN
  Administered 2017-01-29: 1 via ORAL
  Filled 2017-01-28: qty 1

## 2017-01-28 MED ORDER — FENTANYL CITRATE (PF) 100 MCG/2ML IJ SOLN
INTRAMUSCULAR | Status: DC | PRN
  Administered 2017-01-28: 50 ug via INTRAVENOUS

## 2017-01-28 MED ORDER — SODIUM CHLORIDE 0.9 % IV SOLN
INTRAVENOUS | Status: AC
  Filled 2017-01-28: qty 500000

## 2017-01-28 MED ORDER — LIDOCAINE 2% (20 MG/ML) 5 ML SYRINGE
INTRAMUSCULAR | Status: AC
  Filled 2017-01-28: qty 5

## 2017-01-28 MED ORDER — LISINOPRIL 5 MG PO TABS
5.0000 mg | ORAL_TABLET | Freq: Every day | ORAL | Status: DC
  Administered 2017-01-29: 5 mg via ORAL
  Filled 2017-01-28: qty 1

## 2017-01-28 MED ORDER — ROCURONIUM BROMIDE 50 MG/5ML IV SOSY
PREFILLED_SYRINGE | INTRAVENOUS | Status: AC
  Filled 2017-01-28: qty 5

## 2017-01-28 MED ORDER — BISACODYL 5 MG PO TBEC: 5.0000 mg | DELAYED_RELEASE_TABLET | Freq: Every day | ORAL | Status: DC | PRN

## 2017-01-28 MED ORDER — POLYETHYLENE GLYCOL 3350 17 G PO PACK: 17.0000 g | PACK | Freq: Every day | ORAL | Status: DC | PRN

## 2017-01-28 MED ORDER — DEXAMETHASONE SODIUM PHOSPHATE 10 MG/ML IJ SOLN
INTRAMUSCULAR | Status: DC | PRN
  Administered 2017-01-28: 10 mg via INTRAVENOUS

## 2017-01-28 MED ORDER — FENTANYL CITRATE (PF) 100 MCG/2ML IJ SOLN
100.0000 ug | Freq: Once | INTRAMUSCULAR | Status: AC
  Administered 2017-01-28: 100 ug via INTRAVENOUS

## 2017-01-28 MED ORDER — MEPERIDINE HCL 50 MG/ML IJ SOLN: 6.2500 mg | INTRAMUSCULAR | Status: DC | PRN

## 2017-01-28 MED ORDER — CEFAZOLIN SODIUM-DEXTROSE 1-4 GM/50ML-% IV SOLN
1.0000 g | Freq: Four times a day (QID) | INTRAVENOUS | Status: AC
  Administered 2017-01-28 – 2017-01-29 (×3): 1 g via INTRAVENOUS
  Filled 2017-01-28 (×3): qty 50

## 2017-01-28 MED ORDER — MIDAZOLAM HCL 2 MG/2ML IJ SOLN
INTRAMUSCULAR | Status: AC
  Administered 2017-01-28: 2 mg via INTRAVENOUS
  Filled 2017-01-28: qty 2

## 2017-01-28 MED ORDER — PROPOFOL 10 MG/ML IV BOLUS
INTRAVENOUS | Status: DC | PRN
  Administered 2017-01-28: 160 mg via INTRAVENOUS

## 2017-01-28 MED ORDER — ACETAMINOPHEN 325 MG PO TABS: 650.0000 mg | ORAL_TABLET | ORAL | Status: DC | PRN

## 2017-01-28 MED ORDER — FENTANYL CITRATE (PF) 100 MCG/2ML IJ SOLN
INTRAMUSCULAR | Status: AC
  Administered 2017-01-28: 100 ug via INTRAVENOUS
  Filled 2017-01-28: qty 2

## 2017-01-28 MED ORDER — PHENOL 1.4 % MT LIQD: 1.0000 | OROMUCOSAL | Status: DC | PRN

## 2017-01-28 MED ORDER — ESMOLOL HCL 100 MG/10ML IV SOLN
INTRAVENOUS | Status: AC
  Filled 2017-01-28: qty 10

## 2017-01-28 MED ORDER — HYDROCODONE-ACETAMINOPHEN 10-325 MG PO TABS: 2.0000 | ORAL_TABLET | ORAL | Status: DC | PRN

## 2017-01-28 MED ORDER — CEFAZOLIN SODIUM-DEXTROSE 2-4 GM/100ML-% IV SOLN
2.0000 g | INTRAVENOUS | Status: AC
  Administered 2017-01-28: 2 g via INTRAVENOUS
  Filled 2017-01-28: qty 100

## 2017-01-28 MED ORDER — EPHEDRINE 5 MG/ML INJ
INTRAVENOUS | Status: AC
  Filled 2017-01-28: qty 10

## 2017-01-28 MED ORDER — EPHEDRINE SULFATE-NACL 50-0.9 MG/10ML-% IV SOSY
PREFILLED_SYRINGE | INTRAVENOUS | Status: DC | PRN
  Administered 2017-01-28: 10 mg via INTRAVENOUS
  Administered 2017-01-28: 15 mg via INTRAVENOUS

## 2017-01-28 MED ORDER — METOCLOPRAMIDE HCL 5 MG PO TABS: 5.0000 mg | ORAL_TABLET | Freq: Three times a day (TID) | ORAL | Status: DC | PRN

## 2017-01-28 MED ORDER — HYDROCODONE-ACETAMINOPHEN 5-325 MG PO TABS: 1.0000 | ORAL_TABLET | Freq: Four times a day (QID) | ORAL | 0 refills | Status: DC | PRN

## 2017-01-28 MED ORDER — ACETAMINOPHEN 10 MG/ML IV SOLN
INTRAVENOUS | Status: AC
  Filled 2017-01-28: qty 100

## 2017-01-28 MED ORDER — LACTATED RINGERS IV SOLN
INTRAVENOUS | Status: DC
  Administered 2017-01-28 – 2017-01-29 (×2): via INTRAVENOUS

## 2017-01-28 MED ORDER — HYDROMORPHONE HCL 1 MG/ML IJ SOLN
0.2500 mg | INTRAMUSCULAR | Status: DC | PRN
  Administered 2017-01-28 (×4): 0.5 mg via INTRAVENOUS

## 2017-01-28 MED ORDER — SUGAMMADEX SODIUM 200 MG/2ML IV SOLN
INTRAVENOUS | Status: DC | PRN
  Administered 2017-01-28: 200 mg via INTRAVENOUS

## 2017-01-28 MED ORDER — FENTANYL CITRATE (PF) 100 MCG/2ML IJ SOLN
INTRAMUSCULAR | Status: AC
  Filled 2017-01-28: qty 2

## 2017-01-28 MED ORDER — CHLORHEXIDINE GLUCONATE 4 % EX LIQD: 60.0000 mL | Freq: Once | CUTANEOUS | Status: DC

## 2017-01-28 MED ORDER — SODIUM CHLORIDE 0.9 % IV SOLN
INTRAVENOUS | Status: DC | PRN
  Administered 2017-01-28: 500 mL

## 2017-01-28 MED ORDER — ACETAMINOPHEN 650 MG RE SUPP: 650.0000 mg | RECTAL | Status: DC | PRN

## 2017-01-28 MED ORDER — ACETAMINOPHEN 10 MG/ML IV SOLN
1000.0000 mg | Freq: Once | INTRAVENOUS | Status: DC | PRN
  Administered 2017-01-28: 1000 mg via INTRAVENOUS

## 2017-01-28 MED ORDER — PROMETHAZINE HCL 25 MG/ML IJ SOLN: 6.2500 mg | INTRAMUSCULAR | Status: DC | PRN

## 2017-01-28 MED ORDER — ONDANSETRON HCL 4 MG PO TABS: 4.0000 mg | ORAL_TABLET | Freq: Four times a day (QID) | ORAL | Status: DC | PRN

## 2017-01-28 MED ORDER — SUGAMMADEX SODIUM 200 MG/2ML IV SOLN
INTRAVENOUS | Status: AC
  Filled 2017-01-28: qty 2

## 2017-01-28 MED ORDER — FLEET ENEMA 7-19 GM/118ML RE ENEM: 1.0000 | ENEMA | Freq: Once | RECTAL | Status: DC | PRN

## 2017-01-28 MED ORDER — MENTHOL 3 MG MT LOZG: 1.0000 | LOZENGE | OROMUCOSAL | Status: DC | PRN

## 2017-01-28 MED ORDER — ONDANSETRON HCL 4 MG/2ML IJ SOLN
INTRAMUSCULAR | Status: DC | PRN
  Administered 2017-01-28: 4 mg via INTRAVENOUS

## 2017-01-28 MED ORDER — ONDANSETRON HCL 4 MG/2ML IJ SOLN: 4.0000 mg | Freq: Four times a day (QID) | INTRAMUSCULAR | Status: DC | PRN

## 2017-01-28 MED ORDER — BUPIVACAINE LIPOSOME 1.3 % IJ SUSP
20.0000 mL | Freq: Once | INTRAMUSCULAR | Status: AC
  Filled 2017-01-28: qty 20

## 2017-01-28 SURGICAL SUPPLY — 55 items
ADH SKN CLS APL DERMABOND .7 (GAUZE/BANDAGES/DRESSINGS) ×1
AGENT HMST SPONGE THK3/8 (HEMOSTASIS) ×1
ANCH SUT 2 5.5 BABSR ASCP (Orthopedic Implant) ×1 IMPLANT
ANCHOR PEEK ZIP 5.5 NDL NO2 (Orthopedic Implant) ×2 IMPLANT
ATTRACTOMAT 16X20 MAGNETIC DRP (DRAPES) ×2 IMPLANT
BAG SPEC THK2 15X12 ZIP CLS (MISCELLANEOUS) ×1
BAG ZIPLOCK 12X15 (MISCELLANEOUS) ×2 IMPLANT
BLADE OSCILLATING/SAGITTAL (BLADE) ×2
BLADE SW THK.38XMED LNG THN (BLADE) ×1 IMPLANT
BNDG COHESIVE 6X5 TAN STRL LF (GAUZE/BANDAGES/DRESSINGS) ×2 IMPLANT
BUR OVAL CARBIDE 4.0 (BURR) ×2 IMPLANT
COVER SURGICAL LIGHT HANDLE (MISCELLANEOUS) ×2 IMPLANT
DERMABOND ADVANCED (GAUZE/BANDAGES/DRESSINGS) ×1
DERMABOND ADVANCED .7 DNX12 (GAUZE/BANDAGES/DRESSINGS) ×1 IMPLANT
DERMASPAN .5-.9MM 4X4CM SHOU (Miscellaneous) ×2 IMPLANT
DRAPE POUCH INSTRU U-SHP 10X18 (DRAPES) ×2 IMPLANT
DRSG AQUACEL AG ADV 3.5X 6 (GAUZE/BANDAGES/DRESSINGS) ×2 IMPLANT
DURAPREP 26ML APPLICATOR (WOUND CARE) ×2 IMPLANT
ELECT BLADE TIP CTD 4 INCH (ELECTRODE) ×2 IMPLANT
ELECT REM PT RETURN 15FT ADLT (MISCELLANEOUS) ×2 IMPLANT
GLOVE BIOGEL PI IND STRL 6.5 (GLOVE) ×1 IMPLANT
GLOVE BIOGEL PI IND STRL 7.5 (GLOVE) ×2 IMPLANT
GLOVE BIOGEL PI IND STRL 8.5 (GLOVE) ×1 IMPLANT
GLOVE BIOGEL PI INDICATOR 6.5 (GLOVE) ×1
GLOVE BIOGEL PI INDICATOR 7.5 (GLOVE) ×2
GLOVE BIOGEL PI INDICATOR 8.5 (GLOVE) ×1
GLOVE ECLIPSE 8.0 STRL XLNG CF (GLOVE) ×2 IMPLANT
GLOVE SURG SS PI 6.5 STRL IVOR (GLOVE) ×2 IMPLANT
GLOVE SURG SS PI 7.0 STRL IVOR (GLOVE) ×2 IMPLANT
GOWN STRL REUS W/ TWL XL LVL3 (GOWN DISPOSABLE) ×1 IMPLANT
GOWN STRL REUS W/TWL LRG LVL3 (GOWN DISPOSABLE) ×2 IMPLANT
GOWN STRL REUS W/TWL XL LVL3 (GOWN DISPOSABLE) ×4 IMPLANT
HEMOSTAT SPONGE AVITENE ULTRA (HEMOSTASIS) ×2 IMPLANT
KIT BASIN OR (CUSTOM PROCEDURE TRAY) ×2 IMPLANT
KIT POSITION SHOULDER SCHLEI (MISCELLANEOUS) ×2 IMPLANT
MANIFOLD NEPTUNE II (INSTRUMENTS) ×2 IMPLANT
NEEDLE MA TROC 1/2 (NEEDLE) IMPLANT
PACK SHOULDER (CUSTOM PROCEDURE TRAY) ×2 IMPLANT
POSITIONER SURGICAL ARM (MISCELLANEOUS) ×2 IMPLANT
SLING ARM IMMOBILIZER LRG (SOFTGOODS) ×2 IMPLANT
SPONGE LAP 4X18 X RAY DECT (DISPOSABLE) IMPLANT
STAPLER VISISTAT 35W (STAPLE) IMPLANT
STRIP CLOSURE SKIN 1/2X4 (GAUZE/BANDAGES/DRESSINGS) ×2 IMPLANT
SUCTION FRAZIER HANDLE 12FR (TUBING) ×1
SUCTION TUBE FRAZIER 12FR DISP (TUBING) ×1 IMPLANT
SUT BONE WAX W31G (SUTURE) ×2 IMPLANT
SUT ETHIBOND NAB CT1 #1 30IN (SUTURE) ×4 IMPLANT
SUT MNCRL AB 4-0 PS2 18 (SUTURE) ×2 IMPLANT
SUT VIC AB 0 CT1 27 (SUTURE)
SUT VIC AB 0 CT1 27XBRD ANTBC (SUTURE) IMPLANT
SUT VIC AB 1 CT1 27 (SUTURE) ×4
SUT VIC AB 1 CT1 27XBRD ANTBC (SUTURE) ×2 IMPLANT
SUT VIC AB 2-0 CT1 27 (SUTURE) ×4
SUT VIC AB 2-0 CT1 TAPERPNT 27 (SUTURE) ×2 IMPLANT
TOWEL OR 17X26 10 PK STRL BLUE (TOWEL DISPOSABLE) ×2 IMPLANT

## 2017-01-28 NOTE — Discharge Instructions (Signed)
Keep your sling on at all times, including sleeping in your sling. The only time you should remove your sling is to shower only but you need to keep your hand against your chest while you shower.  The bandage is waterproof. You can shower with the bandage on. If the bandage becomes compromised, replace with gauze and paper tape. Call Dr. Gladstone Lighter if any wound complications or temperature of 101 degrees F or over.  Call the office for an appointment to see Dr. Gladstone Lighter in two weeks: [phone] and ask for Dr. Charlestine Night nurse, Brunilda Payor.

## 2017-01-28 NOTE — H&P (Signed)
[name] is an 81 y.o. male.   Chief Complaint: Pain in Left Shoulder HPI: Progressive pain and limited motion of his Left Shoulder  Past Medical History:  Diagnosis Date  . AICD (automatic cardioverter/defibrillator) present   . Anemia, unspecified   . Arthritis   . BPH with obstruction/lower urinary tract symptoms    Dr. Gaynelle Arabian.  [name] CAD (coronary artery disease)    a. MI/CABG 1996. b. VF arrest 2008: cath with severe native 3v disease, 3/3 grafts widely patent, mild to moderate left ventricular systolic dysfunction secondary to anteroapical scar. EF 40-45%.  . Cardiac arrest [facility]) 2008   2008: aborted cardiac arrest 2008 (c/b encephalopathy, VDRF, aspiriation PNA, CHF, elevated LFTs), s/p St. Jude ICD placement.  . Chronic left ventricular systolic dysfunction    Ischemic CM  . Chronic renal insufficiency, stage 3 (moderate) (HCC)    GFR 40s-50s  . Chronic venous insufficiency   . Diabetes mellitus without complication (HCC)    diet  controlled  . Disorder of bone and cartilage, unspecified   . Erectile dysfunction       "           "      "              "                  "    . Essential hypertension   . GERD (gastroesophageal reflux disease)   . Hx of adenomatous colonic polyps 2003, 2013   Pt declines any further colonoscopies as of 12/2016.  [name] Hypercholesterolemia   . Hypogonadism male    managed by [facility] (Dr. Gaynelle Arabian)  . Ischemic cardiomyopathy    a. EF 40-45% by cath 2008.  EF 39% by nuclear study 2016  . Meniscal injury Jan/Feb 2016   Sustained shoveling snow; repaired by Dr. Gladstone Lighter   . Myocardial infarction ([place])   . Obesity   . OSA on CPAP    sleep study 2008, severe OSA: CPAP 11 cm H2O  . Supraspinatus tendon tear 12/03/2016   Left shoulder  . Urinary tract infection    diagnosed on 11/13/2015     Past Surgical History:  Procedure Laterality Date  . CARDIAC DEFIBRILLATOR PLACEMENT    . CARDIOVASCULAR STRESS TEST  04/2014    High risk study: large scar, EF 39%, +WM abnl  . COLONOSCOPY  06/10/11   One diminutive polyp removed (tubular adenoma, no high grade dysplasia).  Repeat colonoscopy 5 yrs (Dr. Ardis Hughs).  . COLONOSCOPY W/ POLYPECTOMY  2003   Adenomatous; repeat 2006 was recommended but this was not done until 06/10/2011 (by different GI MD).  Pt declines any further colonoscopies as of 12/2016.  [name] CORONARY ARTERY BYPASS GRAFT  1996   3 vessel  . EP IMPLANTABLE DEVICE N/A 04/27/2015   Procedure: ICD Generator Changeout;  Surgeon: Deboraha Sprang, MD;  Location: [place] [facility];  Service: Cardiovascular;  Laterality: N/A;  . KNEE CARTILAGE SURGERY  04/2014   Dr. Gladstone Lighter: torn meniscus repair  . PACEMAKER PLACEMENT  07/2006   St Jude current VRRF--generator change 03/2015  . POLYPECTOMY    . right carpal tunnel release     . TOTAL KNEE ARTHROPLASTY Left 11/21/2015   Procedure: LEFT TOTAL KNEE ARTHROPLASTY;  Surgeon: Latanya Maudlin, MD;  Location: [facility];  Service: Orthopedics;  Laterality: Left;    Family History  Problem Relation Age of Onset  . Heart disease Mother   .  Stroke Mother   . Heart disease Father   . Colon cancer Neg Hx   . Esophageal cancer Neg Hx   . Stomach cancer Neg Hx   . Rectal cancer Neg Hx    Social History:  reports that he quit smoking about 46 years ago. His smoking use included cigarettes. he has never used smokeless tobacco. He reports that he drinks about 4.2 oz of alcohol per week. He reports that he does not use drugs.  Allergies:  Allergies  Allergen Reactions  . Naproxen Sodium Nausea Only    Medications Prior to Admission  Medication Sig Dispense Refill  . acetaminophen (TYLENOL) 650 MG CR tablet Take 650 mg by mouth every 8 (eight) hours as needed for pain.    [name] aspirin EC 81 MG tablet Take 81 mg by mouth daily.    [name] atorvastatin (LIPITOR) 20 MG tablet TAKE ONE TABLET BY MOUTH DAILY (Patient taking differently: TAKE 20 MG BY MOUTH DAILY) 90 tablet 1  . b complex  vitamins tablet Take 1 tablet by mouth daily.    [name] CALCIUM PO Take 1 tablet by mouth daily.    [name] ibuprofen (ADVIL,MOTRIN) 200 MG tablet Take 200 mg by mouth every 6 (six) hours as needed for headache or moderate pain.     [name] lisinopril (PRINIVIL,ZESTRIL) 5 MG tablet TAKE ONE TABLET BY MOUTH DAILY (Patient taking differently: TAKE 5 MG BY MOUTH DAILY) 90 tablet 1  . Menthol, Topical Analgesic, (BIOFREEZE EX) Apply 1 application topically daily as needed (for pain).     . metoprolol tartrate (LOPRESSOR) 25 MG tablet Take 1 tablet (25 mg total) by mouth 2 (two) times daily. 180 tablet 1  . Multiple Vitamin (MULTIVITAMIN) tablet Take 1 tablet by mouth daily.    . NON FORMULARY Take 1 tablet by mouth daily. OPC 3.  antioxidant protection    . pantoprazole (PROTONIX) 40 MG tablet Take 1 tablet (40 mg total) by mouth daily. 90 tablet 3  . sildenafil (REVATIO) 20 MG tablet Take 20 mg by mouth as needed (for ED).     [name] testosterone cypionate (DEPOTESTOTERONE CYPIONATE) 100 MG/ML injection Inject 100 mg into the muscle every 14 (fourteen) days. For IM use only     . zolpidem (AMBIEN) 5 MG tablet Take 5 mg by mouth at bedtime as needed for sleep.    [name] Zoster Vac Recomb Adjuvanted [facility]) injection Repeat in 2 to 6 months (Patient not taking: Reported on 01/16/2017) 0.5 mL 1    Results for orders placed or performed during the hospital encounter of 01/28/17 (from the past 48 hour(s))  Glucose, capillary     Status: Abnormal   Collection Time: 01/28/17 11:34 AM  Result Value Ref Range   Glucose-Capillary 128 (H) 65 - 99 mg/dL   No results found.  Review of Systems  Constitutional: Negative.   HENT: Negative.   Eyes: Negative.   Respiratory: Negative.   Cardiovascular: Negative.   Gastrointestinal: Negative.   Genitourinary: Negative.   Musculoskeletal: Positive for joint pain.  Skin: Negative.   Neurological: Negative.   Endo/Heme/Allergies: Negative.   Psychiatric/Behavioral: Negative.      Blood pressure 138/79, pulse 69, temperature (!) 97.5 F (36.4 C), temperature source Oral, resp. rate 18, height 6' (1.829 m), weight 103.4 kg (228 lb), SpO2 96 %. Physical Exam  Constitutional: He appears well-developed.  Eyes: Pupils are equal, round, and reactive to light.  Neck: Normal range of motion.  Cardiovascular:  Defibrillator on the right.  Respiratory: Effort normal.  GI: Soft.  Musculoskeletal: He exhibits tenderness.  Painful,limited motion of his Left Shoulder  Neurological: He is alert.  Skin: Skin is warm.  Psychiatric: He has a normal mood and affect.     Assessment/Plan Open Acromionectomy and Repair of his torn Rotator Cuff.  Latanya Maudlin, MD 01/28/2017, 12:58 PM

## 2017-01-28 NOTE — Transfer of Care (Signed)
Immediate Anesthesia Transfer of Care Note  Patient: [name]  Procedure(s) Performed: Left shoulder open rotator cuff repair with graft and anchor (Left Shoulder)  Patient Location: PACU  Anesthesia Type:General  Level of Consciousness: sedated  Airway & Oxygen Therapy: Patient Spontanous Breathing and Patient connected to face mask oxygen  Post-op Assessment: Report given to RN and Post -op Vital signs reviewed and stable  Post vital signs: Reviewed and stable  Last Vitals:  Vitals:   01/28/17 1338 01/28/17 1340  BP:  117/70  Pulse:    Resp: 11 10  Temp:    SpO2:  98%    Last Pain:  Vitals:   01/28/17 1202  TempSrc:   PainSc: 2       Patients Stated Pain Goal: 4 (78/71/83 6725)  Complications: No apparent anesthesia complications

## 2017-01-28 NOTE — Anesthesia Procedure Notes (Deleted)
Anesthesia Regional Block: Interscalene brachial plexus block   Pre-Anesthetic Checklist: ,, timeout performed, Correct Patient, Correct Site, Correct Laterality, Correct Procedure, Correct Position, site marked, Risks and benefits discussed,  Surgical consent,  Pre-op evaluation,  At surgeon's request and post-op pain management  Laterality: Left and Upper  Prep: chloraprep       Needles:  Injection technique: Single-shot  Needle Type: Echogenic Stimulator Needle     Needle Length: 9cm  Needle Gauge: 21   Needle insertion depth: 3 cm   Additional Needles:   Procedures:,,,, ultrasound used (permanent image in chart),,,,  Narrative:  Start time: 01/28/2017 2:22 PM End time: 01/28/2017 2:32 PM Injection made incrementally with aspirations every 5 mL.  Performed by: Personally  Anesthesiologist: Lyn Hollingshead, MD

## 2017-01-28 NOTE — Anesthesia Postprocedure Evaluation (Signed)
Anesthesia Post Note  Patient: [name]  Procedure(s) Performed: Left shoulder open rotator cuff repair with graft and anchor (Left Shoulder)     Patient location during evaluation: PACU Anesthesia Type: General Level of consciousness: awake and sedated Pain management: pain level controlled Vital Signs Assessment: post-procedure vital signs reviewed and stable Respiratory status: spontaneous breathing Cardiovascular status: stable Postop Assessment: no apparent nausea or vomiting Anesthetic complications: no    Last Vitals:  Vitals:   01/28/17 1600 01/28/17 1633  BP:  138/65  Pulse:  83  Resp:  15  Temp: (!) 36.4 C 36.7 C  SpO2:  99%    Last Pain:  Vitals:   01/28/17 1600  TempSrc:   PainSc: 4    Pain Goal: Patients Stated Pain Goal: 4 (01/28/17 1202)               [place]

## 2017-01-28 NOTE — Anesthesia Preprocedure Evaluation (Signed)
Anesthesia Evaluation  Patient identified by MRN, date of birth, ID band Patient awake    Reviewed: Allergy & Precautions, NPO status , Patient's Chart, lab work & pertinent test results  Airway Mallampati: I       Dental no notable dental hx. (+) Teeth Intact   Pulmonary former smoker,    Pulmonary exam normal        Cardiovascular hypertension, Normal cardiovascular exam Rhythm:Regular Rate:Normal     Neuro/Psych negative neurological ROS  negative psych ROS   GI/Hepatic   Endo/Other  diabetes  Renal/GU      Musculoskeletal   Abdominal Normal abdominal exam  (+)   Peds  Hematology   Anesthesia Other Findings   Reproductive/Obstetrics                             Anesthesia Physical Anesthesia Plan  ASA: II  Anesthesia Plan: General   Post-op Pain Management:    Induction: Intravenous  PONV Risk Score and Plan: 2  Airway Management Planned: Oral ETT  Additional Equipment:   Intra-op Plan:   Post-operative Plan: Extubation in OR  Informed Consent: I have reviewed the patients History and Physical, chart, labs and discussed the procedure including the risks, benefits and alternatives for the proposed anesthesia with the patient or authorized representative who has indicated his/her understanding and acceptance.   Dental advisory given  Plan Discussed with: CRNA  Anesthesia Plan Comments:         Anesthesia Quick Evaluation

## 2017-01-28 NOTE — Progress Notes (Signed)
AssistedDr. Hatchett with left, ultrasound guided, interscalene  block. Side rails up, monitors on throughout procedure. See vital signs in flow sheet. Tolerated Procedure well.  

## 2017-01-28 NOTE — Brief Op Note (Signed)
01/28/2017  2:51 PM  PATIENT:  [name]  81 y.o. male  PRE-OPERATIVE DIAGNOSIS:  Left shoulder rotator cuff tear,Complete,Complex and Retracted.  POST-OPERATIVE DIAGNOSIS: Same as Pre-Op  PROCEDURE:  Procedure(s) with comments: Left shoulder open rotator cuff repair with graft and anchor (Left) - Interscalene Block  SURGEON:  Surgeon(s) and Role:    Latanya Maudlin, MD - Primary  PHYSICIAN ASSISTANT: Ardeen Jourdain PA  ASSISTANTS:Amber Bruno PA  ANESTHESIA:   general  EBL:  30cc   BLOOD ADMINISTERED:none  DRAINS: none   LOCAL MEDICATIONS USED:  OTHER Interscalene Nerve Block by Anesthesia on the Left  SPECIMEN:  No Specimen  DISPOSITION OF SPECIMEN:  N/A  COUNTS:  YES  TOURNIQUET:  * No tourniquets in log *  DICTATION: .Other Dictation: Dictation Number [id number]  PLAN OF CARE: Admit to inpatient   PATIENT DISPOSITION:  Stable in OR   Delay start of Pharmacological VTE agent (>24hrs) due to surgical blood loss or risk of bleeding: yes

## 2017-01-28 NOTE — Anesthesia Procedure Notes (Addendum)
Anesthesia Regional Block: Interscalene brachial plexus block   Pre-Anesthetic Checklist: ,, timeout performed, Correct Patient, Correct Site, Correct Laterality, Correct Procedure, Correct Position, site marked, Risks and benefits discussed,  Surgical consent,  Pre-op evaluation,  At surgeon's request and post-op pain management  Laterality: Left and Upper  Prep: chloraprep       Needles:  Injection technique: Single-shot  Needle Type: Echogenic Stimulator Needle     Needle Length: 9cm  Needle Gauge: 21   Needle insertion depth: 3 cm   Additional Needles:   Procedures:,,,, ultrasound used (permanent image in chart),,,,  Narrative:  Start time: 01/28/2017 1:26 PM End time: 01/28/2017 1:36 PM Injection made incrementally with aspirations every 5 mL.  Performed by: Personally  Anesthesiologist: Lyn Hollingshead, MD

## 2017-01-28 NOTE — Anesthesia Procedure Notes (Signed)
Procedures

## 2017-01-28 NOTE — Interval H&P Note (Signed)
History and Physical Interval Note:  01/28/2017 1:06 PM  [name]  has presented today for surgery, with the diagnosis of Left shoulder rotator cuff tear  The various methods of treatment have been discussed with the patient and family. After consideration of risks, benefits and other options for treatment, the patient has consented to  Procedure(s): Left shoulder open rotator cuff repair with possible graft and anchors (Left) as a surgical intervention .  The patient's history has been reviewed, patient examined, no change in status, stable for surgery.  I have reviewed the patient's chart and labs.  Questions were answered to the patient's satisfaction.     Latanya Maudlin

## 2017-01-28 NOTE — Anesthesia Procedure Notes (Signed)
Procedure Name: Intubation Date/Time: 01/28/2017 1:57 PM Performed by: Lind Covert, CRNA Pre-anesthesia Checklist: Patient identified, Emergency Drugs available, Suction available, Patient being monitored and Timeout performed Patient Re-evaluated:Patient Re-evaluated prior to induction Oxygen Delivery Method: Circle system utilized Preoxygenation: Pre-oxygenation with 100% oxygen Induction Type: IV induction Ventilation: Mask ventilation without difficulty Laryngoscope Size: Mac and 4 Tube type: Oral Tube size: 7.5 mm Number of attempts: 1 Airway Equipment and Method: Stylet Placement Confirmation: ETT inserted through vocal cords under direct vision,  positive ETCO2 and breath sounds checked- equal and bilateral Secured at: 23 cm Tube secured with: Tape Dental Injury: Teeth and Oropharynx as per pre-operative assessment

## 2017-01-29 ENCOUNTER — Encounter (HOSPITAL_COMMUNITY): Payer: Self-pay | Admitting: Orthopedic Surgery

## 2017-01-29 DIAGNOSIS — I255 Ischemic cardiomyopathy: Secondary | ICD-10-CM | POA: Diagnosis not present

## 2017-01-29 DIAGNOSIS — I2510 Atherosclerotic heart disease of native coronary artery without angina pectoris: Secondary | ICD-10-CM | POA: Diagnosis not present

## 2017-01-29 DIAGNOSIS — M75122 Complete rotator cuff tear or rupture of left shoulder, not specified as traumatic: Secondary | ICD-10-CM | POA: Diagnosis not present

## 2017-01-29 DIAGNOSIS — M7542 Impingement syndrome of left shoulder: Secondary | ICD-10-CM | POA: Diagnosis not present

## 2017-01-29 DIAGNOSIS — I129 Hypertensive chronic kidney disease with stage 1 through stage 4 chronic kidney disease, or unspecified chronic kidney disease: Secondary | ICD-10-CM | POA: Diagnosis not present

## 2017-01-29 DIAGNOSIS — I252 Old myocardial infarction: Secondary | ICD-10-CM | POA: Diagnosis not present

## 2017-01-29 NOTE — Progress Notes (Signed)
Subjective: 1 Day Post-Op Procedure(s) (LRB): Left shoulder open rotator cuff repair with graft and anchor (Left) Patient reports pain as 1 on 0-10 scale. Doing very well this morning .Will DC after PT  Objective: Vital signs in last 24 hours: Temp:  [97.4 F (36.3 C)-98.2 F (36.8 C)] 98.2 F (36.8 C) (12/13 0635) Pulse Rate:  [69-91] 84 (12/13 0635) Resp:  [9-22] 15 (12/13 0635) BP: (101-139)/(56-79) 109/58 (12/13 0635) SpO2:  [94 %-100 %] 99 % (12/13 0635) Weight:  [103.4 kg (228 lb)] 103.4 kg (228 lb) (12/12 1202)  Intake/Output from previous day: 12/12 0701 - 12/13 0700 In: 3481.7 [P.O.:1140; I.V.:2141.7; IV Piggyback:200] Out: 1300 [Urine:1300] Intake/Output this shift: No intake/output data recorded.  Recent Labs    01/26/17 1120  HGB 13.0   Recent Labs    01/26/17 1120  WBC 6.7  RBC 4.12*  HCT 38.0*  PLT 222   Recent Labs    01/26/17 1105  NA 139  K 4.5  CL 107  CO2 26  BUN 22*  CREATININE 1.36*  GLUCOSE 104*  CALCIUM 9.5   Recent Labs    01/26/17 1105  INR 0.97    Neurovascular intact  Assessment/Plan: 1 Day Post-Op Procedure(s) (LRB): Left shoulder open rotator cuff repair with graft and anchor (Left) DC today  Latanya Maudlin 01/29/2017, 7:24 AM

## 2017-01-29 NOTE — Progress Notes (Signed)
Patient discharged to home with family. Given all belongings, instructions, prescriptions, equipment. Family present for all teaching. Both verbalized understanding of instructions. Escorted to POV via w/c.

## 2017-01-29 NOTE — Evaluation (Signed)
Occupational Therapy Evaluation Patient Details Name: [name] MRN: [id number] DOB: 03-23-1935 Today's Date: 01/29/2017    History of Present Illness s/p L RCR with graft and anchor.  PMH:  cardiac arrest, ischemic cardiomyopathy, DM, TKA   Clinical Impression   This 81 year old man was admitted for the above sx.  He will benefit from one more session of OT to further educate wife on precautions and safely assisting him during adls.      Follow Up Recommendations  DC plan and follow up therapy as arranged by surgeon    Equipment Recommendations  None recommended by OT    Recommendations for Other Services       Precautions / Restrictions Precautions Precautions: Shoulder Type of Shoulder Precautions: conservative protocol Shoulder Interventions: Shoulder sling/immobilizer Precaution Booklet Issued: Yes (comment) Restrictions Weight Bearing Restrictions: Yes Other Position/Activity Restrictions: NWB      Mobility Bed Mobility               General bed mobility comments: oob  Transfers Overall transfer level: Needs assistance   Transfers: Sit to/from Stand Sit to Stand: Supervision              Balance Overall balance assessment: No apparent balance deficits (not formally assessed)                                         ADL either performed or assessed with clinical judgement   ADL Overall ADL's : Needs assistance/impaired Eating/Feeding: Set up;Sitting   Grooming: Set up;Sitting   Upper Body Bathing: Moderate assistance;Sitting   Lower Body Bathing: Moderate assistance;Sit to/from stand   Upper Body Dressing : Maximal assistance;Sitting   Lower Body Dressing: Maximal assistance;Sit to/from stand                 General ADL Comments: pt was up in chair. Only performed sit to stand.  Educated on shoulder protocol, adjusted sling and performed LB dressing as well as used urinal.  Pt.   verbalizes  understanding of education. See education section of chart.  Will don shirt and sling when wife arrives     Vision         Perception     Praxis      Pertinent Vitals/Pain Pain Assessment: No/denies pain     Hand Dominance Right   Extremity/Trunk Assessment Upper Extremity Assessment Upper Extremity Assessment: LUE deficits/detail(immobilized; wrist and fingers WFLs)           Communication Communication Communication: No difficulties   Cognition Arousal/Alertness: Awake/alert Behavior During Therapy: WFL for tasks assessed/performed Overall Cognitive Status: Within Functional Limits for tasks assessed                                     General Comments       Exercises     Shoulder Instructions      Home Living Family/patient expects to be discharged to:: Private residence Living Arrangements: Spouse/significant other                 Bathroom Shower/Tub: Occupational psychologist: Handicapped height                Prior Functioning/Environment Level of Independence: Independent  OT Problem List: Decreased knowledge of precautions      OT Treatment/Interventions: Self-care/ADL training;DME and/or AE instruction;Patient/family education    OT Goals(Current goals can be found in the care plan section) Acute Rehab OT Goals Patient Stated Goal: return to independence  OT Goal Formulation: With patient Time For Goal Achievement: 01/30/17 Potential to Achieve Goals: Good ADL Goals Additional ADL Goal #1: wife will verbalize or demonstrate understanding of shoulder precautions and demonstrated application of sling with supervision  OT Frequency: Min 2X/week   Barriers to D/C:            Co-evaluation              AM-PAC PT "6 Clicks" Daily Activity     Outcome Measure Help from another person eating meals?: None Help from another person taking care of personal grooming?: A Little Help  from another person toileting, which includes using toliet, bedpan, or urinal?: A Little Help from another person bathing (including washing, rinsing, drying)?: A Lot Help from another person to put on and taking off regular upper body clothing?: A Lot Help from another person to put on and taking off regular lower body clothing?: A Lot 6 Click Score: 16   End of Session    Activity Tolerance: Patient tolerated treatment well Patient left: in chair;with call bell/phone within reach  OT Visit Diagnosis: Muscle weakness (generalized) (M62.81)                Time: 2671-2458 OT Time Calculation (min): 23 min Charges:  OT General Charges $OT Visit: 1 Visit OT Evaluation $OT Eval Low Complexity: 1 Low G-Codes: OT G-codes **NOT FOR INPATIENT CLASS** Functional Assessment Tool Used: Clinical judgement Functional Limitation: Self care Self Care Current Status (K9983): At least 60 percent but less than 80 percent impaired, limited or restricted Self Care Goal Status (J8250): At least 60 percent but less than 80 percent impaired, limited or restricted   [name], OTR/L [phone] 01/29/2017  SPENCER,MARYELLEN 01/29/2017, 8:26 AM

## 2017-01-29 NOTE — Progress Notes (Signed)
   01/29/17 1100  OT Visit Information  Last OT Received On 01/29/17  Assistance Needed +1  History of Present Illness s/p L RCR with graft and anchor.  PMH:  cardiac arrest, ischemic cardiomyopathy, DM, TKA  Precautions  Precautions Shoulder  Type of Shoulder Precautions conservative protocol  Shoulder Interventions Shoulder sling/immobilizer  Precaution Booklet Issued Yes (comment)  Pain Assessment  Pain Assessment 0-10  Pain Score 3  Pain Location L shoulder  Pain Descriptors / Indicators Aching  Pain Intervention(s) Limited activity within patient's tolerance;Monitored during session;Premedicated before session;Repositioned;Ice applied  Cognition  Arousal/Alertness Awake/alert  Behavior During Therapy WFL for tasks assessed/performed  Overall Cognitive Status Within Functional Limits for tasks assessed  ADL  General ADL Comments donned shirt and reviewed protocol with wife. She verbalizes understanding. Wife donned sling with supervision  Bed Mobility  General bed mobility comments oob  Restrictions  Other Position/Activity Restrictions NWB  Transfers  Sit to Stand Supervision  OT - End of Session  Activity Tolerance Patient tolerated treatment well  Patient left in chair;with call bell/phone within reach  OT Assessment/Plan  Follow Up Recommendations DC plan and follow up therapy as arranged by surgeon  OT Equipment None recommended by OT  AM-PAC OT "6 Clicks" Daily Activity Outcome Measure  Help from another person eating meals? 4  Help from another person taking care of personal grooming? 3  Help from another person toileting, which includes using toliet, bedpan, or urinal? 3  Help from another person bathing (including washing, rinsing, drying)? 2  Help from another person to put on and taking off regular upper body clothing? 2  Help from another person to put on and taking off regular lower body clothing? 2  6 Click Score 16  ADL G Code Conversion CK  OT Goal  Progression  Progress towards OT goals Goals met/education completed, patient discharged from OT  OT Time Calculation  OT Start Time (ACUTE ONLY) 1134  OT Stop Time (ACUTE ONLY) 1154  OT Time Calculation (min) 20 min  OT G-codes **NOT FOR INPATIENT CLASS**  Self Care Discharge Status (X4801) CL  OT General Charges  $OT Visit 1 Visit  OT Treatments  $Self Care/Home Management  8-22 mins  [name], OTR/L [phone] 01/29/2017

## 2017-01-29 NOTE — Op Note (Signed)
NAMECARMAN, ESSICK NO.:  [id number]  MEDICAL RECORD NO.:  [id number]  LOCATION:                                 FACILITY:  PHYSICIAN:  Kipp Brood. Gioffre, M.D.DATE OF BIRTH:  05/12/1935  DATE OF PROCEDURE:  01/28/2017 DATE OF DISCHARGE:                              OPERATIVE REPORT   SURGEON:  Kipp Brood. Gladstone Lighter, M.D.  OPERATIVE ASSISTANT:  Ardeen Jourdain, PA.  PREOPERATIVE DIAGNOSES: 1. Severe impingement syndrome, left shoulder. 2. Complete complex retracted tear of the left rotator cuff.  POSTOPERATIVE DIAGNOSES: 1. Severe impingement syndrome, left shoulder. 2. Complete complex retracted tear of the left rotator cuff.  OPERATION: 1. Open acromionectomy and acromioplasty, left shoulder. 2. Repair of a complete retracted complex tear of the left rotator     cuff utilizing 1 anchor. 3. DermaSpan graft to the rotator cuff defect.  DESCRIPTION OF PROCEDURE:  Under general anesthesia, the patient in the beach chair position, a routine orthopedic prep and draping of the left upper extremity was carried out.  He had 2 g of IV Ancef.  The appropriate time-out was carried out.  I also marked the appropriate left arm in the holding area.  An incision then was made over the anterior aspect of the left shoulder.  Bleeders were identified and cauterized.  I split a small proximal part of the deltoid muscle after I stripped the deltoid tendon from the acromion.  His acromion was severely thickened and downsloping.  We protected the under side of the acromion with the Bennett retractor and did a partial acromionectomy with the oscillating saw and acromioplasty with a bur.  Thoroughly, irrigated out the area and then bone waxed the undersurface of the acromion.  Following that, I burred the lateral articular surface of the humerus to see some bleeding bone.  We then utilized a Kocher clamp.  We freed up the retracted rotator cuff, brought it forward first,  sutured it to itself proximally.  We then sutured rest of the tendon back down to the bone with an anchor in place.  After the repair was completed, we then applied a DermaSpan graft to the repair site.  We thoroughly irrigated out the area and reapproximated deltoid tendon and muscle in usual fashion.  Subcu was closed with a 2-0 Vicryl and then, a running subcu suture was utilized.  Sterile dressings were applied, and he was placed in a shoulder immobilizer.  He had an interscalene nerve block preop.  He will be kept overnight for pain control.          ______________________________ Kipp Brood. Gladstone Lighter, M.D.     RAG/MEDQ  D:  01/28/2017  T:  01/29/2017  Job:  [id number]

## 2017-01-29 NOTE — Care Management Obs Status (Signed)
[facility] NOTIFICATION   Patient Details  Name: Arien Benincasa MRN: [id number] Date of Birth: 02/10/1936   Medicare Observation Status Notification Given:  Yes    Guadalupe Maple, RN 01/29/2017, 11:11 AM

## 2017-01-30 ENCOUNTER — Encounter: Payer: Self-pay | Admitting: Cardiology

## 2017-01-30 LAB — CUP PACEART REMOTE DEVICE CHECK
Battery Remaining Longevity: 85 mo
Battery Remaining Percentage: 83 %
Battery Voltage: 3.01 V
Brady Statistic RV Percent Paced: 1 %
Date Time Interrogation Session: 20181206070015
HighPow Impedance: 47 Ohm
HighPow Impedance: 47 Ohm
Implantable Lead Implant Date: 20080624
Implantable Lead Location: 753860
Implantable Lead Model: 7121
Implantable Pulse Generator Implant Date: 20170310
Lead Channel Impedance Value: 430 Ohm
Lead Channel Pacing Threshold Amplitude: 0.75 V
Lead Channel Pacing Threshold Pulse Width: 0.5 ms
Lead Channel Sensing Intrinsic Amplitude: 11.3 mV
Lead Channel Setting Pacing Amplitude: 2.5 V
Lead Channel Setting Pacing Pulse Width: 0.5 ms
Lead Channel Setting Sensing Sensitivity: 0.5 mV
Pulse Gen Serial Number: 7310877

## 2017-01-30 NOTE — Discharge Summary (Signed)
Physician Discharge Summary   Patient ID: [name] MRN: [id number] DOB/AGE: 1935-11-25 81 y.o.  Admit date: 01/28/2017 Discharge date: 01/29/2017  Primary Diagnosis: Left shoulder, rotator cuff tear   Admission Diagnoses:  Past Medical History:  Diagnosis Date  . AICD (automatic cardioverter/defibrillator) present   . Anemia, unspecified   . Arthritis   . BPH with obstruction/lower urinary tract symptoms    Dr. Gaynelle Arabian.  [name] CAD (coronary artery disease)    a. MI/CABG 1996. b. VF arrest 2008: cath with severe native 3v disease, 3/3 grafts widely patent, mild to moderate left ventricular systolic dysfunction secondary to anteroapical scar. EF 40-45%.  . Cardiac arrest [facility]) 2008   2008: aborted cardiac arrest 2008 (c/b encephalopathy, VDRF, aspiriation PNA, CHF, elevated LFTs), s/p St. Jude ICD placement.  . Chronic left ventricular systolic dysfunction    Ischemic CM  . Chronic renal insufficiency, stage 3 (moderate) (HCC)    GFR 40s-50s  . Chronic venous insufficiency   . Diabetes mellitus without complication (HCC)    diet  controlled  . Disorder of bone and cartilage, unspecified   . Erectile dysfunction       "           "      "              "                  "    . Essential hypertension   . GERD (gastroesophageal reflux disease)   . Hx of adenomatous colonic polyps 2003, 2013   Pt declines any further colonoscopies as of 12/2016.  [name] Hypercholesterolemia   . Hypogonadism male    managed by [facility] (Dr. Gaynelle Arabian)  . Ischemic cardiomyopathy    a. EF 40-45% by cath 2008.  EF 39% by nuclear study 2016  . Meniscal injury Jan/Feb 2016   Sustained shoveling snow; repaired by Dr. Gladstone Lighter   . Myocardial infarction ([place])   . Obesity   . OSA on CPAP    sleep study 2008, severe OSA: CPAP 11 cm H2O  . Supraspinatus tendon tear 12/03/2016   Left shoulder  . Urinary tract infection    diagnosed on 11/13/2015    Discharge Diagnoses:   Active  Problems:   Rotator cuff tear, left  Estimated body mass index is 30.92 kg/m as calculated from the following:   Height as of this encounter: 6' (1.829 m).   Weight as of this encounter: 103.4 kg (228 lb).  Procedure:  Procedure(s) (LRB): Left shoulder open rotator cuff repair with graft and anchor (Left)   Consults: None  HPI: The patient is a 81 year old male who presented with the chief complaint of left shoulder. He developed pain after a traction injury to the shoulder. MRI showed a tear of the rotator cuff. He was not responsive to conservative treatments. Dr. Gladstone Lighter and the patient elected to move forward with open rotator cuff repair.   Laboratory Data: Admission on 01/28/2017, Discharged on 01/29/2017  Component Date Value Ref Range Status  . Glucose-Capillary 01/28/2017 128* 65 - 99 mg/dL Final  Hospital Outpatient Visit on 01/26/2017  Component Date Value Ref Range Status  . aPTT 01/26/2017 27  24 - 36 seconds Final  . Sodium 01/26/2017 139  135 - 145 mmol/L Final  . Potassium 01/26/2017 4.5  3.5 - 5.1 mmol/L Final  . Chloride 01/26/2017 107  101 - 111 mmol/L Final  . CO2 01/26/2017  26  22 - 32 mmol/L Final  . Glucose, Bld 01/26/2017 104* 65 - 99 mg/dL Final  . BUN 01/26/2017 22* 6 - 20 mg/dL Final  . Creatinine, Ser 01/26/2017 1.36* 0.61 - 1.24 mg/dL Final  . Calcium 01/26/2017 9.5  8.9 - 10.3 mg/dL Final  . Total Protein 01/26/2017 7.6  6.5 - 8.1 g/dL Final  . Albumin 01/26/2017 4.2  3.5 - 5.0 g/dL Final  . AST 01/26/2017 27  15 - 41 U/L Final  . ALT 01/26/2017 30  17 - 63 U/L Final  . Alkaline Phosphatase 01/26/2017 56  38 - 126 U/L Final  . Total Bilirubin 01/26/2017 0.7  0.3 - 1.2 mg/dL Final  . GFR calc non Af Amer 01/26/2017 48* >60 mL/min Final  . GFR calc Af Amer 01/26/2017 55* >60 mL/min Final   Comment: (NOTE) The eGFR has been calculated using the CKD EPI equation. This calculation has not been validated in all clinical situations. eGFR's persistently  <60 mL/min signify possible Chronic Kidney Disease.   . Anion gap 01/26/2017 6  5 - 15 Final  . Prothrombin Time 01/26/2017 12.8  11.4 - 15.2 seconds Final  . INR 01/26/2017 0.97   Final  . WBC 01/26/2017 6.7  4.0 - 10.5 K/uL Final  . RBC 01/26/2017 4.12* 4.22 - 5.81 MIL/uL Final  . Hemoglobin 01/26/2017 13.0  13.0 - 17.0 g/dL Final  . HCT 01/26/2017 38.0* 39.0 - 52.0 % Final  . MCV 01/26/2017 92.2  78.0 - 100.0 fL Final  . MCH 01/26/2017 31.6  26.0 - 34.0 pg Final  . MCHC 01/26/2017 34.2  30.0 - 36.0 g/dL Final  . RDW 01/26/2017 13.1  11.5 - 15.5 % Final  . Platelets 01/26/2017 222  150 - 400 K/uL Final  . Neutrophils Relative % 01/26/2017 59  % Final  . Neutro Abs 01/26/2017 3.9  1.7 - 7.7 K/uL Final  . Lymphocytes Relative 01/26/2017 29  % Final  . Lymphs Abs 01/26/2017 1.9  0.7 - 4.0 K/uL Final  . Monocytes Relative 01/26/2017 9  % Final  . Monocytes Absolute 01/26/2017 0.6  0.1 - 1.0 K/uL Final  . Eosinophils Relative 01/26/2017 3  % Final  . Eosinophils Absolute 01/26/2017 0.2  0.0 - 0.7 K/uL Final  . Basophils Relative 01/26/2017 0  % Final  . Basophils Absolute 01/26/2017 0.0  0.0 - 0.1 K/uL Final  Office Visit on 12/26/2016  Component Date Value Ref Range Status  . Cholesterol 12/26/2016 111  0 - 200 mg/dL Final   ATP III Classification       Desirable:  < 200 mg/dL               Borderline High:  200 - 239 mg/dL          High:  > = 240 mg/dL  . Triglycerides 12/26/2016 76.0  0.0 - 149.0 mg/dL Final   Normal:  <150 mg/dLBorderline High:  150 - 199 mg/dL  . HDL 12/26/2016 39.90  >39.00 mg/dL Final  . VLDL 12/26/2016 15.2  0.0 - 40.0 mg/dL Final  . LDL Cholesterol 12/26/2016 56  0 - 99 mg/dL Final  . Total CHOL/HDL Ratio 12/26/2016 3   Final                  Men          Women1/2 Average Risk     3.4          3.3Average Risk  5.0          4.42X Average Risk          9.6          7.13X Average Risk          15.0          11.0                      . NonHDL 12/26/2016  71.40   Final   NOTE:  Non-HDL goal should be 30 mg/dL higher than patient's LDL goal (i.e. LDL goal of < 70 mg/dL, would have non-HDL goal of < 100 mg/dL)  . Hgb A1c MFr Bld 12/26/2016 6.7* 4.6 - 6.5 % Final   Glycemic Control Guidelines for People with Diabetes:Non Diabetic:  <6%Goal of Therapy: <7%Additional Action Suggested:  >8%   . Sodium 12/26/2016 138  135 - 145 mEq/L Final  . Potassium 12/26/2016 4.2  3.5 - 5.1 mEq/L Final  . Chloride 12/26/2016 103  96 - 112 mEq/L Final  . CO2 12/26/2016 27  19 - 32 mEq/L Final  . Glucose, Bld 12/26/2016 130* 70 - 99 mg/dL Final  . BUN 12/26/2016 18  6 - 23 mg/dL Final  . Creatinine, Ser 12/26/2016 1.35  0.40 - 1.50 mg/dL Final  . Calcium 12/26/2016 10.0  8.4 - 10.5 mg/dL Final  . GFR 12/26/2016 54.20* >60.00 mL/min Final     X-Rays:No results found.  EKG: Orders placed or performed during the hospital encounter of 01/26/17  . EKG 12 lead  . EKG 12 lead     Hospital Course: [name] is a 81 y.o. who was admitted to [facility]. They were brought to the operating room on 01/28/2017 and underwent Procedure(s): Left shoulder open rotator cuff repair with graft and anchor.  Patient tolerated the procedure well and was later transferred to the recovery room and then to the orthopaedic floor for postoperative care.  They were given PO and IV analgesics for pain control following their surgery.  They were given 24 hours of postoperative antibiotics of  Anti-infectives (From admission, onward)   Start     Dose/Rate Route Frequency Ordered Stop   01/28/17 2000  ceFAZolin (ANCEF) IVPB 1 g/50 mL premix     1 g 100 mL/hr over 30 Minutes Intravenous Every 6 hours 01/28/17 1638 01/29/17 1008   01/28/17 1416  polymyxin B 500,000 Units, bacitracin 50,000 Units in sodium chloride 0.9 % 500 mL irrigation  Status:  Discontinued       As needed 01/28/17 1420 01/28/17 1503   01/28/17 1143  ceFAZolin (ANCEF) IVPB 2g/100 mL premix     2  g 200 mL/hr over 30 Minutes Intravenous On call to O.R. 01/28/17 1143 01/28/17 1358    PT and OT were ordered. Discharge planning consulted to help with postop disposition and equipment needs.  Patient had a fair night on the evening of surgery. Aquacel dressing was in good condition and not compromised.  Patient was seen in rounds and was ready to go home.   Diet: Cardiac diet Activity:Wear sling at all times Follow-up:in 10 days Disposition - Home Discharged Condition: stable   Discharge Instructions    Call MD / Call 911   Complete by:  As directed    If you experience chest pain or shortness of breath, CALL 911 and be transported to the hospital emergency room.  If you develope a fever above 101 F, pus (white  drainage) or increased drainage or redness at the wound, or calf pain, call your surgeon's office.   Constipation Prevention   Complete by:  As directed    Drink plenty of fluids.  Prune juice may be helpful.  You may use a stool softener, such as Colace (over the counter) 100 mg twice a day.  Use MiraLax (over the counter) for constipation as needed.   Diet - low sodium heart healthy   Complete by:  As directed    Discharge instructions   Complete by:  As directed    Keep your sling on at all times, including sleeping in your sling. The only time you should remove your sling is to shower only but you need to keep your hand against your chest while you shower.  The bandage is waterproof. You can shower with the bandage on. If the bandage becomes compromised, replace with gauze and paper tape. Call Dr. Gladstone Lighter if any wound complications or temperature of 101 degrees F or over.  Call the office for an appointment to see Dr. Gladstone Lighter in two weeks: [phone] and ask for Dr. Charlestine Night nurse, Brunilda Payor.   Driving restrictions   Complete by:  As directed    No driving   Increase activity slowly as tolerated   Complete by:  As directed      Allergies as of 01/29/2017       Reactions   Naproxen Sodium Nausea Only      Medication List    STOP taking these medications   acetaminophen 650 MG CR tablet Commonly known as:  TYLENOL     TAKE these medications   aspirin EC 81 MG tablet Take 81 mg by mouth daily.   atorvastatin 20 MG tablet Commonly known as:  LIPITOR TAKE ONE TABLET BY MOUTH DAILY What changed:    how much to take  how to take this  when to take this   b complex vitamins tablet Take 1 tablet by mouth daily.   BIOFREEZE EX Apply 1 application topically daily as needed (for pain).   CALCIUM PO Take 1 tablet by mouth daily.   HYDROcodone-acetaminophen 5-325 MG tablet Commonly known as:  NORCO/VICODIN Take 1-2 tablets by mouth every 6 (six) hours as needed for moderate pain ((score 4 to 6)).   ibuprofen 200 MG tablet Commonly known as:  ADVIL,MOTRIN Take 200 mg by mouth every 6 (six) hours as needed for headache or moderate pain.   lisinopril 5 MG tablet Commonly known as:  PRINIVIL,ZESTRIL TAKE ONE TABLET BY MOUTH DAILY What changed:    how much to take  how to take this  when to take this   metoprolol tartrate 25 MG tablet Commonly known as:  LOPRESSOR Take 1 tablet (25 mg total) by mouth 2 (two) times daily.   multivitamin tablet Take 1 tablet by mouth daily.   NON FORMULARY Take 1 tablet by mouth daily. OPC 3.  antioxidant protection   pantoprazole 40 MG tablet Commonly known as:  PROTONIX Take 1 tablet (40 mg total) by mouth daily.   sildenafil 20 MG tablet Commonly known as:  REVATIO Take 20 mg by mouth as needed (for ED).   testosterone cypionate 100 MG/ML injection Commonly known as:  DEPOTESTOTERONE CYPIONATE Inject 100 mg into the muscle every 14 (fourteen) days. For IM use only   zolpidem 5 MG tablet Commonly known as:  AMBIEN Take 5 mg by mouth at bedtime as needed for sleep.   Zoster Vaccine Adjuvanted  injection Commonly known as:  SHINGRIX Repeat in 2 to 6 months      Follow-up  Information    Latanya Maudlin, MD. Schedule an appointment as soon as possible for a visit in 10 day(s).   Specialty:  Orthopedic Surgery Contact information: [address] [place] [address] [phone]           Signed: Ardeen Jourdain, PA-C Orthopaedic Surgery 01/30/2017, 11:19 AM

## 2017-02-05 DIAGNOSIS — M75102 Unspecified rotator cuff tear or rupture of left shoulder, not specified as traumatic: Secondary | ICD-10-CM | POA: Diagnosis not present

## 2017-02-05 DIAGNOSIS — Z4789 Encounter for other orthopedic aftercare: Secondary | ICD-10-CM | POA: Diagnosis not present

## 2017-02-11 ENCOUNTER — Encounter: Payer: Self-pay | Admitting: Family Medicine

## 2017-02-12 DIAGNOSIS — Z4789 Encounter for other orthopedic aftercare: Secondary | ICD-10-CM | POA: Diagnosis not present

## 2017-02-12 DIAGNOSIS — M65332 Trigger finger, left middle finger: Secondary | ICD-10-CM | POA: Diagnosis not present

## 2017-02-12 DIAGNOSIS — M75102 Unspecified rotator cuff tear or rupture of left shoulder, not specified as traumatic: Secondary | ICD-10-CM | POA: Diagnosis not present

## 2017-03-06 DIAGNOSIS — M25612 Stiffness of left shoulder, not elsewhere classified: Secondary | ICD-10-CM | POA: Diagnosis not present

## 2017-03-10 DIAGNOSIS — M25612 Stiffness of left shoulder, not elsewhere classified: Secondary | ICD-10-CM | POA: Diagnosis not present

## 2017-03-12 DIAGNOSIS — M25612 Stiffness of left shoulder, not elsewhere classified: Secondary | ICD-10-CM | POA: Diagnosis not present

## 2017-03-16 DIAGNOSIS — M25612 Stiffness of left shoulder, not elsewhere classified: Secondary | ICD-10-CM | POA: Insufficient documentation

## 2017-03-19 DIAGNOSIS — M25612 Stiffness of left shoulder, not elsewhere classified: Secondary | ICD-10-CM | POA: Diagnosis not present

## 2017-03-24 DIAGNOSIS — M25612 Stiffness of left shoulder, not elsewhere classified: Secondary | ICD-10-CM | POA: Diagnosis not present

## 2017-03-26 DIAGNOSIS — M25612 Stiffness of left shoulder, not elsewhere classified: Secondary | ICD-10-CM | POA: Diagnosis not present

## 2017-03-31 ENCOUNTER — Other Ambulatory Visit: Payer: Self-pay | Admitting: Family Medicine

## 2017-04-02 DIAGNOSIS — M25612 Stiffness of left shoulder, not elsewhere classified: Secondary | ICD-10-CM | POA: Diagnosis not present

## 2017-04-07 DIAGNOSIS — M25612 Stiffness of left shoulder, not elsewhere classified: Secondary | ICD-10-CM | POA: Diagnosis not present

## 2017-04-09 DIAGNOSIS — M25612 Stiffness of left shoulder, not elsewhere classified: Secondary | ICD-10-CM | POA: Diagnosis not present

## 2017-04-13 ENCOUNTER — Other Ambulatory Visit: Payer: Self-pay | Admitting: Family Medicine

## 2017-04-14 DIAGNOSIS — M25612 Stiffness of left shoulder, not elsewhere classified: Secondary | ICD-10-CM | POA: Diagnosis not present

## 2017-04-16 DIAGNOSIS — M25612 Stiffness of left shoulder, not elsewhere classified: Secondary | ICD-10-CM | POA: Diagnosis not present

## 2017-04-21 DIAGNOSIS — M25612 Stiffness of left shoulder, not elsewhere classified: Secondary | ICD-10-CM | POA: Diagnosis not present

## 2017-04-23 ENCOUNTER — Ambulatory Visit (INDEPENDENT_AMBULATORY_CARE_PROVIDER_SITE_OTHER): Payer: Medicare Other | Admitting: *Deleted

## 2017-04-23 DIAGNOSIS — I255 Ischemic cardiomyopathy: Secondary | ICD-10-CM | POA: Diagnosis not present

## 2017-04-23 DIAGNOSIS — M25612 Stiffness of left shoulder, not elsewhere classified: Secondary | ICD-10-CM | POA: Diagnosis not present

## 2017-04-23 NOTE — Progress Notes (Signed)
Remote ICD transmission.   

## 2017-04-24 ENCOUNTER — Encounter: Payer: Self-pay | Admitting: Cardiology

## 2017-04-28 DIAGNOSIS — M25612 Stiffness of left shoulder, not elsewhere classified: Secondary | ICD-10-CM | POA: Diagnosis not present

## 2017-04-30 DIAGNOSIS — M25612 Stiffness of left shoulder, not elsewhere classified: Secondary | ICD-10-CM | POA: Diagnosis not present

## 2017-05-03 LAB — CUP PACEART REMOTE DEVICE CHECK
Battery Remaining Longevity: 83 mo
Battery Remaining Percentage: 81 %
Battery Voltage: 2.99 V
Brady Statistic RV Percent Paced: 1 %
Date Time Interrogation Session: 20190307070017
HighPow Impedance: 50 Ohm
HighPow Impedance: 50 Ohm
Implantable Lead Implant Date: 20080624
Implantable Lead Location: 753860
Implantable Lead Model: 7121
Implantable Pulse Generator Implant Date: 20170310
Lead Channel Impedance Value: 430 Ohm
Lead Channel Pacing Threshold Amplitude: 0.75 V
Lead Channel Pacing Threshold Pulse Width: 0.5 ms
Lead Channel Sensing Intrinsic Amplitude: 11.3 mV
Lead Channel Setting Pacing Amplitude: 2.5 V
Lead Channel Setting Pacing Pulse Width: 0.5 ms
Lead Channel Setting Sensing Sensitivity: 0.5 mV
Pulse Gen Serial Number: 7310877

## 2017-05-05 DIAGNOSIS — M25612 Stiffness of left shoulder, not elsewhere classified: Secondary | ICD-10-CM | POA: Diagnosis not present

## 2017-05-07 DIAGNOSIS — M25612 Stiffness of left shoulder, not elsewhere classified: Secondary | ICD-10-CM | POA: Diagnosis not present

## 2017-05-12 ENCOUNTER — Encounter: Payer: Self-pay | Admitting: Internal Medicine

## 2017-05-12 ENCOUNTER — Ambulatory Visit (INDEPENDENT_AMBULATORY_CARE_PROVIDER_SITE_OTHER): Payer: Medicare Other | Admitting: Internal Medicine

## 2017-05-12 VITALS — BP 140/68 | HR 70 | Ht 72.0 in | Wt 242.0 lb

## 2017-05-12 DIAGNOSIS — Z4502 Encounter for adjustment and management of automatic implantable cardiac defibrillator: Secondary | ICD-10-CM | POA: Diagnosis not present

## 2017-05-12 DIAGNOSIS — I255 Ischemic cardiomyopathy: Secondary | ICD-10-CM

## 2017-05-12 DIAGNOSIS — I472 Ventricular tachycardia, unspecified: Secondary | ICD-10-CM

## 2017-05-12 DIAGNOSIS — M25612 Stiffness of left shoulder, not elsewhere classified: Secondary | ICD-10-CM | POA: Diagnosis not present

## 2017-05-12 DIAGNOSIS — I5022 Chronic systolic (congestive) heart failure: Secondary | ICD-10-CM

## 2017-05-12 LAB — CUP PACEART INCLINIC DEVICE CHECK
Battery Remaining Longevity: 84 mo
Brady Statistic RV Percent Paced: 0 %
Date Time Interrogation Session: 20190326144643
HighPow Impedance: 53.9859
Implantable Lead Implant Date: 20080624
Implantable Lead Location: 753860
Implantable Lead Model: 7121
Implantable Pulse Generator Implant Date: 20170310
Lead Channel Impedance Value: 437.5 Ohm
Lead Channel Pacing Threshold Amplitude: 0.75 V
Lead Channel Pacing Threshold Amplitude: 0.75 V
Lead Channel Pacing Threshold Pulse Width: 0.5 ms
Lead Channel Pacing Threshold Pulse Width: 0.5 ms
Lead Channel Sensing Intrinsic Amplitude: 11.3 mV
Lead Channel Setting Pacing Amplitude: 2.5 V
Lead Channel Setting Pacing Pulse Width: 0.5 ms
Lead Channel Setting Sensing Sensitivity: 0.5 mV
Pulse Gen Serial Number: 7310877

## 2017-05-12 NOTE — Progress Notes (Signed)
Patient Care Team: Tammi Sou, MD as PCP - General (Family Medicine) Milus Banister, MD as Consulting Physician (Gastroenterology) Linus Mako, MD as Consulting Physician (Family Medicine) Carolan Clines, MD as Consulting Physician (Urology) Con Memos as Consulting Physician (Pulmonary Disease) Deboraha Sprang, MD as Consulting Physician (Cardiology) Camillo Flaming, OD as Referring Physician (Optometry) Latanya Maudlin, MD as Consulting Physician (Orthopedic Surgery)   HPI  [name] is a 82 y.o. male is seen in followup for aborted cardiac arrest in the setting of modest ischemic cardiomyopathy with prior bypass surgery    DATE TEST EF   3/16 Myoview  39 % Prior scar        He is status post ICD implantation  He underwent generator replacement 2017   Date Cr K Hgb  12/18 1.36 4.5 13.0         The patient denies chest pain, shortness of breath, nocturnal dyspnea, orthopnea or peripheral edema.  There have been no palpitations, lightheadedness or syncope.   He tore his rotator cuff trying to save a $600 all following of the back of a truck.  He wins   Past Medical History:  Diagnosis Date  . AICD (automatic cardioverter/defibrillator) present   . Anemia, unspecified   . Arthritis   . BPH with obstruction/lower urinary tract symptoms    Dr. Gaynelle Arabian.  [name] CAD (coronary artery disease)    a. MI/CABG 1996. b. VF arrest 2008: cath with severe native 3v disease, 3/3 grafts widely patent, mild to moderate left ventricular systolic dysfunction secondary to anteroapical scar. EF 40-45%.  . Cardiac arrest [facility]) 2008   2008: aborted cardiac arrest 2008 (c/b encephalopathy, VDRF, aspiriation PNA, CHF, elevated LFTs), s/p St. Jude ICD placement.  . Chronic left ventricular systolic dysfunction    Ischemic CM  . Chronic renal insufficiency, stage 3 (moderate) (HCC)    GFR 40s-50s  . Chronic venous insufficiency   . Diabetes mellitus  without complication (HCC)    diet  controlled  . Disorder of bone and cartilage, unspecified   . Erectile dysfunction       "           "      "              "                  "    . Essential hypertension   . GERD (gastroesophageal reflux disease)   . Hx of adenomatous colonic polyps 2003, 2013   Pt declines any further colonoscopies as of 12/2016.  [name] Hypercholesterolemia   . Hypogonadism male    managed by [facility] (Dr. Gaynelle Arabian)  . Ischemic cardiomyopathy    a. EF 40-45% by cath 2008.  EF 39% by nuclear study 2016  . Meniscal injury Jan/Feb 2016   Sustained shoveling snow; repaired by Dr. Gladstone Lighter   . Myocardial infarction ([place])   . Obesity   . OSA on CPAP    sleep study 2008, severe OSA: CPAP 11 cm H2O  . Supraspinatus tendon tear 12/03/2016   Left shoulder.  Left shoulder open rotator cuff repair with graft and anchor (Left)--01/28/17.  [name] Urinary tract infection    diagnosed on 11/13/2015     Past Surgical History:  Procedure Laterality Date  . CARDIAC DEFIBRILLATOR PLACEMENT    . CARDIOVASCULAR STRESS TEST  04/2014   High risk study: large scar, EF 39%, +WM abnl  .  COLONOSCOPY  06/10/11   One diminutive polyp removed (tubular adenoma, no high grade dysplasia).  Repeat colonoscopy 5 yrs (Dr. Ardis Hughs).  . COLONOSCOPY W/ POLYPECTOMY  2003   Adenomatous; repeat 2006 was recommended but this was not done until 06/10/2011 (by different GI MD).  Pt declines any further colonoscopies as of 12/2016.  [name] CORONARY ARTERY BYPASS GRAFT  1996   3 vessel  . EP IMPLANTABLE DEVICE N/A 04/27/2015   Procedure: ICD Generator Changeout;  Surgeon: Deboraha Sprang, MD;  Location: [facility];  Service: Cardiovascular;  Laterality: N/A;  . KNEE CARTILAGE SURGERY  04/2014   Dr. Gladstone Lighter: torn meniscus repair  . PACEMAKER PLACEMENT  07/2006   St Jude current VRRF--generator change 03/2015  . POLYPECTOMY    . right carpal tunnel release     . SHOULDER OPEN ROTATOR CUFF REPAIR Left  01/28/2017   Procedure: Left shoulder open rotator cuff repair with graft and anchor;  Surgeon: Latanya Maudlin, MD;  Location: [facility];  Service: Orthopedics;  Laterality: Left;  Interscalene Block  . TOTAL KNEE ARTHROPLASTY Left 11/21/2015   Procedure: LEFT TOTAL KNEE ARTHROPLASTY;  Surgeon: Latanya Maudlin, MD;  Location: [facility];  Service: Orthopedics;  Laterality: Left;    Current Outpatient Medications  Medication Sig Dispense Refill  . aspirin EC 81 MG tablet Take 81 mg by mouth daily.    [name] atorvastatin (LIPITOR) 20 MG tablet TAKE ONE TABLET BY MOUTH DAILY 90 tablet 1  . b complex vitamins tablet Take 1 tablet by mouth daily.    [name] CALCIUM PO Take 1 tablet by mouth daily.    [name] lisinopril (PRINIVIL,ZESTRIL) 5 MG tablet TAKE ONE TABLET BY MOUTH DAILY 90 tablet 1  . Menthol, Topical Analgesic, (BIOFREEZE EX) Apply 1 application topically daily as needed (for pain).     . metoprolol tartrate (LOPRESSOR) 25 MG tablet Take 1 tablet (25 mg total) by mouth 2 (two) times daily. 180 tablet 1  . Multiple Vitamin (MULTIVITAMIN) tablet Take 1 tablet by mouth daily.    . NON FORMULARY Take 1 tablet by mouth daily. OPC 3.  antioxidant protection    . pantoprazole (PROTONIX) 40 MG tablet TAKE ONE TABLET BY MOUTH DAILY 90 tablet 1  . sildenafil (REVATIO) 20 MG tablet Take 20 mg by mouth as needed (for ED).     [name] testosterone cypionate (DEPOTESTOTERONE CYPIONATE) 100 MG/ML injection Inject 100 mg into the muscle every 14 (fourteen) days. For IM use only      No current facility-administered medications for this visit.     Allergies  Allergen Reactions  . Naproxen Sodium Nausea Only    Review of Systems negative except from HPI and PMH  Physical Exam BP 140/68   Pulse 70   Ht 6' (1.829 m)   Wt 242 lb (109.8 kg)   SpO2 96%   BMI 32.82 kg/m  Well developed and nourished in no acute distress HENT normal Neck supple with JVP-flat Carotids brisk and full without bruits Clear Regular rate and  rhythm, no murmurs or gallops Abd-soft with active BS without hepatomegaly No Clubbing cyanosis edema Skin-warm and dry A & Oriented  Grossly normal sensory and motor function   ECG   Sinus @ 69 14/14/42  Assessment and  Plan  Ischemic Heart Disease ([place])  Syncope  ICD  St Jude The patient's device was interrogated.  The information was reviewed. No changes were made in the programming.      Obesity  Diabetes  No syncope  No intercurrent Ventricular tachycardia  Continue current meds

## 2017-05-12 NOTE — Patient Instructions (Addendum)
Medication Instructions:  Your physician recommends that you continue on your current medications as directed. Please refer to the Current Medication list given to you today.  Labwork: None ordered.  Testing/Procedures: None ordered.  Follow-Up: Your physician recommends that you schedule a follow-up appointment in:   One Year with Dr Caryl Comes  Remote monitoring is used to monitor your ICD from home. This monitoring reduces the number of office visits required to check your device to one time per year. It allows [place] to keep an eye on the functioning of your device to ensure it is working properly. You are scheduled for a device check from home on 07/23/2017. You may send your transmission at any time that day. If you have a wireless device, the transmission will be sent automatically. After your physician reviews your transmission, you will receive a postcard with your next transmission date.   Any Other Special Instructions Will Be Listed Below (If Applicable).     If you need a refill on your cardiac medications before your next appointment, please call your pharmacy.

## 2017-06-11 ENCOUNTER — Other Ambulatory Visit: Payer: Self-pay | Admitting: Family Medicine

## 2017-06-12 ENCOUNTER — Telehealth: Payer: Self-pay | Admitting: Family Medicine

## 2017-06-12 NOTE — Telephone Encounter (Signed)
Copied from [place]. Topic: Quick Communication - Rx Refill/Question >> Jun 12, 2017 11:52 AM [name] wrote: Pt called [name]/c he was told to contact his pcp [name]/c he is needing a new cpap machine and is needing approval from his pcp, call pt to advise

## 2017-06-15 ENCOUNTER — Encounter: Payer: Self-pay | Admitting: Family Medicine

## 2017-06-15 NOTE — Telephone Encounter (Signed)
I'll write rx, but ask pt if the resp therapy company has an order form I need to fill out---this is usually what they do (tell him we don't have these order forms but resp therapy company does).-thx

## 2017-06-15 NOTE — Telephone Encounter (Signed)
Please advise. Thanks.  

## 2017-06-15 NOTE — Telephone Encounter (Signed)
Patient's CPAP machine broke, he is renting one until he can get a new one in July (insurance will pay for at this time) and stated that he just needs a prescription for [facility] for the rental machine.

## 2017-06-16 NOTE — Telephone Encounter (Signed)
OK, I wrote rx and put it on Heather's desk.

## 2017-06-16 NOTE — Telephone Encounter (Signed)
Patient notified and verbalized understanding. 

## 2017-07-23 ENCOUNTER — Ambulatory Visit (INDEPENDENT_AMBULATORY_CARE_PROVIDER_SITE_OTHER): Payer: Medicare Other | Admitting: *Deleted

## 2017-07-23 DIAGNOSIS — I472 Ventricular tachycardia, unspecified: Secondary | ICD-10-CM

## 2017-07-23 DIAGNOSIS — I255 Ischemic cardiomyopathy: Secondary | ICD-10-CM

## 2017-07-23 NOTE — Progress Notes (Signed)
Remote ICD transmission.   

## 2017-08-31 ENCOUNTER — Encounter: Payer: Self-pay | Admitting: Family Medicine

## 2017-09-08 ENCOUNTER — Ambulatory Visit: Payer: Medicare Other | Admitting: Podiatry

## 2017-09-09 DIAGNOSIS — H25043 Posterior subcapsular polar age-related cataract, bilateral: Secondary | ICD-10-CM | POA: Diagnosis not present

## 2017-09-09 DIAGNOSIS — H43813 Vitreous degeneration, bilateral: Secondary | ICD-10-CM | POA: Diagnosis not present

## 2017-09-09 DIAGNOSIS — H25013 Cortical age-related cataract, bilateral: Secondary | ICD-10-CM | POA: Diagnosis not present

## 2017-09-09 DIAGNOSIS — H2513 Age-related nuclear cataract, bilateral: Secondary | ICD-10-CM | POA: Diagnosis not present

## 2017-09-15 LAB — CUP PACEART REMOTE DEVICE CHECK
Date Time Interrogation Session: 20190730100751
Implantable Lead Implant Date: 20080624
Implantable Lead Location: 753860
Implantable Lead Model: 7121
Implantable Pulse Generator Implant Date: 20170310
Pulse Gen Serial Number: 7310877

## 2017-09-24 ENCOUNTER — Other Ambulatory Visit: Payer: Self-pay | Admitting: Family Medicine

## 2017-10-16 DIAGNOSIS — H25812 Combined forms of age-related cataract, left eye: Secondary | ICD-10-CM | POA: Diagnosis not present

## 2017-10-16 DIAGNOSIS — H25811 Combined forms of age-related cataract, right eye: Secondary | ICD-10-CM | POA: Diagnosis not present

## 2017-10-20 ENCOUNTER — Other Ambulatory Visit: Payer: Self-pay | Admitting: Family Medicine

## 2017-10-22 ENCOUNTER — Ambulatory Visit (INDEPENDENT_AMBULATORY_CARE_PROVIDER_SITE_OTHER): Payer: Medicare Other | Admitting: *Deleted

## 2017-10-22 DIAGNOSIS — I255 Ischemic cardiomyopathy: Secondary | ICD-10-CM

## 2017-10-22 DIAGNOSIS — I4901 Ventricular fibrillation: Secondary | ICD-10-CM

## 2017-10-22 NOTE — Progress Notes (Signed)
Remote ICD transmission.   

## 2017-10-30 DIAGNOSIS — H2512 Age-related nuclear cataract, left eye: Secondary | ICD-10-CM | POA: Diagnosis not present

## 2017-10-30 DIAGNOSIS — H25812 Combined forms of age-related cataract, left eye: Secondary | ICD-10-CM | POA: Diagnosis not present

## 2017-11-09 DIAGNOSIS — Z23 Encounter for immunization: Secondary | ICD-10-CM | POA: Diagnosis not present

## 2017-11-09 DIAGNOSIS — H2511 Age-related nuclear cataract, right eye: Secondary | ICD-10-CM | POA: Diagnosis not present

## 2017-11-13 DIAGNOSIS — H25811 Combined forms of age-related cataract, right eye: Secondary | ICD-10-CM | POA: Diagnosis not present

## 2017-11-13 DIAGNOSIS — H2511 Age-related nuclear cataract, right eye: Secondary | ICD-10-CM | POA: Diagnosis not present

## 2017-11-17 ENCOUNTER — Other Ambulatory Visit: Payer: Self-pay | Admitting: Family Medicine

## 2017-11-17 LAB — CUP PACEART REMOTE DEVICE CHECK
Battery Remaining Longevity: 79 mo
Battery Remaining Percentage: 78 %
Battery Voltage: 2.98 V
Brady Statistic RV Percent Paced: 1 %
Date Time Interrogation Session: 20190905060015
HighPow Impedance: 52 Ohm
HighPow Impedance: 52 Ohm
Implantable Lead Implant Date: 20080624
Implantable Lead Location: 753860
Implantable Lead Model: 7121
Implantable Pulse Generator Implant Date: 20170310
Lead Channel Impedance Value: 440 Ohm
Lead Channel Pacing Threshold Amplitude: 0.75 V
Lead Channel Pacing Threshold Pulse Width: 0.5 ms
Lead Channel Sensing Intrinsic Amplitude: 11.3 mV
Lead Channel Setting Pacing Amplitude: 2.5 V
Lead Channel Setting Pacing Pulse Width: 0.5 ms
Lead Channel Setting Sensing Sensitivity: 0.5 mV
Pulse Gen Serial Number: 7310877

## 2017-12-07 ENCOUNTER — Ambulatory Visit (INDEPENDENT_AMBULATORY_CARE_PROVIDER_SITE_OTHER): Payer: Medicare Other | Admitting: Family Medicine

## 2017-12-07 ENCOUNTER — Encounter: Payer: Self-pay | Admitting: Family Medicine

## 2017-12-07 VITALS — BP 109/62 | HR 64 | Temp 97.9°F | Resp 16 | Ht 72.0 in | Wt 243.1 lb

## 2017-12-07 DIAGNOSIS — I10 Essential (primary) hypertension: Secondary | ICD-10-CM | POA: Diagnosis not present

## 2017-12-07 DIAGNOSIS — E669 Obesity, unspecified: Secondary | ICD-10-CM

## 2017-12-07 DIAGNOSIS — N183 Chronic kidney disease, stage 3 unspecified: Secondary | ICD-10-CM

## 2017-12-07 DIAGNOSIS — I255 Ischemic cardiomyopathy: Secondary | ICD-10-CM | POA: Diagnosis not present

## 2017-12-07 DIAGNOSIS — E119 Type 2 diabetes mellitus without complications: Secondary | ICD-10-CM | POA: Diagnosis not present

## 2017-12-07 LAB — BASIC METABOLIC PANEL
BUN: 19 mg/dL (ref 6–23)
CO2: 27 mEq/L (ref 19–32)
Calcium: 9.4 mg/dL (ref 8.4–10.5)
Chloride: 103 mEq/L (ref 96–112)
Creatinine, Ser: 1.45 mg/dL (ref 0.40–1.50)
GFR: 49.78 mL/min — ABNORMAL LOW (ref >60.00)
Glucose, Bld: 202 mg/dL — ABNORMAL HIGH (ref 70–99)
Potassium: 4.3 mEq/L (ref 3.5–5.1)
Sodium: 139 mEq/L (ref 135–145)

## 2017-12-07 LAB — HEMOGLOBIN A1C: Hgb A1c MFr Bld: 6.8 % — ABNORMAL HIGH (ref 4.6–6.5)

## 2017-12-07 MED ORDER — PANTOPRAZOLE SODIUM 40 MG PO TBEC: 40.0000 mg | DELAYED_RELEASE_TABLET | Freq: Every day | ORAL | 1 refills | Status: DC

## 2017-12-07 MED ORDER — ATORVASTATIN CALCIUM 20 MG PO TABS: 20.0000 mg | ORAL_TABLET | Freq: Every day | ORAL | 1 refills | Status: DC

## 2017-12-07 MED ORDER — LISINOPRIL 5 MG PO TABS: 5.0000 mg | ORAL_TABLET | Freq: Every day | ORAL | 1 refills | Status: DC

## 2017-12-07 MED ORDER — METOPROLOL TARTRATE 25 MG PO TABS: 25.0000 mg | ORAL_TABLET | Freq: Two times a day (BID) | ORAL | 1 refills | Status: DC

## 2017-12-07 NOTE — Progress Notes (Signed)
OFFICE VISIT  12/07/2017   CC:  Chief Complaint  Patient presents with  . Follow-up    RCI, pt is not fasting.      HPI:    Patient is a 82 y.o. Caucasian male who presents for 6 mo f/u diet-controlled DM, HTN, HLD, CRI III. Shoulder surgery since I last saw him went well. He laments about less activity and a poor diet the last 6 -8 mo.  No home bp or glucose monitoring.   CRI: he takes no nsaids now.  Hydrates reasonably well. Compliant with statin daily, no side effects.  Has been off his testost that urol was following him for, also has ED that he needs to seek further options for since he says cialis doesn't work.  He has been lost to f/u with urol for approx 10 mo.  ROS: no CP, no SOB, no wheezing, no cough, no dizziness, no HAs, no rashes, no melena/hematochezia.  No polyuria or polydipsia.  No myalgias or arthralgias.   Past Medical History:  Diagnosis Date  . AICD (automatic cardioverter/defibrillator) present   . Anemia, unspecified   . Arthritis   . BPH with obstruction/lower urinary tract symptoms    Dr. Gaynelle Arabian.  [name] CAD (coronary artery disease)    a. MI/CABG 1996. b. VF arrest 2008: cath with severe native 3v disease, 3/3 grafts widely patent, mild to moderate left ventricular systolic dysfunction secondary to anteroapical scar. EF 40-45%.  . Cardiac arrest [facility]) 2008   2008: aborted cardiac arrest 2008 (c/b encephalopathy, VDRF, aspiriation PNA, CHF, elevated LFTs), s/p St. Jude ICD placement.  . Chronic left ventricular systolic dysfunction    Ischemic CM  . Chronic renal insufficiency, stage 3 (moderate) (HCC)    GFR 40s-50s  . Chronic venous insufficiency   . Diabetes mellitus without complication (HCC)    diet  controlled  . Disorder of bone and cartilage, unspecified   . Erectile dysfunction       "           "      "              "                  "    . Essential hypertension   . GERD (gastroesophageal reflux disease)   . Hx of adenomatous  colonic polyps 2003, 2013   Pt declines any further colonoscopies as of 12/2016.  [name] Hypercholesterolemia   . Hypogonadism male    managed by [facility] (Dr. Gaynelle Arabian)  . Ischemic cardiomyopathy    a. EF 40-45% by cath 2008.  EF 39% by nuclear study 2016  . Meniscal injury Jan/Feb 2016   Sustained shoveling snow; repaired by Dr. Gladstone Lighter   . Myocardial infarction ([place])   . Obesity   . OSA on CPAP    sleep study 2008, severe OSA: CPAP 11 cm H2O  . Supraspinatus tendon tear 12/03/2016   Left shoulder.  Left shoulder open rotator cuff repair with graft and anchor (Left)--01/28/17.  [name] Urinary tract infection    diagnosed on 11/13/2015     Past Surgical History:  Procedure Laterality Date  . CARDIAC DEFIBRILLATOR PLACEMENT    . CARDIOVASCULAR STRESS TEST  04/2014   High risk study: large scar, EF 39%, +WM abnl  . COLONOSCOPY  06/10/11   One diminutive polyp removed (tubular adenoma, no high grade dysplasia).  Repeat colonoscopy 5 yrs (Dr. Ardis Hughs).  . COLONOSCOPY W/ POLYPECTOMY  2003   Adenomatous; repeat 2006 was recommended but this was not done until 06/10/2011 (by different GI MD).  Pt declines any further colonoscopies as of 12/2016.  [name] CORONARY ARTERY BYPASS GRAFT  1996   3 vessel  . EP IMPLANTABLE DEVICE N/A 04/27/2015   Procedure: ICD Generator Changeout;  Surgeon: Deboraha Sprang, MD;  Location: [facility];  Service: Cardiovascular;  Laterality: N/A;  . KNEE CARTILAGE SURGERY  04/2014   Dr. Gladstone Lighter: torn meniscus repair  . PACEMAKER PLACEMENT  07/2006   St Jude current VRRF--generator change 03/2015  . POLYPECTOMY    . right carpal tunnel release     . SHOULDER OPEN ROTATOR CUFF REPAIR Left 01/28/2017   Procedure: Left shoulder open rotator cuff repair with graft and anchor;  Surgeon: Latanya Maudlin, MD;  Location: [facility];  Service: Orthopedics;  Laterality: Left;  Interscalene Block  . TOTAL KNEE ARTHROPLASTY Left 11/21/2015   Procedure: LEFT TOTAL KNEE ARTHROPLASTY;   Surgeon: Latanya Maudlin, MD;  Location: [facility];  Service: Orthopedics;  Laterality: Left;    Outpatient Medications Prior to Visit  Medication Sig Dispense Refill  . aspirin EC 81 MG tablet Take 81 mg by mouth daily.    [name] b complex vitamins tablet Take 1 tablet by mouth daily.    [name] CALCIUM PO Take 1 tablet by mouth daily.    . Menthol, Topical Analgesic, (BIOFREEZE EX) Apply 1 application topically daily as needed (for pain).     . Multiple Vitamin (MULTIVITAMIN) tablet Take 1 tablet by mouth daily.    . NON FORMULARY Take 1 tablet by mouth daily. OPC 3.  antioxidant protection    . atorvastatin (LIPITOR) 20 MG tablet TAKE ONE TABLET BY MOUTH DAILY 90 tablet 0  . lisinopril (PRINIVIL,ZESTRIL) 5 MG tablet TAKE ONE TABLET BY MOUTH DAILY 30 tablet 0  . metoprolol tartrate (LOPRESSOR) 25 MG tablet TAKE ONE TABLET BY MOUTH TWICE A DAY 180 tablet 0  . pantoprazole (PROTONIX) 40 MG tablet TAKE ONE TABLET BY MOUTH DAILY 90 tablet 0  . sildenafil (REVATIO) 20 MG tablet Take 20 mg by mouth as needed (for ED).     [name] testosterone cypionate (DEPOTESTOTERONE CYPIONATE) 100 MG/ML injection Inject 100 mg into the muscle every 14 (fourteen) days. For IM use only      No facility-administered medications prior to visit.     Allergies  Allergen Reactions  . Naproxen Sodium Nausea Only    ROS As per HPI  PE: Blood pressure 109/62, pulse 64, temperature 97.9 F (36.6 C), temperature source Oral, resp. rate 16, height 6' (1.829 m), weight 243 lb 2 oz (110.3 kg), SpO2 96 %. Gen: Alert, well appearing.  Patient is oriented to person, place, time, and situation. AFFECT: pleasant, lucid thought and speech. CV: Regularly irreg: 3 regular beats followed by a premature beat-->repeat.  No m/r/g. Chest is clear, no wheezing or rales. Normal symmetric air entry throughout both lung fields. No chest wall deformities or tenderness. EXT: no clubbing or cyanosis.  no edema.   LABS:  Lab Results  Component Value  Date   TSH 4.20 03/19/2010   Lab Results  Component Value Date   WBC 6.7 01/26/2017   HGB 13.0 01/26/2017   HCT 38.0 (L) 01/26/2017   MCV 92.2 01/26/2017   PLT 222 01/26/2017   Lab Results  Component Value Date   CREATININE 1.36 (H) 01/26/2017   BUN 22 (H) 01/26/2017   NA 139 01/26/2017  K 4.5 01/26/2017   CL 107 01/26/2017   CO2 26 01/26/2017   Lab Results  Component Value Date   ALT 30 01/26/2017   AST 27 01/26/2017   ALKPHOS 56 01/26/2017   BILITOT 0.7 01/26/2017   Lab Results  Component Value Date   CHOL 111 12/26/2016   Lab Results  Component Value Date   HDL 39.90 12/26/2016   Lab Results  Component Value Date   LDLCALC 56 12/26/2016   Lab Results  Component Value Date   TRIG 76.0 12/26/2016   Lab Results  Component Value Date   CHOLHDL 3 12/26/2016   Lab Results  Component Value Date   PSA 0.34 12/18/2011   PSA 0.43 03/24/2011   PSA 0.34 03/19/2010   Lab Results  Component Value Date   HGBA1C 6.7 (H) 12/26/2016    IMPRESSION AND PLAN:  1) DM 2, diet controlled. Gets eye exam tomorrow per his report. Hba1c today.  2) HTN: normal here.  The current medical regimen is effective;  continue present plan and medications. BMET today.  3) HLD: tolerating statin, not fasting today.  Plan repeat in 76mo.  4) CRI III: avoiding nsaids, hydrating OK. BMET today.  5) Hypogonadism and ED: pt is to call his urology office to re-establish care (his provider there has retired--Dr. Evangeline Gula I told him they will reassign him to another of their providers).  An After Visit Summary was printed and given to the patient.  FOLLOW UP: Return in about 6 months (around 06/08/2018) for routine chronic illness f/u.  Signed:  Crissie Sickles, MD           12/07/2017

## 2018-01-21 ENCOUNTER — Ambulatory Visit (INDEPENDENT_AMBULATORY_CARE_PROVIDER_SITE_OTHER): Payer: Medicare Other

## 2018-01-21 DIAGNOSIS — I4901 Ventricular fibrillation: Secondary | ICD-10-CM

## 2018-01-21 NOTE — Progress Notes (Signed)
Remote ICD transmission.   

## 2018-01-27 ENCOUNTER — Encounter: Payer: Self-pay | Admitting: Cardiology

## 2018-03-13 LAB — CUP PACEART REMOTE DEVICE CHECK
Battery Remaining Longevity: 77 mo
Battery Remaining Percentage: 75 %
Battery Voltage: 2.98 V
Brady Statistic RV Percent Paced: 1 %
Date Time Interrogation Session: 20191205070014
HighPow Impedance: 47 Ohm
HighPow Impedance: 47 Ohm
Implantable Lead Implant Date: 20080624
Implantable Lead Location: 753860
Implantable Lead Model: 7121
Implantable Pulse Generator Implant Date: 20170310
Lead Channel Impedance Value: 430 Ohm
Lead Channel Pacing Threshold Amplitude: 0.75 V
Lead Channel Pacing Threshold Pulse Width: 0.5 ms
Lead Channel Sensing Intrinsic Amplitude: 11.3 mV
Lead Channel Setting Pacing Amplitude: 2.5 V
Lead Channel Setting Pacing Pulse Width: 0.5 ms
Lead Channel Setting Sensing Sensitivity: 0.5 mV
Pulse Gen Serial Number: 7310877

## 2018-04-20 ENCOUNTER — Encounter: Payer: Self-pay | Admitting: Internal Medicine

## 2018-04-22 ENCOUNTER — Ambulatory Visit (INDEPENDENT_AMBULATORY_CARE_PROVIDER_SITE_OTHER): Payer: Medicare Other | Admitting: *Deleted

## 2018-04-22 DIAGNOSIS — I4901 Ventricular fibrillation: Secondary | ICD-10-CM | POA: Diagnosis not present

## 2018-04-24 LAB — CUP PACEART REMOTE DEVICE CHECK
Battery Remaining Longevity: 76 mo
Battery Remaining Percentage: 74 %
Battery Voltage: 2.98 V
Brady Statistic RV Percent Paced: 1 %
Date Time Interrogation Session: 20200305070016
HighPow Impedance: 47 Ohm
HighPow Impedance: 48 Ohm
Implantable Lead Implant Date: 20080624
Implantable Lead Location: 753860
Implantable Lead Model: 7121
Implantable Pulse Generator Implant Date: 20170310
Lead Channel Impedance Value: 430 Ohm
Lead Channel Pacing Threshold Amplitude: 0.75 V
Lead Channel Pacing Threshold Pulse Width: 0.5 ms
Lead Channel Sensing Intrinsic Amplitude: 11.3 mV
Lead Channel Setting Pacing Amplitude: 2.5 V
Lead Channel Setting Pacing Pulse Width: 0.5 ms
Lead Channel Setting Sensing Sensitivity: 0.5 mV
Pulse Gen Serial Number: 7310877

## 2018-04-30 NOTE — Progress Notes (Signed)
Remote ICD transmission.   

## 2018-05-03 ENCOUNTER — Telehealth: Payer: Self-pay

## 2018-05-03 ENCOUNTER — Encounter: Payer: Self-pay | Admitting: Cardiology

## 2018-05-03 NOTE — Telephone Encounter (Signed)
Pt agrees to have appt rescheduled.

## 2018-05-06 ENCOUNTER — Encounter: Payer: Medicare Other | Admitting: Internal Medicine

## 2018-05-17 ENCOUNTER — Telehealth: Payer: Self-pay

## 2018-05-17 NOTE — Telephone Encounter (Signed)
I called patient about his follow up appointment and offered virtual visit for Wednesday or Thursday, patient declined. He states that he will wait for follow up to come in the office. He is feeling fine and will call if he needs anything or anything changes.

## 2018-06-05 ENCOUNTER — Other Ambulatory Visit: Payer: Self-pay | Admitting: Family Medicine

## 2018-06-07 NOTE — Telephone Encounter (Signed)
Pt has appt tomorrow with Dr Anitra Lauth. Will refill at appt per Dr Anitra Lauth

## 2018-06-08 ENCOUNTER — Other Ambulatory Visit: Payer: Self-pay

## 2018-06-08 ENCOUNTER — Ambulatory Visit: Payer: Medicare Other | Admitting: Family Medicine

## 2018-06-08 ENCOUNTER — Ambulatory Visit (INDEPENDENT_AMBULATORY_CARE_PROVIDER_SITE_OTHER): Payer: Medicare Other | Admitting: Family Medicine

## 2018-06-08 ENCOUNTER — Encounter: Payer: Self-pay | Admitting: Family Medicine

## 2018-06-08 DIAGNOSIS — E119 Type 2 diabetes mellitus without complications: Secondary | ICD-10-CM | POA: Diagnosis not present

## 2018-06-08 DIAGNOSIS — I10 Essential (primary) hypertension: Secondary | ICD-10-CM

## 2018-06-08 DIAGNOSIS — E7800 Pure hypercholesterolemia, unspecified: Secondary | ICD-10-CM

## 2018-06-08 DIAGNOSIS — N183 Chronic kidney disease, stage 3 unspecified: Secondary | ICD-10-CM

## 2018-06-08 MED ORDER — METOPROLOL TARTRATE 25 MG PO TABS: 25.0000 mg | ORAL_TABLET | Freq: Two times a day (BID) | ORAL | 1 refills | Status: DC

## 2018-06-08 MED ORDER — ATORVASTATIN CALCIUM 20 MG PO TABS: 20.0000 mg | ORAL_TABLET | Freq: Every day | ORAL | 1 refills | Status: DC

## 2018-06-08 MED ORDER — LISINOPRIL 5 MG PO TABS: 5.0000 mg | ORAL_TABLET | Freq: Every day | ORAL | 1 refills | Status: DC

## 2018-06-08 MED ORDER — PANTOPRAZOLE SODIUM 40 MG PO TBEC
40.0000 mg | DELAYED_RELEASE_TABLET | Freq: Every day | ORAL | 1 refills | Status: DC
Start: 2018-06-08 — End: 2018-12-02

## 2018-06-08 NOTE — Addendum Note (Signed)
Addended by: Tammi Sou on: 06/08/2018 08:54 AM   Modules accepted: Orders

## 2018-06-08 NOTE — Progress Notes (Signed)
Virtual Visit via Video Note  I connected with [name]  on 06/08/18 at  8:20 AM EDT by telephone (patient does have the technology required for telemedicine visit) and verified that I am speaking with the correct person using two identifiers.  Location patient: home Location provider:work or home office Persons participating in the virtual visit: patient, provider  I discussed the limitations of evaluation and management by telemedicine/telephone and the availability of in person appointments. The patient expressed understanding and agreed to proceed.  Telemedicine visit is a necessity given the COVID-19 restrictions in place at the current time.  Patient Care Team    Relationship Specialty Notifications Start End  Tammi Sou, MD PCP - General Family Medicine  03/21/11    Comment: Lacie Draft, Melene Plan, MD Consulting Physician Gastroenterology  06/17/11   Linus Mako, MD Consulting Physician Family Medicine  06/30/12    Comment: Vascular/vein clinic  Carolan Clines, MD (Inactive) Consulting Physician Urology  04/20/13   Con Memos Consulting Physician Pulmonary Disease  10/12/13   Deboraha Sprang, MD Consulting Physician Cardiology  04/16/15   Camillo Flaming, [employer] Referring Physician Optometry  07/31/15   Latanya Maudlin, MD Consulting Physician Orthopedic Surgery  11/22/15     HPI: 83 y/o WM being seen for 6 mo f/u DM 2, HTN, HLD, and CRI III. He has an extensive cardiac hx, including CAD, hx of MI, CABG, mild left ventricular systolic dysfunction, Vtach/Vfib, and has an ICD-->followed by Dr. Caryl Comes.  [name] states " I'm as healthy as a horse".  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  DM: diet controlled.  A1c 6.8% last visit.  HTN: compliant with bp med, no home bp monitoring being done.  HLD: tolerating statin.  Overdue for lipid monitoring.  CRI III: kidney function stable at last f/u, electrolytes normal. He tries to hydrate well and he avoids NSAIDs.  ROS:  no CP, no SOB, no wheezing, no cough, no dizziness, no HAs, no rashes, no melena/hematochezia.  No polyuria or polydipsia.  No myalgias or arthralgias.   Past Medical History:  Diagnosis Date  . AICD (automatic cardioverter/defibrillator) present   . Anemia, unspecified   . Arthritis   . BPH with obstruction/lower urinary tract symptoms    Dr. Gaynelle Arabian.  [name] CAD (coronary artery disease)    a. MI/CABG 1996. b. VF arrest 2008: cath with severe native 3v disease, 3/3 grafts widely patent, mild to moderate left ventricular systolic dysfunction secondary to anteroapical scar. EF 40-45%.  . Cardiac arrest [facility]) 2008   2008: aborted cardiac arrest 2008 (c/b encephalopathy, VDRF, aspiriation PNA, CHF, elevated LFTs), s/p St. Jude ICD placement.  . Chronic left ventricular systolic dysfunction    Ischemic CM  . Chronic renal insufficiency, stage 3 (moderate) (HCC)    GFR 40s-50s  . Chronic venous insufficiency   . Diabetes mellitus without complication (HCC)    diet  controlled  . Disorder of bone and cartilage, unspecified   . Erectile dysfunction       "           "      "              "                  "    . Essential hypertension   . GERD (gastroesophageal reflux disease)   . Hx of adenomatous colonic polyps 2003, 2013   [name] declines any further colonoscopies  as of 12/2016.  [name] Hypercholesterolemia   . Hypogonadism male    managed by [facility] (Dr. Gaynelle Arabian)  . Ischemic cardiomyopathy    a. EF 40-45% by cath 2008.  EF 39% by nuclear study 2016  . Meniscal injury Jan/Feb 2016   Sustained shoveling snow; repaired by Dr. Gladstone Lighter   . Myocardial infarction ([place])   . Obesity   . OSA on CPAP    sleep study 2008, severe OSA: CPAP 11 cm H2O  . Supraspinatus tendon tear 12/03/2016   Left shoulder.  Left shoulder open rotator cuff repair with graft and anchor (Left)--01/28/17.  [name] Urinary tract infection    diagnosed on 11/13/2015     Past Surgical History:  Procedure Laterality  Date  . CARDIAC DEFIBRILLATOR PLACEMENT    . CARDIOVASCULAR STRESS TEST  04/2014   High risk study: large scar, EF 39%, +WM abnl  . COLONOSCOPY  06/10/11   One diminutive polyp removed (tubular adenoma, no high grade dysplasia).  Repeat colonoscopy 5 yrs (Dr. Ardis Hughs).  . COLONOSCOPY W/ POLYPECTOMY  2003   Adenomatous; repeat 2006 was recommended but this was not done until 06/10/2011 (by different GI MD).  [name] declines any further colonoscopies as of 12/2016.  [name] CORONARY ARTERY BYPASS GRAFT  1996   3 vessel  . EP IMPLANTABLE DEVICE N/A 04/27/2015   Procedure: ICD Generator Changeout;  Surgeon: Deboraha Sprang, MD;  Location: [facility];  Service: Cardiovascular;  Laterality: N/A;  . KNEE CARTILAGE SURGERY  04/2014   Dr. Gladstone Lighter: torn meniscus repair  . PACEMAKER PLACEMENT  07/2006   St Jude current VRRF--generator change 03/2015  . POLYPECTOMY    . right carpal tunnel release     . SHOULDER OPEN ROTATOR CUFF REPAIR Left 01/28/2017   Procedure: Left shoulder open rotator cuff repair with graft and anchor;  Surgeon: Latanya Maudlin, MD;  Location: [facility];  Service: Orthopedics;  Laterality: Left;  Interscalene Block  . TOTAL KNEE ARTHROPLASTY Left 11/21/2015   Procedure: LEFT TOTAL KNEE ARTHROPLASTY;  Surgeon: Latanya Maudlin, MD;  Location: [facility];  Service: Orthopedics;  Laterality: Left;    Family History  Problem Relation Age of Onset  . Heart disease Mother   . Stroke Mother   . Heart disease Father   . Colon cancer Neg Hx   . Esophageal cancer Neg Hx   . Stomach cancer Neg Hx   . Rectal cancer Neg Hx       Current Outpatient Medications:  .  aspirin EC 81 MG tablet, Take 81 mg by mouth daily., Disp: , Rfl:  .  atorvastatin (LIPITOR) 20 MG tablet, Take 1 tablet (20 mg total) by mouth daily., Disp: 90 tablet, Rfl: 1 .  b complex vitamins tablet, Take 1 tablet by mouth daily., Disp: , Rfl:  .  CALCIUM PO, Take 1 tablet by mouth daily., Disp: , Rfl:  .  lisinopril  (PRINIVIL,ZESTRIL) 5 MG tablet, Take 1 tablet (5 mg total) by mouth daily., Disp: 90 tablet, Rfl: 1 .  Menthol, Topical Analgesic, (BIOFREEZE EX), Apply 1 application topically daily as needed (for pain). , Disp: , Rfl:  .  metoprolol tartrate (LOPRESSOR) 25 MG tablet, Take 1 tablet (25 mg total) by mouth 2 (two) times daily., Disp: 180 tablet, Rfl: 1 .  Multiple Vitamin (MULTIVITAMIN) tablet, Take 1 tablet by mouth daily., Disp: , Rfl:  .  NON FORMULARY, Take 1 tablet by mouth daily. OPC 3.  antioxidant protection, Disp: , Rfl:  .  pantoprazole (PROTONIX) 40 MG tablet, Take 1 tablet (40 mg total) by mouth daily., Disp: 90 tablet, Rfl: 1  EXAM:  VITALS per patient if applicable: There were no vitals taken for this visit.  [name] sounds well, is lucid in thought and speech.  No further exam.  LABS: none today    Chemistry      Component Value Date/Time   NA 139 12/07/2017 0923   K 4.3 12/07/2017 0923   CL 103 12/07/2017 0923   CO2 27 12/07/2017 0923   BUN 19 12/07/2017 0923   CREATININE 1.45 12/07/2017 0923   CREATININE 1.34 (H) 04/23/2015 1512      Component Value Date/Time   CALCIUM 9.4 12/07/2017 0923   ALKPHOS 56 01/26/2017 1105   AST 27 01/26/2017 1105   ALT 30 01/26/2017 1105   BILITOT 0.7 01/26/2017 1105     Lab Results  Component Value Date   CHOL 111 12/26/2016   HDL 39.90 12/26/2016   LDLCALC 56 12/26/2016   TRIG 76.0 12/26/2016   CHOLHDL 3 12/26/2016   Lab Results  Component Value Date   HGBA1C 6.8 (H) 12/07/2017    ASSESSMENT AND PLAN:  Discussed the following assessment and plan:  1) DM 2, diet controlled. No home glucose monitoring. HbA1c and urine microalb/cr --future. Lytes/cr-future.  2) HTN: no home monitoring. Has historically been well controlled on low dose lisinopril and lopressor.  3) HLD: tolerating statin. FLP and hepatic panel--future.  4) Ischemic cardiomyopathy, with hx of Vfib--> gets regular f/u with Dr. Netta Corrigan  monitoring. Continue ASA, statin, BB, ACE-I.  I discussed the assessment and treatment plan with the patient. The patient was provided an opportunity to ask questions and all were answered. The patient agreed with the plan and demonstrated an understanding of the instructions.   The patient was advised to call back or seek an in-person evaluation if the symptoms worsen or if the condition fails to improve as anticipated.  Spent 10 min with [name] today, with >50% of this time spent in counseling and care coordination regarding the above problems.  F/u: 6 mo RCI  Signed:  Crissie Sickles, MD           06/08/2018

## 2018-06-14 ENCOUNTER — Other Ambulatory Visit (INDEPENDENT_AMBULATORY_CARE_PROVIDER_SITE_OTHER): Payer: Medicare Other

## 2018-06-14 ENCOUNTER — Other Ambulatory Visit: Payer: Self-pay

## 2018-06-14 DIAGNOSIS — N183 Chronic kidney disease, stage 3 unspecified: Secondary | ICD-10-CM

## 2018-06-14 DIAGNOSIS — I10 Essential (primary) hypertension: Secondary | ICD-10-CM

## 2018-06-14 DIAGNOSIS — E7800 Pure hypercholesterolemia, unspecified: Secondary | ICD-10-CM

## 2018-06-14 DIAGNOSIS — E119 Type 2 diabetes mellitus without complications: Secondary | ICD-10-CM | POA: Diagnosis not present

## 2018-06-14 LAB — COMPREHENSIVE METABOLIC PANEL
ALT: 24 U/L (ref 0–53)
AST: 20 U/L (ref 0–37)
Albumin: 4.3 g/dL (ref 3.5–5.2)
Alkaline Phosphatase: 57 U/L (ref 39–117)
BUN: 20 mg/dL (ref 6–23)
CO2: 26 mEq/L (ref 19–32)
Calcium: 9.2 mg/dL (ref 8.4–10.5)
Chloride: 102 mEq/L (ref 96–112)
Creatinine, Ser: 1.47 mg/dL (ref 0.40–1.50)
GFR: 46.05 mL/min — ABNORMAL LOW (ref >60.00)
Glucose, Bld: 124 mg/dL — ABNORMAL HIGH (ref 70–99)
Potassium: 4.6 mEq/L (ref 3.5–5.1)
Sodium: 138 mEq/L (ref 135–145)
Total Bilirubin: 0.9 mg/dL (ref 0.2–1.2)
Total Protein: 7.2 g/dL (ref 6.0–8.3)

## 2018-06-14 LAB — LIPID PANEL
Cholesterol: 117 mg/dL (ref 0–200)
HDL: 38.5 mg/dL — ABNORMAL LOW (ref >39.00)
LDL Cholesterol: 60 mg/dL (ref 0–99)
NonHDL: 78.76
Total CHOL/HDL Ratio: 3
Triglycerides: 92 mg/dL (ref 0.0–149.0)
VLDL: 18.4 mg/dL (ref 0.0–40.0)

## 2018-06-14 LAB — HEMOGLOBIN A1C: Hgb A1c MFr Bld: 7.4 % — ABNORMAL HIGH (ref 4.6–6.5)

## 2018-06-14 LAB — MICROALBUMIN / CREATININE URINE RATIO
Creatinine,U: 91.7 mg/dL
Microalb Creat Ratio: 0.8 mg/g (ref 0.0–30.0)
Microalb, Ur: <0.7 mg/dL (ref 0.0–1.9)

## 2018-07-02 ENCOUNTER — Telehealth: Payer: Self-pay

## 2018-07-02 NOTE — Telephone Encounter (Signed)
Received form from [place]. Pt is wanting to get new supplies. Placed on provider's desk to review and sign.  Fax # is [phone]

## 2018-07-02 NOTE — Telephone Encounter (Signed)
Forms signed.

## 2018-07-22 ENCOUNTER — Ambulatory Visit (INDEPENDENT_AMBULATORY_CARE_PROVIDER_SITE_OTHER): Payer: Medicare Other | Admitting: *Deleted

## 2018-07-22 DIAGNOSIS — I4901 Ventricular fibrillation: Secondary | ICD-10-CM | POA: Diagnosis not present

## 2018-07-22 DIAGNOSIS — I255 Ischemic cardiomyopathy: Secondary | ICD-10-CM

## 2018-07-23 LAB — CUP PACEART REMOTE DEVICE CHECK
Battery Remaining Longevity: 74 mo
Battery Remaining Percentage: 72 %
Battery Voltage: 2.96 V
Brady Statistic RV Percent Paced: 1 %
Date Time Interrogation Session: 20200604060020
HighPow Impedance: 54 Ohm
HighPow Impedance: 54 Ohm
Implantable Lead Implant Date: 20080624
Implantable Lead Location: 753860
Implantable Lead Model: 7121
Implantable Pulse Generator Implant Date: 20170310
Lead Channel Impedance Value: 460 Ohm
Lead Channel Pacing Threshold Amplitude: 0.75 V
Lead Channel Pacing Threshold Pulse Width: 0.5 ms
Lead Channel Sensing Intrinsic Amplitude: 11.3 mV
Lead Channel Setting Pacing Amplitude: 2.5 V
Lead Channel Setting Pacing Pulse Width: 0.5 ms
Lead Channel Setting Sensing Sensitivity: 0.5 mV
Pulse Gen Serial Number: 7310877

## 2018-07-29 NOTE — Progress Notes (Signed)
Remote ICD transmission.   

## 2018-08-19 ENCOUNTER — Other Ambulatory Visit: Payer: Self-pay

## 2018-08-19 ENCOUNTER — Encounter: Payer: Self-pay | Admitting: Family Medicine

## 2018-08-19 ENCOUNTER — Telehealth: Payer: Self-pay | Admitting: *Deleted

## 2018-08-19 ENCOUNTER — Ambulatory Visit (INDEPENDENT_AMBULATORY_CARE_PROVIDER_SITE_OTHER): Payer: Medicare Other | Admitting: Family Medicine

## 2018-08-19 VITALS — BP 120/66 | HR 69 | Wt 238.0 lb

## 2018-08-19 DIAGNOSIS — R6889 Other general symptoms and signs: Secondary | ICD-10-CM

## 2018-08-19 DIAGNOSIS — R509 Fever, unspecified: Secondary | ICD-10-CM | POA: Diagnosis not present

## 2018-08-19 DIAGNOSIS — R059 Cough, unspecified: Secondary | ICD-10-CM

## 2018-08-19 DIAGNOSIS — R05 Cough: Secondary | ICD-10-CM | POA: Diagnosis not present

## 2018-08-19 DIAGNOSIS — J209 Acute bronchitis, unspecified: Secondary | ICD-10-CM

## 2018-08-19 DIAGNOSIS — R5383 Other fatigue: Secondary | ICD-10-CM

## 2018-08-19 DIAGNOSIS — I255 Ischemic cardiomyopathy: Secondary | ICD-10-CM

## 2018-08-19 DIAGNOSIS — Z20822 Contact with and (suspected) exposure to covid-19: Secondary | ICD-10-CM

## 2018-08-19 MED ORDER — AZITHROMYCIN 250 MG PO TABS: ORAL_TABLET | ORAL | 0 refills | Status: DC

## 2018-08-19 NOTE — Telephone Encounter (Signed)
-----   Message from Emilee Hero, [place] sent at 08/19/2018  2:11 PM EDT ----- Regarding: COVID testing Male, 83 y.o., [birth year] MRN:  [id number]  Reason for testing: fever, cough, fatigue, high risk

## 2018-08-19 NOTE — Telephone Encounter (Signed)
Scheduled patient for COVID 19 test 08/23/2018 at 8:45 am at [facility].  Testing protocol reviewed with patient.

## 2018-08-19 NOTE — Progress Notes (Signed)
Virtual Visit via Video Note  I connected with pt on 08/19/18 at  2:00 PM EDT by a video enabled telemedicine application and verified that I am speaking with the correct person using two identifiers.  Location patient: home Location provider:work or home office Persons participating in the virtual visit: patient, provider  I discussed the limitations of evaluation and management by telemedicine and the availability of in person appointments. The patient expressed understanding and agreed to proceed.  Telemedicine visit is a necessity given the COVID-19 restrictions in place at the current time.  HPI: 83 y/o WM being seen today for fatigue, chest congestion.  No wheezing or SOB. Onset of fatigue/malaise chest congestion about 12 d/a. Had a subjective fever 1 day, then the next day 101. Most recent fever was 2 d/a.  HA across forehead and eyes. No nasal cong, ST, face pain, or runny nose.  No fever.  No SOB, no CP. His sx's are hanging on, not much improved.  Dayquil/cough cong med the last 4-5 days. Has been staying at home the last 12 days.  ROS: See pertinent positives and negatives per HPI.  Past Medical History:  Diagnosis Date  . AICD (automatic cardioverter/defibrillator) present   . Anemia, unspecified   . Arthritis   . BPH with obstruction/lower urinary tract symptoms    Dr. Gaynelle Arabian.  [name] CAD (coronary artery disease)    a. MI/CABG 1996. b. VF arrest 2008: cath with severe native 3v disease, 3/3 grafts widely patent, mild to moderate left ventricular systolic dysfunction secondary to anteroapical scar. EF 40-45%.  . Cardiac arrest [facility]) 2008   2008: aborted cardiac arrest 2008 (c/b encephalopathy, VDRF, aspiriation PNA, CHF, elevated LFTs), s/p St. Jude ICD placement.  . Chronic left ventricular systolic dysfunction    Ischemic CM  . Chronic renal insufficiency, stage 3 (moderate) (HCC)    GFR 40s-50s  . Chronic venous insufficiency   . Diabetes mellitus without  complication (HCC)    diet  controlled  . Disorder of bone and cartilage, unspecified   . Erectile dysfunction       "           "      "              "                  "    . Essential hypertension   . GERD (gastroesophageal reflux disease)   . Hx of adenomatous colonic polyps 2003, 2013   Pt declines any further colonoscopies as of 12/2016.  [name] Hypercholesterolemia   . Hypogonadism male    managed by [facility] (Dr. Gaynelle Arabian)  . Ischemic cardiomyopathy    a. EF 40-45% by cath 2008.  EF 39% by nuclear study 2016  . Meniscal injury Jan/Feb 2016   Sustained shoveling snow; repaired by Dr. Gladstone Lighter   . Myocardial infarction ([place])   . Obesity   . OSA on CPAP    sleep study 2008, severe OSA: CPAP 11 cm H2O  . Supraspinatus tendon tear 12/03/2016   Left shoulder.  Left shoulder open rotator cuff repair with graft and anchor (Left)--01/28/17.  [name] Urinary tract infection    diagnosed on 11/13/2015     Past Surgical History:  Procedure Laterality Date  . CARDIAC DEFIBRILLATOR PLACEMENT    . CARDIOVASCULAR STRESS TEST  04/2014   High risk study: large scar, EF 39%, +WM abnl  . COLONOSCOPY  06/10/11   One  diminutive polyp removed (tubular adenoma, no high grade dysplasia).  Repeat colonoscopy 5 yrs (Dr. Ardis Hughs).  . COLONOSCOPY W/ POLYPECTOMY  2003   Adenomatous; repeat 2006 was recommended but this was not done until 06/10/2011 (by different GI MD).  Pt declines any further colonoscopies as of 12/2016.  [name] CORONARY ARTERY BYPASS GRAFT  1996   3 vessel  . EP IMPLANTABLE DEVICE N/A 04/27/2015   Procedure: ICD Generator Changeout;  Surgeon: Deboraha Sprang, MD;  Location: [facility];  Service: Cardiovascular;  Laterality: N/A;  . KNEE CARTILAGE SURGERY  04/2014   Dr. Gladstone Lighter: torn meniscus repair  . PACEMAKER PLACEMENT  07/2006   St Jude current VRRF--generator change 03/2015  . POLYPECTOMY    . right carpal tunnel release     . SHOULDER OPEN ROTATOR CUFF REPAIR Left 01/28/2017    Procedure: Left shoulder open rotator cuff repair with graft and anchor;  Surgeon: Latanya Maudlin, MD;  Location: [facility];  Service: Orthopedics;  Laterality: Left;  Interscalene Block  . TOTAL KNEE ARTHROPLASTY Left 11/21/2015   Procedure: LEFT TOTAL KNEE ARTHROPLASTY;  Surgeon: Latanya Maudlin, MD;  Location: [facility];  Service: Orthopedics;  Laterality: Left;    Family History  Problem Relation Age of Onset  . Heart disease Mother   . Stroke Mother   . Heart disease Father   . Colon cancer Neg Hx   . Esophageal cancer Neg Hx   . Stomach cancer Neg Hx   . Rectal cancer Neg Hx      Current Outpatient Medications:  .  aspirin EC 81 MG tablet, Take 81 mg by mouth daily., Disp: , Rfl:  .  atorvastatin (LIPITOR) 20 MG tablet, Take 1 tablet (20 mg total) by mouth daily., Disp: 90 tablet, Rfl: 1 .  b complex vitamins tablet, Take 1 tablet by mouth daily., Disp: , Rfl:  .  CALCIUM PO, Take 1 tablet by mouth daily., Disp: , Rfl:  .  lisinopril (ZESTRIL) 5 MG tablet, Take 1 tablet (5 mg total) by mouth daily., Disp: 90 tablet, Rfl: 1 .  Menthol, Topical Analgesic, (BIOFREEZE EX), Apply 1 application topically daily as needed (for pain). , Disp: , Rfl:  .  metoprolol tartrate (LOPRESSOR) 25 MG tablet, Take 1 tablet (25 mg total) by mouth 2 (two) times daily., Disp: 180 tablet, Rfl: 1 .  Multiple Vitamin (MULTIVITAMIN) tablet, Take 1 tablet by mouth daily., Disp: , Rfl:  .  NON FORMULARY, Take 1 tablet by mouth daily. OPC 3.  antioxidant protection, Disp: , Rfl:  .  pantoprazole (PROTONIX) 40 MG tablet, Take 1 tablet (40 mg total) by mouth daily., Disp: 90 tablet, Rfl: 1  EXAM:  VITALS per patient if applicable: BP 299/24 (BP Location: Left Arm, Patient Position: Sitting, Cuff Size: Large)   Pulse 69   Wt 238 lb (108 kg)   BMI 32.28 kg/m    GENERAL: alert, oriented, appears well and in no acute distress  HEENT: atraumatic, conjunttiva clear, no obvious abnormalities on inspection of  external nose and ears  NECK: normal movements of the head and neck  LUNGS: on inspection no signs of respiratory distress, breathing rate appears normal, no obvious gross SOB, gasping or wheezing  CV: no obvious cyanosis  MS: moves all visible extremities without noticeable abnormality  PSYCH/NEURO: pleasant and cooperative, no obvious depression or anxiety, speech and thought processing grossly intact  LABS: none today    Chemistry      Component Value Date/Time  NA 138 06/14/2018 0846   K 4.6 06/14/2018 0846   CL 102 06/14/2018 0846   CO2 26 06/14/2018 0846   BUN 20 06/14/2018 0846   CREATININE 1.47 06/14/2018 0846   CREATININE 1.34 (H) 04/23/2015 1512      Component Value Date/Time   CALCIUM 9.2 06/14/2018 0846   ALKPHOS 57 06/14/2018 0846   AST 20 06/14/2018 0846   ALT 24 06/14/2018 0846   BILITOT 0.9 06/14/2018 0846     Lab Results  Component Value Date   WBC 6.7 01/26/2017   HGB 13.0 01/26/2017   HCT 38.0 (L) 01/26/2017   MCV 92.2 01/26/2017   PLT 222 01/26/2017   Lab Results  Component Value Date   TSH 4.20 03/19/2010   Lab Results  Component Value Date   HGBA1C 7.4 (H) 06/14/2018    ASSESSMENT AND PLAN:  Discussed the following assessment and plan:  1) Acute bronchitis/cough, with fever and fatigue/malaise. He is high risk for complications if he contracts covid 19. Covid 19 testing will be done via [facility] protocol. Zpack eRx'd. Recommended mucinex DM. No antipyretics UNLESS he has temp >100.4.   I discussed the assessment and treatment plan with the patient. The patient was provided an opportunity to ask questions and all were answered. The patient agreed with the plan and demonstrated an understanding of the instructions.   The patient was advised to call back or seek an in-person evaluation if the symptoms worsen or if the condition fails to improve as anticipated.  F/u: To be determined based on results of pending workup.   Signed:  Crissie Sickles, MD           08/19/2018

## 2018-08-23 ENCOUNTER — Other Ambulatory Visit: Payer: Medicare Other

## 2018-08-23 DIAGNOSIS — Z20822 Contact with and (suspected) exposure to covid-19: Secondary | ICD-10-CM

## 2018-08-23 DIAGNOSIS — R6889 Other general symptoms and signs: Secondary | ICD-10-CM | POA: Diagnosis not present

## 2018-08-28 LAB — NOVEL CORONAVIRUS, NAA: SARS-CoV-2, NAA: NOT DETECTED

## 2018-09-20 ENCOUNTER — Telehealth: Payer: Self-pay | Admitting: Family Medicine

## 2018-09-20 NOTE — Telephone Encounter (Signed)
They need to send a form for this so I know exactly what I'm authorizing.

## 2018-09-20 NOTE — Telephone Encounter (Signed)
Tried to call [employer] [facility] with no luck and on hold for 10 minutes. Will try calling again tomorrow.

## 2018-09-20 NOTE — Telephone Encounter (Signed)
Copied from [place] [id number]. Topic: Quick Communication - Rx Refill/Question >> Sep 20, 2018 10:51 AM Erick Blinks wrote: Authorization needed for new CPAP machine for [place] [phone] Best contact: [phone]

## 2018-09-20 NOTE — Telephone Encounter (Signed)
Please advise, thanks.

## 2018-09-22 NOTE — Telephone Encounter (Signed)
Called [employer] and spoke with Judson Roch, she is going to send over a blank form with the areas highlighted that would need completing. She advised that patients account is still being handled by [facility].

## 2018-09-23 NOTE — Telephone Encounter (Signed)
Received form for pt. PCP will review and sign, if appropriate.

## 2018-09-24 ENCOUNTER — Telehealth: Payer: Self-pay | Admitting: Family Medicine

## 2018-09-24 NOTE — Telephone Encounter (Signed)
A user error has taken place: encounter opened in error, closed for administrative reasons.

## 2018-09-28 NOTE — Telephone Encounter (Signed)
Pt needs to get form filled out by his pulm/sleep MD.

## 2018-10-22 ENCOUNTER — Ambulatory Visit (INDEPENDENT_AMBULATORY_CARE_PROVIDER_SITE_OTHER): Payer: Medicare Other | Admitting: *Deleted

## 2018-10-22 DIAGNOSIS — I4901 Ventricular fibrillation: Secondary | ICD-10-CM | POA: Diagnosis not present

## 2018-10-22 LAB — CUP PACEART REMOTE DEVICE CHECK
Battery Remaining Longevity: 72 mo
Battery Remaining Percentage: 70 %
Battery Voltage: 2.96 V
Brady Statistic RV Percent Paced: 1 %
Date Time Interrogation Session: 20200903060015
HighPow Impedance: 54 Ohm
HighPow Impedance: 54 Ohm
Implantable Lead Implant Date: 20080624
Implantable Lead Location: 753860
Implantable Lead Model: 7121
Implantable Pulse Generator Implant Date: 20170310
Lead Channel Impedance Value: 460 Ohm
Lead Channel Pacing Threshold Amplitude: 0.75 V
Lead Channel Pacing Threshold Pulse Width: 0.5 ms
Lead Channel Sensing Intrinsic Amplitude: 11.3 mV
Lead Channel Setting Pacing Amplitude: 2.5 V
Lead Channel Setting Pacing Pulse Width: 0.5 ms
Lead Channel Setting Sensing Sensitivity: 0.5 mV
Pulse Gen Serial Number: 7310877

## 2018-11-03 ENCOUNTER — Encounter: Payer: Self-pay | Admitting: Cardiology

## 2018-11-03 NOTE — Progress Notes (Signed)
Remote ICD transmission.   

## 2018-11-18 ENCOUNTER — Telehealth: Payer: Self-pay | Admitting: Family Medicine

## 2018-11-18 DIAGNOSIS — G4733 Obstructive sleep apnea (adult) (pediatric): Secondary | ICD-10-CM

## 2018-11-18 NOTE — Telephone Encounter (Signed)
Patient was advised based on 09/20/18 phone call regarding CPAP machine for [place] that his pulm/sleep MD would need to complete the forms.     Patient's last visit with Dr.Sood was 01/12/2007.  [place] to find out status of order for machine, spoke with Tisa that informed me that no documents have yet been received. Forms will be faxed to our office. It will take about 1 week for him to receive supplies and equipment once completed.  Please advise if a new referral is needed.

## 2018-11-18 NOTE — Telephone Encounter (Signed)
Calling back regarding his CPAP machine. Please call

## 2018-11-18 NOTE — Telephone Encounter (Signed)
Yes, he'll need pulmonology to do this paperwork, etc. He needs appropriate f/u for his OSA -->I have just ordered a referral to Dr. Halford Chessman.

## 2018-11-18 NOTE — Telephone Encounter (Signed)
Pt was advised someone will contact with scheduling details regarding referral and paperwork will be faxed to Dr.Sood to complete once received to our office.

## 2018-12-02 ENCOUNTER — Other Ambulatory Visit: Payer: Self-pay | Admitting: Family Medicine

## 2018-12-09 ENCOUNTER — Other Ambulatory Visit: Payer: Self-pay | Admitting: Family Medicine

## 2018-12-17 DIAGNOSIS — Z23 Encounter for immunization: Secondary | ICD-10-CM | POA: Diagnosis not present

## 2018-12-30 ENCOUNTER — Other Ambulatory Visit: Payer: Self-pay

## 2018-12-30 ENCOUNTER — Ambulatory Visit (INDEPENDENT_AMBULATORY_CARE_PROVIDER_SITE_OTHER): Payer: Medicare Other | Admitting: Internal Medicine

## 2018-12-30 ENCOUNTER — Encounter: Payer: Self-pay | Admitting: Internal Medicine

## 2018-12-30 VITALS — BP 118/74 | HR 71 | Temp 98.0°F | Ht 72.0 in | Wt 254.0 lb

## 2018-12-30 DIAGNOSIS — I2584 Coronary atherosclerosis due to calcified coronary lesion: Secondary | ICD-10-CM

## 2018-12-30 DIAGNOSIS — I2510 Atherosclerotic heart disease of native coronary artery without angina pectoris: Secondary | ICD-10-CM

## 2018-12-30 DIAGNOSIS — Z9989 Dependence on other enabling machines and devices: Secondary | ICD-10-CM

## 2018-12-30 DIAGNOSIS — G4733 Obstructive sleep apnea (adult) (pediatric): Secondary | ICD-10-CM

## 2018-12-30 DIAGNOSIS — I255 Ischemic cardiomyopathy: Secondary | ICD-10-CM | POA: Diagnosis not present

## 2018-12-30 NOTE — Patient Instructions (Signed)
Order- DME [facility]-  Replace old CPAP machine, auto 5-15, mask of choice, humidifier, supplies, AirView/ card Please provide download in 1 week  Please call us if we can help

## 2018-12-30 NOTE — Progress Notes (Signed)
12/30/2018- 82yoM former smoker for sleep evaluation. Patient worked with Dr Halford Chessman in 2008. NPSG 10/12/06- AHI 32, desaturation to 77% Medical problem list includes CAD,  Ischemic cardiomyopathy/ AICD, HBP, Venous insufficiency, GERD, DM, CRF III,  CPAP  [employer].  He has been using loaner machine till this appointment. Able to use all night, every night and sleeps much better with CPAP. Thinks pressure 11. Old machine worn out.  No download. No sleep meds. Epworth score on CPAP 1 Body weight today  254 lbs ENT+ tonsillectomy, denies lung disease. Heart problems stable.  Prior to Admission medications   Medication Sig Start Date End Date Taking? Authorizing Provider  aspirin EC 81 MG tablet Take 81 mg by mouth daily.   Yes [provider]  atorvastatin (LIPITOR) 20 MG tablet TAKE ONE TABLET BY MOUTH DAILY 12/02/18  Yes McGowen, Adrian Blackwater, MD  b complex vitamins tablet Take 1 tablet by mouth daily.   Yes [provider]  CALCIUM PO Take 1 tablet by mouth daily.   Yes [provider]  lisinopril (ZESTRIL) 5 MG tablet TAKE ONE TABLET BY MOUTH DAILY 12/02/18  Yes McGowen, Adrian Blackwater, MD  Menthol, Topical Analgesic, (BIOFREEZE EX) Apply 1 application topically daily as needed (for pain).    Yes [provider]  metoprolol tartrate (LOPRESSOR) 25 MG tablet TAKE ONE TABLET BY MOUTH TWO TIMES A DAY 12/02/18  Yes McGowen, Adrian Blackwater, MD  Multiple Vitamin (MULTIVITAMIN) tablet Take 1 tablet by mouth daily.   Yes [provider]  NON FORMULARY Take 1 tablet by mouth daily. OPC 3.  antioxidant protection   Yes [provider]  pantoprazole (PROTONIX) 40 MG tablet TAKE ONE TABLET BY MOUTH DAILY 12/02/18  Yes McGowen, Adrian Blackwater, MD  vitamin C (ASCORBIC ACID) 500 MG tablet Take 500 mg by mouth daily.   Yes [provider]  Zinc Sulfate (ZINC 15 PO) Take by mouth.   Yes [provider]  azithromycin (ZITHROMAX) 250 MG tablet 2 tabs po qd x 1d, then  1 tab po qd x 4d Patient not taking: Reported on 12/30/2018 08/19/18   Tammi Sou, MD  Calcium Carbonate (CALCIUM 500 PO) Take 1 tablet by mouth daily.  04/11/15  [provider]   Past Medical History:  Diagnosis Date  . AICD (automatic cardioverter/defibrillator) present   . Anemia, unspecified   . Arthritis   . BPH with obstruction/lower urinary tract symptoms    Dr. Gaynelle Arabian.  [name] CAD (coronary artery disease)    a. MI/CABG 1996. b. VF arrest 2008: cath with severe native 3v disease, 3/3 grafts widely patent, mild to moderate left ventricular systolic dysfunction secondary to anteroapical scar. EF 40-45%.  . Cardiac arrest [facility]) 2008   2008: aborted cardiac arrest 2008 (c/b encephalopathy, VDRF, aspiriation PNA, CHF, elevated LFTs), s/p St. Jude ICD placement.  . Chronic left ventricular systolic dysfunction    Ischemic CM  . Chronic renal insufficiency, stage 3 (moderate)    GFR 40s-50s  . Chronic venous insufficiency   . Diabetes mellitus without complication (HCC)    diet  controlled  . Disorder of bone and cartilage, unspecified   . Erectile dysfunction       "           "      "              "                  "    .  Essential hypertension   . GERD (gastroesophageal reflux disease)   . Hx of adenomatous colonic polyps 2003, 2013   Pt declines any further colonoscopies as of 12/2016.  [name] Hypercholesterolemia   . Hypogonadism male    managed by [facility] (Dr. Gaynelle Arabian)  . Ischemic cardiomyopathy    a. EF 40-45% by cath 2008.  EF 39% by nuclear study 2016  . Meniscal injury Jan/Feb 2016   Sustained shoveling snow; repaired by Dr. Gladstone Lighter   . Myocardial infarction ([place])   . Obesity   . OSA on CPAP    sleep study 2008, severe OSA: CPAP 11 cm H2O  . Supraspinatus tendon tear 12/03/2016   Left shoulder.  Left shoulder open rotator cuff repair with graft and anchor (Left)--01/28/17.  [name] Urinary tract infection    diagnosed on 11/13/2015    Past  Surgical History:  Procedure Laterality Date  . CARDIAC DEFIBRILLATOR PLACEMENT    . CARDIOVASCULAR STRESS TEST  04/2014   High risk study: large scar, EF 39%, +WM abnl  . COLONOSCOPY  06/10/11   One diminutive polyp removed (tubular adenoma, no high grade dysplasia).  Repeat colonoscopy 5 yrs (Dr. Ardis Hughs).  . COLONOSCOPY W/ POLYPECTOMY  2003   Adenomatous; repeat 2006 was recommended but this was not done until 06/10/2011 (by different GI MD).  Pt declines any further colonoscopies as of 12/2016.  [name] CORONARY ARTERY BYPASS GRAFT  1996   3 vessel  . EP IMPLANTABLE DEVICE N/A 04/27/2015   Procedure: ICD Generator Changeout;  Surgeon: Deboraha Sprang, MD;  Location: [place];  Service: Cardiovascular;  Laterality: N/A;  . KNEE CARTILAGE SURGERY  04/2014   Dr. Gladstone Lighter: torn meniscus repair  . PACEMAKER PLACEMENT  07/2006   St Jude current VRRF--generator change 03/2015  . POLYPECTOMY    . right carpal tunnel release     . SHOULDER OPEN ROTATOR CUFF REPAIR Left 01/28/2017   Procedure: Left shoulder open rotator cuff repair with graft and anchor;  Surgeon: Latanya Maudlin, MD;  Location: [facility];  Service: Orthopedics;  Laterality: Left;  Interscalene Block  . TOTAL KNEE ARTHROPLASTY Left 11/21/2015   Procedure: LEFT TOTAL KNEE ARTHROPLASTY;  Surgeon: Latanya Maudlin, MD;  Location: [facility];  Service: Orthopedics;  Laterality: Left;   Family History  Problem Relation Age of Onset  . Heart disease Mother   . Stroke Mother   . Heart disease Father   . Colon cancer Neg Hx   . Esophageal cancer Neg Hx   . Stomach cancer Neg Hx   . Rectal cancer Neg Hx    Social History   Socioeconomic History  . Marital status: Married    Spouse name: Not on file  . Number of children: Not on file  . Years of education: Not on file  . Highest education level: Not on file  Occupational History  . Not on file  Social Needs  . Financial resource strain: Not on file  . Food insecurity    Worry: Not on  file    Inability: Not on file  . Transportation needs    Medical: Not on file    Non-medical: Not on file  Tobacco Use  . Smoking status: Former Smoker    Types: Cigarettes    Quit date: 02/17/1970    Years since quitting: 48.8  . Smokeless tobacco: Never Used  Substance and Sexual Activity  . Alcohol use: Yes    Alcohol/week: 7.0 standard drinks    Types:  7 Glasses of wine per week    Comment: drinks wine daily   . Drug use: No  . Sexual activity: Not on file  Lifestyle  . Physical activity    Days per week: Not on file    Minutes per session: Not on file  . Stress: Not on file  Relationships  . Social Herbalist on phone: Not on file    Gets together: Not on file    Attends religious service: Not on file    Active member of club or organization: Not on file    Attends meetings of clubs or organizations: Not on file    Relationship status: Not on file  . Intimate partner violence    Fear of current or ex partner: Not on file    Emotionally abused: Not on file    Physically abused: Not on file    Forced sexual activity: Not on file  Other Topics Concern  . Not on file  Social History Narrative   Married, 2 daughters, 4 grandchildren.   Retired Producer, television/film/video for [employer] [phone]).   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   Not sedentary, but has no formal exercise regimen.   Ex-smoker, quit in his 76s.  Drinks one glass of wine daily.   No drug use.   ROS-see HPI  + = positive Constitutional:    weight loss, night sweats, fevers, chills, fatigue, lassitude. HEENT:    headaches, difficulty swallowing, tooth/dental problems, sore throat,       sneezing, itching, ear ache, nasal congestion, post nasal drip, snoring CV:    chest pain, orthopnea, PND, swelling in lower extremities, anasarca,                                  dizziness, palpitations Resp:   shortness of breath with exertion or at rest.                productive cough,   non-productive cough,  coughing up of blood.              change in color of mucus.  wheezing.   Skin:    rash or lesions. GI:  No-   heartburn, indigestion, abdominal pain, nausea, vomiting, diarrhea,                 change in bowel habits, loss of appetite GU: dysuria, change in color of urine, no urgency or frequency.   flank pain. MS:   joint pain, stiffness, decreased range of motion, back pain. Neuro-     nothing unusual Psych:  change in mood or affect.  depression or anxiety.   memory loss.  OBJ- Physical Exam General- Alert, Oriented, Affect-appropriate, Distress- none acute, +Obese Skin- rash-none, lesions- none, excoriation- none Lymphadenopathy- none Head- atraumatic            Eyes- Gross vision intact, PERRLA, conjunctivae and secretions clear            Ears- Hearing, canals-normal            Nose- Clear, no-Septal dev, mucus, polyps, erosion, perforation             Throat- Mallampati II , mucosa clear , drainage- none, tonsils- atrophic,  + teeth Neck- flexible , trachea midline, no stridor , thyroid nl, carotid no bruit Chest - symmetrical excursion , unlabored  Heart/CV- RRR , no murmur , no gallop  , no rub, nl s1 s2                           - JVD- none , edema- none, stasis changes- none, varices- none           Lung- clear to P&A, wheeze- none, cough- none , dullness-none, rub- none           Chest wall-  Abd-  Br/ Gen/ Rectal- Not done, not indicated Extrem- cyanosis- none, clubbing, none, atrophy- none, strength- nl Neuro- grossly intact to observation

## 2018-12-30 NOTE — Assessment & Plan Note (Addendum)
Has continued to benefit and is very compliant with CPAP. Needs old machine replaced.  Plan-  Replace old machine, change to auto 5-15 and verify with download.

## 2018-12-30 NOTE — Assessment & Plan Note (Signed)
Complications managed by cardiology. Denies recent issues.

## 2019-01-03 ENCOUNTER — Other Ambulatory Visit: Payer: Self-pay | Admitting: Family Medicine

## 2019-01-05 ENCOUNTER — Telehealth: Payer: Self-pay | Admitting: Internal Medicine

## 2019-01-05 NOTE — Telephone Encounter (Signed)
Spoke with the pt  He states that he is needing to know if he needs to bring the new CPAP chip for [place] or the one that is in the CPAP that he has been using  I advised we need to most updated info so bring the latest CPAP chip  Nothing further needed

## 2019-01-10 ENCOUNTER — Other Ambulatory Visit: Payer: Self-pay | Admitting: Family Medicine

## 2019-01-17 ENCOUNTER — Telehealth: Payer: Self-pay | Admitting: Family Medicine

## 2019-01-17 MED ORDER — ATORVASTATIN CALCIUM 20 MG PO TABS: 20.0000 mg | ORAL_TABLET | Freq: Every day | ORAL | 0 refills | Status: DC

## 2019-01-17 MED ORDER — PANTOPRAZOLE SODIUM 40 MG PO TBEC: 40.0000 mg | DELAYED_RELEASE_TABLET | Freq: Every day | ORAL | 0 refills | Status: DC

## 2019-01-17 MED ORDER — METOPROLOL TARTRATE 25 MG PO TABS: 25.0000 mg | ORAL_TABLET | Freq: Two times a day (BID) | ORAL | 0 refills | Status: DC

## 2019-01-17 MED ORDER — LISINOPRIL 5 MG PO TABS: 5.0000 mg | ORAL_TABLET | Freq: Every day | ORAL | 0 refills | Status: DC

## 2019-01-17 NOTE — Telephone Encounter (Signed)
Requested to speak only to Dr. Anitra Lauth of his nurse.    Patient would not go into detail with me.  Please call patient at [phone].  Thank you

## 2019-01-17 NOTE — Telephone Encounter (Signed)
Spoke with patient, he is confused regarding his medications. He was only sent 30 d/s due to last f/u visit being 06/08/18. Advised to f/u 6 months for RCI. Rx sent for 90 d/s instead. Next f/u has been scheduled.

## 2019-01-17 NOTE — Addendum Note (Signed)
Addended by: Deveron Furlong D on: 01/17/2019 01:52 PM   Modules accepted: Orders

## 2019-01-20 ENCOUNTER — Other Ambulatory Visit: Payer: Self-pay

## 2019-01-20 ENCOUNTER — Encounter: Payer: Self-pay | Admitting: Family Medicine

## 2019-01-20 ENCOUNTER — Ambulatory Visit (INDEPENDENT_AMBULATORY_CARE_PROVIDER_SITE_OTHER): Payer: Medicare Other | Admitting: Family Medicine

## 2019-01-20 VITALS — BP 118/74 | HR 71 | Temp 98.0°F

## 2019-01-20 DIAGNOSIS — I255 Ischemic cardiomyopathy: Secondary | ICD-10-CM | POA: Diagnosis not present

## 2019-01-20 DIAGNOSIS — I2510 Atherosclerotic heart disease of native coronary artery without angina pectoris: Secondary | ICD-10-CM

## 2019-01-20 DIAGNOSIS — E7800 Pure hypercholesterolemia, unspecified: Secondary | ICD-10-CM | POA: Diagnosis not present

## 2019-01-20 DIAGNOSIS — N183 Chronic kidney disease, stage 3 unspecified: Secondary | ICD-10-CM

## 2019-01-20 DIAGNOSIS — E119 Type 2 diabetes mellitus without complications: Secondary | ICD-10-CM | POA: Diagnosis not present

## 2019-01-20 DIAGNOSIS — I10 Essential (primary) hypertension: Secondary | ICD-10-CM | POA: Diagnosis not present

## 2019-01-20 MED ORDER — METOPROLOL TARTRATE 25 MG PO TABS: 25.0000 mg | ORAL_TABLET | Freq: Two times a day (BID) | ORAL | 3 refills | Status: DC

## 2019-01-20 MED ORDER — ATORVASTATIN CALCIUM 20 MG PO TABS: 20.0000 mg | ORAL_TABLET | Freq: Every day | ORAL | 3 refills | Status: DC

## 2019-01-20 MED ORDER — LISINOPRIL 5 MG PO TABS: 5.0000 mg | ORAL_TABLET | Freq: Every day | ORAL | 3 refills | Status: DC

## 2019-01-20 MED ORDER — PANTOPRAZOLE SODIUM 40 MG PO TBEC: 40.0000 mg | DELAYED_RELEASE_TABLET | Freq: Every day | ORAL | 3 refills | Status: DC

## 2019-01-20 NOTE — Progress Notes (Signed)
Virtual Visit via Video Note  I connected with pt on 01/20/19 at  1:30 PM EST by a video enabled telemedicine application and verified that I am speaking with the correct person using two identifiers.  Location patient: home Location provider:work or home office Persons participating in the virtual visit: patient, provider  I discussed the limitations of evaluation and management by telemedicine and the availability of in person appointments. The patient expressed understanding and agreed to proceed.  Telemedicine visit is a necessity given the COVID-19 restrictions in place at the current time.  HPI: 83 y/o WM being seen today for F/U DM 2 (diet controlled), HTN, HLD, CRI stage III.  DM: A1c was up to 7.4% last visit and he chose to NOT start medication.  HTN: home bp control good, 120/70 typically.  HLD: tolerating cholesterol meds, compliant. Decent diet.  CRI III: no NSAIDs. Hydrates well.  CAD: he is active and has no CP, SOB, DOE, palpitations, dizziness, or LE swelling.  ROS: no fevers, no wheezing, no cough, no HAs, no rashes, no melena/hematochezia.  No polyuria or polydipsia.  No myalgias or arthralgias.   Past Medical History:  Diagnosis Date  . AICD (automatic cardioverter/defibrillator) present   . Anemia, unspecified   . Arthritis   . BPH with obstruction/lower urinary tract symptoms    Dr. Gaynelle Arabian.  [name] CAD (coronary artery disease)    a. MI/CABG 1996. b. VF arrest 2008: cath with severe native 3v disease, 3/3 grafts widely patent, mild to moderate left ventricular systolic dysfunction secondary to anteroapical scar. EF 40-45%.  . Cardiac arrest [facility]) 2008   2008: aborted cardiac arrest 2008 (c/b encephalopathy, VDRF, aspiriation PNA, CHF, elevated LFTs), s/p St. Jude ICD placement.  . Chronic left ventricular systolic dysfunction    Ischemic CM  . Chronic renal insufficiency, stage 3 (moderate)    GFR 40s-50s  . Chronic venous insufficiency   . Diabetes  mellitus without complication (HCC)    diet  controlled  . Disorder of bone and cartilage, unspecified   . Erectile dysfunction       "           "      "              "                  "    . Essential hypertension   . GERD (gastroesophageal reflux disease)   . Hx of adenomatous colonic polyps 2003, 2013   Pt declines any further colonoscopies as of 12/2016.  [name] Hypercholesterolemia   . Hypogonadism male    managed by [facility] (Dr. Gaynelle Arabian)  . Ischemic cardiomyopathy    a. EF 40-45% by cath 2008.  EF 39% by nuclear study 2016  . Meniscal injury Jan/Feb 2016   Sustained shoveling snow; repaired by Dr. Gladstone Lighter   . Myocardial infarction ([place])   . Obesity   . OSA on CPAP    sleep study 2008, severe OSA: CPAP 11 cm H2O  . Supraspinatus tendon tear 12/03/2016   Left shoulder.  Left shoulder open rotator cuff repair with graft and anchor (Left)--01/28/17.  [name] Urinary tract infection    diagnosed on 11/13/2015     Past Surgical History:  Procedure Laterality Date  . CARDIAC DEFIBRILLATOR PLACEMENT    . CARDIOVASCULAR STRESS TEST  04/2014   High risk study: large scar, EF 39%, +WM abnl  . COLONOSCOPY  06/10/11   One diminutive  polyp removed (tubular adenoma, no high grade dysplasia).  Repeat colonoscopy 5 yrs (Dr. Ardis Hughs).  . COLONOSCOPY W/ POLYPECTOMY  2003   Adenomatous; repeat 2006 was recommended but this was not done until 06/10/2011 (by different GI MD).  Pt declines any further colonoscopies as of 12/2016.  [name] CORONARY ARTERY BYPASS GRAFT  1996   3 vessel  . EP IMPLANTABLE DEVICE N/A 04/27/2015   Procedure: ICD Generator Changeout;  Surgeon: Deboraha Sprang, MD;  Location: [facility];  Service: Cardiovascular;  Laterality: N/A;  . KNEE CARTILAGE SURGERY  04/2014   Dr. Gladstone Lighter: torn meniscus repair  . PACEMAKER PLACEMENT  07/2006   St Jude current VRRF--generator change 03/2015  . POLYPECTOMY    . right carpal tunnel release     . SHOULDER OPEN ROTATOR CUFF REPAIR  Left 01/28/2017   Procedure: Left shoulder open rotator cuff repair with graft and anchor;  Surgeon: Latanya Maudlin, MD;  Location: [facility];  Service: Orthopedics;  Laterality: Left;  Interscalene Block  . TOTAL KNEE ARTHROPLASTY Left 11/21/2015   Procedure: LEFT TOTAL KNEE ARTHROPLASTY;  Surgeon: Latanya Maudlin, MD;  Location: [facility];  Service: Orthopedics;  Laterality: Left;    Family History  Problem Relation Age of Onset  . Heart disease Mother   . Stroke Mother   . Heart disease Father   . Colon cancer Neg Hx   . Esophageal cancer Neg Hx   . Stomach cancer Neg Hx   . Rectal cancer Neg Hx    Social History   Socioeconomic History  . Marital status: Married    Spouse name: Not on file  . Number of children: Not on file  . Years of education: Not on file  . Highest education level: Not on file  Occupational History  . Not on file  Social Needs  . Financial resource strain: Not on file  . Food insecurity    Worry: Not on file    Inability: Not on file  . Transportation needs    Medical: Not on file    Non-medical: Not on file  Tobacco Use  . Smoking status: Former Smoker    Types: Cigarettes    Quit date: 02/17/1970    Years since quitting: 48.9  . Smokeless tobacco: Never Used  Substance and Sexual Activity  . Alcohol use: Yes    Alcohol/week: 7.0 standard drinks    Types: 7 Glasses of wine per week    Comment: drinks wine daily   . Drug use: No  . Sexual activity: Not on file  Lifestyle  . Physical activity    Days per week: Not on file    Minutes per session: Not on file  . Stress: Not on file  Relationships  . Social Herbalist on phone: Not on file    Gets together: Not on file    Attends religious service: Not on file    Active member of club or organization: Not on file    Attends meetings of clubs or organizations: Not on file    Relationship status: Not on file  Other Topics Concern  . Not on file  Social History Narrative   Married, 2  daughters, 4 grandchildren.   Retired Producer, television/film/video for [employer] [phone]).   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   Not sedentary, but has no formal exercise regimen.   Ex-smoker, quit in his 32s.  Drinks one glass of wine daily.   No drug use.  Current Outpatient Medications:  .  aspirin EC 81 MG tablet, Take 81 mg by mouth daily., Disp: , Rfl:  .  atorvastatin (LIPITOR) 20 MG tablet, Take 1 tablet (20 mg total) by mouth daily., Disp: 90 tablet, Rfl: 0 .  b complex vitamins tablet, Take 1 tablet by mouth daily., Disp: , Rfl:  .  CALCIUM PO, Take 1 tablet by mouth daily., Disp: , Rfl:  .  lisinopril (ZESTRIL) 5 MG tablet, Take 1 tablet (5 mg total) by mouth daily., Disp: 90 tablet, Rfl: 0 .  Menthol, Topical Analgesic, (BIOFREEZE EX), Apply 1 application topically daily as needed (for pain). , Disp: , Rfl:  .  metoprolol tartrate (LOPRESSOR) 25 MG tablet, Take 1 tablet (25 mg total) by mouth 2 (two) times daily., Disp: 90 tablet, Rfl: 0 .  Multiple Vitamin (MULTIVITAMIN) tablet, Take 1 tablet by mouth daily., Disp: , Rfl:  .  NON FORMULARY, Take 1 tablet by mouth daily. OPC 3.  antioxidant protection, Disp: , Rfl:  .  pantoprazole (PROTONIX) 40 MG tablet, Take 1 tablet (40 mg total) by mouth daily., Disp: 90 tablet, Rfl: 0 .  vitamin C (ASCORBIC ACID) 500 MG tablet, Take 500 mg by mouth daily., Disp: , Rfl:  .  Zinc Sulfate (ZINC 15 PO), Take by mouth., Disp: , Rfl:   EXAM:  VITALS per patient if applicable: BP XX123456 (BP Location: Left Arm, Patient Position: Sitting, Cuff Size: Large)   Pulse 71   Temp 98 F (36.7 C) (Oral)   SpO2 93%    GENERAL: alert, oriented, appears well and in no acute distress  HEENT: atraumatic, conjunttiva clear, no obvious abnormalities on inspection of external nose and ears  NECK: normal movements of the head and neck  LUNGS: on inspection no signs of respiratory distress, breathing rate appears normal, no obvious gross SOB, gasping or  wheezing  CV: no obvious cyanosis  MS: moves all visible extremities without noticeable abnormality  PSYCH/NEURO: pleasant and cooperative, no obvious depression or anxiety, speech and thought processing grossly intact  LABS: none today  Lab Results  Component Value Date   TSH 4.20 03/19/2010   Lab Results  Component Value Date   WBC 6.7 01/26/2017   HGB 13.0 01/26/2017   HCT 38.0 (L) 01/26/2017   MCV 92.2 01/26/2017   PLT 222 01/26/2017   Lab Results  Component Value Date   CREATININE 1.47 06/14/2018   BUN 20 06/14/2018   NA 138 06/14/2018   K 4.6 06/14/2018   CL 102 06/14/2018   CO2 26 06/14/2018   Lab Results  Component Value Date   ALT 24 06/14/2018   AST 20 06/14/2018   ALKPHOS 57 06/14/2018   BILITOT 0.9 06/14/2018   Lab Results  Component Value Date   CHOL 117 06/14/2018   Lab Results  Component Value Date   HDL 38.50 (L) 06/14/2018   Lab Results  Component Value Date   LDLCALC 60 06/14/2018   Lab Results  Component Value Date   TRIG 92.0 06/14/2018   Lab Results  Component Value Date   CHOLHDL 3 06/14/2018   Lab Results  Component Value Date   PSA 0.34 12/18/2011   PSA 0.43 03/24/2011   PSA 0.34 03/19/2010   Lab Results  Component Value Date   HGBA1C 7.4 (H) 06/14/2018    ASSESSMENT AND PLAN:  Discussed the following assessment and plan:  1) DM 2, diet controlled. No home monitoring. He prefers to wait until f/u in  6 mo to repeat labs->covid 19 pandemic precautions->I'm ok with this.  2) HLD: tolerating meds. Diet is fairly good. As per #1 above, wait to repeat labs 6 mo.  3) HTN: great control. Labs in 6 mo.  4) CRI III: he avoids NSAIDs.  He hydrates well. Cr/GFR has been stable the last 2 + yrs and I feel it is fine to wait 6 mo to repeat labs, as per his preference expressed today.  5) CAD: asymptomatic.  Continue ASA, statin, BB.  -we discussed possible serious and likely etiologies, options for evaluation and  workup, limitations of telemedicine visit vs in person visit, treatment, treatment risks and precautions. Pt prefers to treat via telemedicine empirically rather then risking or undertaking an in person visit at this moment. Patient agrees to seek prompt in person care if worsening, new symptoms arise, or if is not improving with treatment.   I discussed the assessment and treatment plan with the patient. The patient was provided an opportunity to ask questions and all were answered. The patient agreed with the plan and demonstrated an understanding of the instructions.   The patient was advised to call back or seek an in-person evaluation if the symptoms worsen or if the condition fails to improve as anticipated.  F/u: 6 mo cpe  Signed:  Crissie Sickles, MD           01/20/2019

## 2019-01-21 ENCOUNTER — Ambulatory Visit (INDEPENDENT_AMBULATORY_CARE_PROVIDER_SITE_OTHER): Payer: Medicare Other | Admitting: *Deleted

## 2019-01-21 DIAGNOSIS — Z95810 Presence of automatic (implantable) cardiac defibrillator: Secondary | ICD-10-CM | POA: Diagnosis not present

## 2019-01-22 LAB — CUP PACEART REMOTE DEVICE CHECK
Battery Remaining Longevity: 70 mo
Battery Remaining Percentage: 68 %
Battery Voltage: 2.96 V
Brady Statistic RV Percent Paced: 1 %
Date Time Interrogation Session: 20201204040015
HighPow Impedance: 52 Ohm
HighPow Impedance: 52 Ohm
Implantable Lead Implant Date: 20080624
Implantable Lead Location: 753860
Implantable Lead Model: 7121
Implantable Pulse Generator Implant Date: 20170310
Lead Channel Impedance Value: 460 Ohm
Lead Channel Pacing Threshold Amplitude: 0.75 V
Lead Channel Pacing Threshold Pulse Width: 0.5 ms
Lead Channel Sensing Intrinsic Amplitude: 11.3 mV
Lead Channel Setting Pacing Amplitude: 2.5 V
Lead Channel Setting Pacing Pulse Width: 0.5 ms
Lead Channel Setting Sensing Sensitivity: 0.5 mV
Pulse Gen Serial Number: 7310877

## 2019-01-24 ENCOUNTER — Telehealth: Payer: Self-pay | Admitting: Internal Medicine

## 2019-01-24 NOTE — Telephone Encounter (Signed)
New message   Patient wants to know if transmission went through, Please call to discuss.

## 2019-01-24 NOTE — Telephone Encounter (Signed)
Patient notified transmission received. Device function WNL. No events or treated episodes. No reprt of issues just inquiring if transmission received.

## 2019-01-31 DIAGNOSIS — M79671 Pain in right foot: Secondary | ICD-10-CM | POA: Diagnosis not present

## 2019-04-22 ENCOUNTER — Ambulatory Visit (INDEPENDENT_AMBULATORY_CARE_PROVIDER_SITE_OTHER): Payer: Medicare Other | Admitting: *Deleted

## 2019-04-22 DIAGNOSIS — I469 Cardiac arrest, cause unspecified: Secondary | ICD-10-CM | POA: Diagnosis not present

## 2019-04-22 LAB — CUP PACEART REMOTE DEVICE CHECK
Battery Remaining Longevity: 68 mo
Battery Remaining Percentage: 67 %
Battery Voltage: 2.95 V
Brady Statistic RV Percent Paced: 1 %
Date Time Interrogation Session: 20210305040015
HighPow Impedance: 48 Ohm
HighPow Impedance: 48 Ohm
Implantable Lead Implant Date: 20080624
Implantable Lead Location: 753860
Implantable Lead Model: 7121
Implantable Pulse Generator Implant Date: 20170310
Lead Channel Impedance Value: 430 Ohm
Lead Channel Pacing Threshold Amplitude: 0.75 V
Lead Channel Pacing Threshold Pulse Width: 0.5 ms
Lead Channel Sensing Intrinsic Amplitude: 11.3 mV
Lead Channel Setting Pacing Amplitude: 2.5 V
Lead Channel Setting Pacing Pulse Width: 0.5 ms
Lead Channel Setting Sensing Sensitivity: 0.5 mV
Pulse Gen Serial Number: 7310877

## 2019-04-23 NOTE — Progress Notes (Signed)
ICD remote 

## 2019-06-01 DIAGNOSIS — M25561 Pain in right knee: Secondary | ICD-10-CM | POA: Diagnosis not present

## 2019-06-01 DIAGNOSIS — M170 Bilateral primary osteoarthritis of knee: Secondary | ICD-10-CM | POA: Diagnosis not present

## 2019-06-01 DIAGNOSIS — M1712 Unilateral primary osteoarthritis, left knee: Secondary | ICD-10-CM | POA: Diagnosis not present

## 2019-06-01 DIAGNOSIS — Z471 Aftercare following joint replacement surgery: Secondary | ICD-10-CM | POA: Diagnosis not present

## 2019-06-01 DIAGNOSIS — Z96652 Presence of left artificial knee joint: Secondary | ICD-10-CM | POA: Diagnosis not present

## 2019-06-01 DIAGNOSIS — M1711 Unilateral primary osteoarthritis, right knee: Secondary | ICD-10-CM | POA: Diagnosis not present

## 2019-07-04 DIAGNOSIS — M25561 Pain in right knee: Secondary | ICD-10-CM | POA: Diagnosis not present

## 2019-07-04 DIAGNOSIS — M1711 Unilateral primary osteoarthritis, right knee: Secondary | ICD-10-CM | POA: Diagnosis not present

## 2019-07-11 DIAGNOSIS — M170 Bilateral primary osteoarthritis of knee: Secondary | ICD-10-CM | POA: Diagnosis not present

## 2019-07-19 DIAGNOSIS — M1711 Unilateral primary osteoarthritis, right knee: Secondary | ICD-10-CM | POA: Diagnosis not present

## 2019-07-22 ENCOUNTER — Ambulatory Visit (INDEPENDENT_AMBULATORY_CARE_PROVIDER_SITE_OTHER): Payer: Medicare Other | Admitting: *Deleted

## 2019-07-22 ENCOUNTER — Telehealth: Payer: Self-pay

## 2019-07-22 DIAGNOSIS — I472 Ventricular tachycardia: Secondary | ICD-10-CM | POA: Diagnosis not present

## 2019-07-22 NOTE — Telephone Encounter (Signed)
Spoke with patient to remind of missed remote transmission 

## 2019-07-25 LAB — CUP PACEART REMOTE DEVICE CHECK
Battery Remaining Longevity: 66 mo
Battery Remaining Percentage: 64 %
Battery Voltage: 2.95 V
Brady Statistic RV Percent Paced: 1 %
Date Time Interrogation Session: 20210604040018
HighPow Impedance: 52 Ohm
HighPow Impedance: 52 Ohm
Implantable Lead Implant Date: 20080624
Implantable Lead Location: 753860
Implantable Lead Model: 7121
Implantable Pulse Generator Implant Date: 20170310
Lead Channel Impedance Value: 460 Ohm
Lead Channel Pacing Threshold Amplitude: 0.75 V
Lead Channel Pacing Threshold Pulse Width: 0.5 ms
Lead Channel Sensing Intrinsic Amplitude: 11.3 mV
Lead Channel Setting Pacing Amplitude: 2.5 V
Lead Channel Setting Pacing Pulse Width: 0.5 ms
Lead Channel Setting Sensing Sensitivity: 0.5 mV
Pulse Gen Serial Number: 7310877

## 2019-07-27 NOTE — Progress Notes (Signed)
Remote ICD transmission.   

## 2019-08-14 ENCOUNTER — Other Ambulatory Visit: Payer: Self-pay | Admitting: Family Medicine

## 2019-10-21 ENCOUNTER — Ambulatory Visit (INDEPENDENT_AMBULATORY_CARE_PROVIDER_SITE_OTHER): Payer: Medicare Other | Admitting: *Deleted

## 2019-10-21 DIAGNOSIS — I472 Ventricular tachycardia, unspecified: Secondary | ICD-10-CM

## 2019-10-23 LAB — CUP PACEART REMOTE DEVICE CHECK
Battery Remaining Longevity: 65 mo
Battery Remaining Percentage: 63 %
Battery Voltage: 2.95 V
Brady Statistic RV Percent Paced: 1 %
Date Time Interrogation Session: 20210903040015
HighPow Impedance: 55 Ohm
HighPow Impedance: 56 Ohm
Implantable Lead Implant Date: 20080624
Implantable Lead Location: 753860
Implantable Lead Model: 7121
Implantable Pulse Generator Implant Date: 20170310
Lead Channel Impedance Value: 480 Ohm
Lead Channel Pacing Threshold Amplitude: 0.75 V
Lead Channel Pacing Threshold Pulse Width: 0.5 ms
Lead Channel Sensing Intrinsic Amplitude: 11.3 mV
Lead Channel Setting Pacing Amplitude: 2.5 V
Lead Channel Setting Pacing Pulse Width: 0.5 ms
Lead Channel Setting Sensing Sensitivity: 0.5 mV
Pulse Gen Serial Number: 7310877

## 2019-10-26 NOTE — Progress Notes (Signed)
Remote ICD transmission.   

## 2019-11-14 ENCOUNTER — Telehealth: Payer: Self-pay | Admitting: Internal Medicine

## 2019-11-14 NOTE — Telephone Encounter (Signed)
Called and spoke with patient to let him know that Dr. Annamaria Boots agreed to let us use the slot on 11/24/19 at 11:30. Patient is now scheduled. Nothing further needed at this time.

## 2019-11-14 NOTE — Telephone Encounter (Signed)
Called and spoke with patient who states he needs a prescription for CPAP supplies. Last OV was 12/30/2018. Dr. Annamaria Boots would we be able to use your RNA slot for 11/24/19 at 11:30?

## 2019-11-14 NOTE — Telephone Encounter (Signed)
Yes ok 

## 2019-11-15 DIAGNOSIS — H26492 Other secondary cataract, left eye: Secondary | ICD-10-CM | POA: Diagnosis not present

## 2019-11-15 DIAGNOSIS — H52202 Unspecified astigmatism, left eye: Secondary | ICD-10-CM | POA: Diagnosis not present

## 2019-11-15 DIAGNOSIS — R7309 Other abnormal glucose: Secondary | ICD-10-CM | POA: Diagnosis not present

## 2019-11-15 DIAGNOSIS — H5202 Hypermetropia, left eye: Secondary | ICD-10-CM | POA: Diagnosis not present

## 2019-11-15 DIAGNOSIS — Z961 Presence of intraocular lens: Secondary | ICD-10-CM | POA: Diagnosis not present

## 2019-11-15 DIAGNOSIS — H524 Presbyopia: Secondary | ICD-10-CM | POA: Diagnosis not present

## 2019-11-24 ENCOUNTER — Encounter: Payer: Self-pay | Admitting: Internal Medicine

## 2019-11-24 ENCOUNTER — Other Ambulatory Visit: Payer: Self-pay

## 2019-11-24 ENCOUNTER — Ambulatory Visit (INDEPENDENT_AMBULATORY_CARE_PROVIDER_SITE_OTHER): Payer: Medicare Other | Admitting: Internal Medicine

## 2019-11-24 VITALS — BP 138/72 | HR 60 | Temp 97.3°F | Ht 72.0 in | Wt 257.4 lb

## 2019-11-24 DIAGNOSIS — I255 Ischemic cardiomyopathy: Secondary | ICD-10-CM

## 2019-11-24 DIAGNOSIS — Z9989 Dependence on other enabling machines and devices: Secondary | ICD-10-CM | POA: Diagnosis not present

## 2019-11-24 DIAGNOSIS — G4733 Obstructive sleep apnea (adult) (pediatric): Secondary | ICD-10-CM | POA: Diagnosis not present

## 2019-11-24 NOTE — Patient Instructions (Signed)
Order DME [employer]- Please change CPAP auto range to 5-20, continue mask of choice, humidifier, supplies, AirView/ card  Please call if we can help

## 2019-11-24 NOTE — Progress Notes (Signed)
12/30/2018- 82yoM former smoker for sleep evaluation. Patient worked with Dr Halford Chessman in 2008. NPSG 10/12/06- AHI 32, desaturation to 77% Medical problem list includes CAD,  Ischemic cardiomyopathy/ AICD, HBP, Venous insufficiency, GERD, DM, CRF III,  CPAP  [employer].  He has been using loaner machine till this appointment. Able to use all night, every night and sleeps much better with CPAP. Thinks pressure 11. Old machine worn out.  No download. No sleep meds. Epworth score on CPAP 1 Body weight today  254 lbs ENT+ tonsillectomy, denies lung disease. Heart problems stable.  11/24/19- 77 yoM former smoker followed for OSA, complicated by  CAD,  Ischemic cardiomyopathy/ AICD, HBP, Venous insufficiency, GERD, DM, CRF III,  CPAP auto 5-15/ [employer] Download compliance 100%, AHI 17.9/ hr. Most breakthrough events are centrals. Discussed comfort and goals. He feels he sleeps well, denies daytime sleepiness.  Body weight today 257 lbs Covid vax-  Antivax, considers the vaccine " a bio-weapon" Flu vax-plans next week  ROS-see HPI  + = positive Constitutional:    weight loss, night sweats, fevers, chills, fatigue, lassitude. HEENT:    headaches, difficulty swallowing, tooth/dental problems, sore throat,       sneezing, itching, ear ache, nasal congestion, post nasal drip, snoring CV:    chest pain, orthopnea, PND, swelling in lower extremities, anasarca,                                   dizziness, palpitations Resp:   shortness of breath with exertion or at rest.                productive cough,   non-productive cough, coughing up of blood.              change in color of mucus.  wheezing.   Skin:    rash or lesions. GI:  No-   heartburn, indigestion, abdominal pain, nausea, vomiting, diarrhea,                 change in bowel habits, loss of appetite GU: dysuria, change in color of urine, no urgency or frequency.   flank pain. MS:   joint pain, stiffness, decreased range of motion, back pain. Neuro-      nothing unusual Psych:  change in mood or affect.  depression or anxiety.   memory loss.  OBJ- Physical Exam General- Alert, Oriented, Affect-appropriate, Distress- none acute, +Obese Skin- rash-none, lesions- none, excoriation- none Lymphadenopathy- none Head- atraumatic            Eyes- Gross vision intact, PERRLA, conjunctivae and secretions clear            Ears- +Hearing,aid            Nose- Clear, no-Septal dev, mucus, polyps, erosion, perforation             Throat- Mallampati II , mucosa clear , drainage- none, tonsils- atrophic,  + teeth Neck- flexible , trachea midline, no stridor , thyroid nl, carotid no bruit Chest - symmetrical excursion , unlabored           Heart/CV- RRR , no murmur , no gallop  , no rub, nl s1 s2                           - JVD- none , edema- none, stasis changes- none, varices- none  Lung- clear to P&A, wheeze- none, cough- none , dullness-none, rub- none           Chest wall-  Abd-  Br/ Gen/ Rectal- Not done, not indicated Extrem- cyanosis- none, clubbing, none, atrophy- none, strength- nl Neuro- grossly intact to observation

## 2019-11-26 ENCOUNTER — Encounter: Payer: Self-pay | Admitting: Internal Medicine

## 2019-11-26 NOTE — Assessment & Plan Note (Signed)
On-going cardiology supervision

## 2019-11-26 NOTE — Assessment & Plan Note (Signed)
He benefits from CPAP with good compliance and control of obstructive events. Residual central events are not disturbing sleep. These usually reflect impaired cerebral perfusion, c/w known cardiovascular disease. The indications for treatment would include impaired/ restless sleep, nocturnal hypoxemia. Plan-- discussed possible BIPAP/ ASV titration if this pattern persists / becomes symptomatic.

## 2019-11-28 ENCOUNTER — Other Ambulatory Visit: Payer: Self-pay

## 2019-11-28 ENCOUNTER — Ambulatory Visit (INDEPENDENT_AMBULATORY_CARE_PROVIDER_SITE_OTHER): Payer: Medicare Other | Admitting: Family Medicine

## 2019-11-28 ENCOUNTER — Encounter: Payer: Self-pay | Admitting: Family Medicine

## 2019-11-28 VITALS — BP 116/64 | HR 73 | Temp 97.6°F | Resp 16 | Ht 72.0 in | Wt 260.2 lb

## 2019-11-28 DIAGNOSIS — N183 Chronic kidney disease, stage 3 unspecified: Secondary | ICD-10-CM | POA: Diagnosis not present

## 2019-11-28 DIAGNOSIS — E7800 Pure hypercholesterolemia, unspecified: Secondary | ICD-10-CM

## 2019-11-28 DIAGNOSIS — E119 Type 2 diabetes mellitus without complications: Secondary | ICD-10-CM | POA: Diagnosis not present

## 2019-11-28 DIAGNOSIS — I10 Essential (primary) hypertension: Secondary | ICD-10-CM

## 2019-11-28 DIAGNOSIS — I255 Ischemic cardiomyopathy: Secondary | ICD-10-CM

## 2019-11-28 NOTE — Progress Notes (Signed)
Office Note 11/28/2019  CC:  Chief Complaint  Patient presents with  . Follow-up    RCI, pt is not fasting    HPI:  [name] is a 84 y.o. White male who is here for 6 mo f/u HTN, DM, HLD, and CRI III.  DM: no home glucose checks. Feet: no burning, tingling, or numbness.  HTN: no home bp monitoring.  HLD:  Tolerating statin. Working on diet lately after a lapse during covid pandemic.  CRI III: fluid intake is good.   Takes tylenol if in pain.  Had been taking ibup "once in while" if hurting from hard day's work.  Still using CPAP, followed up with Dr. Annamaria Boots recently.  Past Medical History:  Diagnosis Date  . AICD (automatic cardioverter/defibrillator) present   . Anemia, unspecified   . Arthritis   . BPH with obstruction/lower urinary tract symptoms    Dr. Gaynelle Arabian.  [name] CAD (coronary artery disease)    a. MI/CABG 1996. b. VF arrest 2008: cath with severe native 3v disease, 3/3 grafts widely patent, mild to moderate left ventricular systolic dysfunction secondary to anteroapical scar. EF 40-45%.  . Cardiac arrest [facility]) 2008   2008: aborted cardiac arrest 2008 (c/b encephalopathy, VDRF, aspiriation PNA, CHF, elevated LFTs), s/p St. Jude ICD placement.  . Chronic left ventricular systolic dysfunction    Ischemic CM  . Chronic renal insufficiency, stage 3 (moderate) (HCC)    GFR 40s-50s  . Chronic venous insufficiency   . Diabetes mellitus without complication (HCC)    diet  controlled  . Disorder of bone and cartilage, unspecified   . Erectile dysfunction       "           "      "              "                  "    . Essential hypertension   . GERD (gastroesophageal reflux disease)   . Hx of adenomatous colonic polyps 2003, 2013   Pt declines any further colonoscopies as of 12/2016.  [name] Hypercholesterolemia   . Hypogonadism male    managed by [facility] (Dr. Gaynelle Arabian)  . Ischemic cardiomyopathy    a. EF 40-45% by cath 2008.  EF 39% by  nuclear study 2016  . Meniscal injury Jan/Feb 2016   Sustained shoveling snow; repaired by Dr. Gladstone Lighter   . Myocardial infarction ([place])   . Obesity   . OSA on CPAP    sleep study 2008, severe OSA: CPAP 11 cm H2O  . Supraspinatus tendon tear 12/03/2016   Left shoulder.  Left shoulder open rotator cuff repair with graft and anchor (Left)--01/28/17.  [name] Urinary tract infection    diagnosed on 11/13/2015     Past Surgical History:  Procedure Laterality Date  . CARDIAC DEFIBRILLATOR PLACEMENT    . CARDIOVASCULAR STRESS TEST  04/2014   High risk study: large scar, EF 39%, +WM abnl  . COLONOSCOPY  06/10/11   One diminutive polyp removed (tubular adenoma, no high grade dysplasia).  Repeat colonoscopy 5 yrs (Dr. Ardis Hughs).  . COLONOSCOPY W/ POLYPECTOMY  2003   Adenomatous; repeat 2006 was recommended but this was not done until 06/10/2011 (by different GI MD).  Pt declines any further colonoscopies as of 12/2016.  [name] CORONARY ARTERY BYPASS GRAFT  1996   3 vessel  . EP IMPLANTABLE DEVICE N/A 04/27/2015   Procedure: ICD  Nature conservation officer;  Surgeon: Deboraha Sprang, MD;  Location: [place] [facility];  Service: Cardiovascular;  Laterality: N/A;  . KNEE CARTILAGE SURGERY  04/2014   Dr. Gladstone Lighter: torn meniscus repair  . PACEMAKER PLACEMENT  07/2006   St Jude current VRRF--generator change 03/2015  . POLYPECTOMY    . right carpal tunnel release     . SHOULDER OPEN ROTATOR CUFF REPAIR Left 01/28/2017   Procedure: Left shoulder open rotator cuff repair with graft and anchor;  Surgeon: Latanya Maudlin, MD;  Location: [facility];  Service: Orthopedics;  Laterality: Left;  Interscalene Block  . TOTAL KNEE ARTHROPLASTY Left 11/21/2015   Procedure: LEFT TOTAL KNEE ARTHROPLASTY;  Surgeon: Latanya Maudlin, MD;  Location: [facility];  Service: Orthopedics;  Laterality: Left;    Family History  Problem Relation Age of Onset  . Heart disease Mother   . Stroke Mother   . Heart disease Father   . Colon cancer Neg Hx   .  Esophageal cancer Neg Hx   . Stomach cancer Neg Hx   . Rectal cancer Neg Hx     Social History   Socioeconomic History  . Marital status: Married    Spouse name: Not on file  . Number of children: Not on file  . Years of education: Not on file  . Highest education level: Not on file  Occupational History  . Not on file  Tobacco Use  . Smoking status: Former Smoker    Types: Cigarettes    Quit date: 02/17/1970    Years since quitting: 49.8  . Smokeless tobacco: Never Used  Vaping Use  . Vaping Use: Never used  Substance and Sexual Activity  . Alcohol use: Yes    Alcohol/week: 7.0 standard drinks    Types: 7 Glasses of wine per week    Comment: drinks wine daily   . Drug use: No  . Sexual activity: Not on file  Other Topics Concern  . Not on file  Social History Narrative   Married, 2 daughters, 4 grandchildren.   Retired Producer, television/film/video for [employer] [phone]).   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   Not sedentary, but has no formal exercise regimen.   Ex-smoker, quit in his 65s.  Drinks one glass of wine daily.   No drug use.   Social Determinants of Health   Financial Resource Strain:   . Difficulty of Paying Living Expenses: Not on file  Food Insecurity:   . Worried About Charity fundraiser in the Last Year: Not on file  . Ran Out of Food in the Last Year: Not on file  Transportation Needs:   . Lack of Transportation (Medical): Not on file  . Lack of Transportation (Non-Medical): Not on file  Physical Activity:   . Days of Exercise per Week: Not on file  . Minutes of Exercise per Session: Not on file  Stress:   . Feeling of Stress : Not on file  Social Connections:   . Frequency of Communication with Friends and Family: Not on file  . Frequency of Social Gatherings with Friends and Family: Not on file  . Attends Religious Services: Not on file  . Active Member of Clubs or Organizations: Not on file  . Attends Archivist Meetings: Not on file  .  Marital Status: Not on file  Intimate Partner Violence:   . Fear of Current or Ex-Partner: Not on file  . Emotionally Abused: Not on file  . Physically Abused:  Not on file  . Sexually Abused: Not on file    Outpatient Medications Prior to Visit  Medication Sig Dispense Refill  . aspirin EC 81 MG tablet Take 81 mg by mouth daily.    [name] atorvastatin (LIPITOR) 20 MG tablet Take 1 tablet (20 mg total) by mouth daily. 90 tablet 3  . b complex vitamins tablet Take 1 tablet by mouth daily.    [name] CALCIUM PO Take 1 tablet by mouth daily.    [name] lisinopril (ZESTRIL) 5 MG tablet Take 1 tablet (5 mg total) by mouth daily. 90 tablet 3  . Menthol, Topical Analgesic, (BIOFREEZE EX) Apply 1 application topically daily as needed (for pain).     . metoprolol tartrate (LOPRESSOR) 25 MG tablet Take 1 tablet (25 mg total) by mouth 2 (two) times daily. 90 tablet 3  . Multiple Vitamin (MULTIVITAMIN) tablet Take 1 tablet by mouth daily.    . NON FORMULARY Take 1 tablet by mouth daily. OPC 3.  antioxidant protection    . pantoprazole (PROTONIX) 40 MG tablet Take 1 tablet (40 mg total) by mouth daily. 90 tablet 3  . traMADol (ULTRAM) 50 MG tablet tramadol 50 mg tablet    . vitamin C (ASCORBIC ACID) 500 MG tablet Take 500 mg by mouth daily.    [name] VITAMIN D PO Take by mouth daily.    . Zinc Sulfate (ZINC 15 PO) Take by mouth.     No facility-administered medications prior to visit.    Allergies  Allergen Reactions  . Naproxen Sodium Nausea Only    ROS ROS: no fevers, no CP, no SOB, no wheezing, no cough, no dizziness, no HAs, no rashes, no melena/hematochezia.  No polyuria or polydipsia.  No myalgias or arthralgias.  No focal weakness, paresthesias, or tremors.  No acute vision or hearing abnormalities. No n/v/d or abd pain.  No palpitations.    PE; Vitals with BMI 11/28/2019 11/24/2019 01/20/2019  Height 6\' 0"  6\' 0"  -  Weight 260 lbs 3 oz 257 lbs 6 oz -  BMI 85.88 50.2 -  Systolic 774 128 786  Diastolic 64  72 74  Pulse 73 60 71   Gen: Alert, well appearing.  Patient is oriented to person, place, time, and situation. AFFECT: pleasant, lucid thought and speech. CV: RRR, no m/r/g.   LUNGS: CTA bilat, nonlabored resps, good aeration in all lung fields. EXT: no clubbing or cyanosis.  2 + bilat LL pitting edema.   Pertinent labs:  Lab Results  Component Value Date   TSH 4.20 03/19/2010   Lab Results  Component Value Date   WBC 6.7 01/26/2017   HGB 13.0 01/26/2017   HCT 38.0 (L) 01/26/2017   MCV 92.2 01/26/2017   PLT 222 01/26/2017   Lab Results  Component Value Date   CREATININE 1.47 06/14/2018   BUN 20 06/14/2018   NA 138 06/14/2018   K 4.6 06/14/2018   CL 102 06/14/2018   CO2 26 06/14/2018   Lab Results  Component Value Date   ALT 24 06/14/2018   AST 20 06/14/2018   ALKPHOS 57 06/14/2018   BILITOT 0.9 06/14/2018   Lab Results  Component Value Date   CHOL 117 06/14/2018   Lab Results  Component Value Date   HDL 38.50 (L) 06/14/2018   Lab Results  Component Value Date   LDLCALC 60 06/14/2018   Lab Results  Component Value Date   TRIG 92.0 06/14/2018   Lab Results  Component Value Date  CHOLHDL 3 06/14/2018   Lab Results  Component Value Date   PSA 0.34 12/18/2011   PSA 0.43 03/24/2011   PSA 0.34 03/19/2010   Lab Results  Component Value Date   HGBA1C 7.4 (H) 06/14/2018    ASSESSMENT AND PLAN:   1) DM; diet has not been very good but activity level stable.  No formal exercise, though. He has been trying to eat better last couple weeks since noting his wt going up. Hba1c, urine microalb/cr --future. Feet exam normal today.  2) HTN: no home bp monitoring. BP great here today. Continue lisinopril 5mg  qd and lopressor 25mg  bid.  3) HLD: tolerating statin. Not fasting today. Return for fasting chol lab. Hepatic panel 6 mo ago normal, repeat this at next f/u in 6 mo.  4) CRI III: hydrates well.  Minimal NSAID use. I discouraged any further  NSAIDs if he can avoid them.  5) Prostate and colon ca screening-->no further needed. Vaccines: all utd.  Covid->has not had this vaccine.  Flu->wants to defer and get at pharmacy in November.  An After Visit Summary was printed and given to the patient.  FOLLOW UP:  Return for 6 mo f/u RCI; fasting lab appt at his earliest convenience.  Signed:  Crissie Sickles, MD           11/28/2019

## 2019-12-06 ENCOUNTER — Other Ambulatory Visit: Payer: Self-pay

## 2019-12-06 ENCOUNTER — Ambulatory Visit (INDEPENDENT_AMBULATORY_CARE_PROVIDER_SITE_OTHER): Payer: Medicare Other

## 2019-12-06 DIAGNOSIS — E119 Type 2 diabetes mellitus without complications: Secondary | ICD-10-CM | POA: Diagnosis not present

## 2019-12-06 DIAGNOSIS — E7800 Pure hypercholesterolemia, unspecified: Secondary | ICD-10-CM | POA: Diagnosis not present

## 2019-12-06 DIAGNOSIS — I10 Essential (primary) hypertension: Secondary | ICD-10-CM

## 2019-12-06 DIAGNOSIS — N183 Chronic kidney disease, stage 3 unspecified: Secondary | ICD-10-CM | POA: Diagnosis not present

## 2019-12-06 LAB — BASIC METABOLIC PANEL
BUN: 19 mg/dL (ref 6–23)
CO2: 29 mEq/L (ref 19–32)
Calcium: 9.4 mg/dL (ref 8.4–10.5)
Chloride: 102 mEq/L (ref 96–112)
Creatinine, Ser: 1.51 mg/dL — ABNORMAL HIGH (ref 0.40–1.50)
GFR: 42.44 mL/min — ABNORMAL LOW (ref >60.00)
Glucose, Bld: 145 mg/dL — ABNORMAL HIGH (ref 70–99)
Potassium: 4.4 mEq/L (ref 3.5–5.1)
Sodium: 139 mEq/L (ref 135–145)

## 2019-12-06 LAB — LIPID PANEL
Cholesterol: 120 mg/dL (ref 0–200)
HDL: 38.5 mg/dL — ABNORMAL LOW (ref >39.00)
LDL Cholesterol: 58 mg/dL (ref 0–99)
NonHDL: 81.45
Total CHOL/HDL Ratio: 3
Triglycerides: 115 mg/dL (ref 0.0–149.0)
VLDL: 23 mg/dL (ref 0.0–40.0)

## 2019-12-06 LAB — MICROALBUMIN / CREATININE URINE RATIO
Creatinine,U: 85.4 mg/dL
Microalb Creat Ratio: 0.8 mg/g (ref 0.0–30.0)
Microalb, Ur: <0.7 mg/dL (ref 0.0–1.9)

## 2019-12-06 LAB — HEMOGLOBIN A1C: Hgb A1c MFr Bld: 7.8 % — ABNORMAL HIGH (ref 4.6–6.5)

## 2019-12-09 MED ORDER — SITAGLIPTIN PHOSPHATE 50 MG PO TABS: 50.0000 mg | ORAL_TABLET | Freq: Every day | ORAL | 5 refills | Status: DC

## 2019-12-09 NOTE — Addendum Note (Signed)
Addended by: Octaviano Glow on: 12/09/2019 03:02 PM   Modules accepted: Orders

## 2020-01-20 ENCOUNTER — Ambulatory Visit (INDEPENDENT_AMBULATORY_CARE_PROVIDER_SITE_OTHER): Payer: Medicare Other

## 2020-01-20 DIAGNOSIS — I472 Ventricular tachycardia, unspecified: Secondary | ICD-10-CM

## 2020-01-22 LAB — CUP PACEART REMOTE DEVICE CHECK
Battery Remaining Longevity: 62 mo
Battery Remaining Percentage: 61 %
Battery Voltage: 2.93 V
Brady Statistic RV Percent Paced: 1 %
Date Time Interrogation Session: 20211203074433
HighPow Impedance: 55 Ohm
HighPow Impedance: 55 Ohm
Implantable Lead Implant Date: 20080624
Implantable Lead Location: 753860
Implantable Lead Model: 7121
Implantable Pulse Generator Implant Date: 20170310
Lead Channel Impedance Value: 460 Ohm
Lead Channel Pacing Threshold Amplitude: 0.75 V
Lead Channel Pacing Threshold Pulse Width: 0.5 ms
Lead Channel Sensing Intrinsic Amplitude: 11.3 mV
Lead Channel Setting Pacing Amplitude: 2.5 V
Lead Channel Setting Pacing Pulse Width: 0.5 ms
Lead Channel Setting Sensing Sensitivity: 0.5 mV
Pulse Gen Serial Number: 7310877

## 2020-01-23 ENCOUNTER — Ambulatory Visit: Payer: Medicare Other

## 2020-01-23 DIAGNOSIS — M1711 Unilateral primary osteoarthritis, right knee: Secondary | ICD-10-CM | POA: Diagnosis not present

## 2020-01-25 LAB — CUP PACEART REMOTE DEVICE CHECK
Battery Remaining Longevity: 62 mo
Battery Remaining Percentage: 61 %
Battery Voltage: 2.93 V
Brady Statistic RV Percent Paced: 1 %
Date Time Interrogation Session: 20211206020015
HighPow Impedance: 50 Ohm
HighPow Impedance: 50 Ohm
Implantable Lead Implant Date: 20080624
Implantable Lead Location: 753860
Implantable Lead Model: 7121
Implantable Pulse Generator Implant Date: 20170310
Lead Channel Impedance Value: 440 Ohm
Lead Channel Pacing Threshold Amplitude: 0.75 V
Lead Channel Pacing Threshold Pulse Width: 0.5 ms
Lead Channel Sensing Intrinsic Amplitude: 11.3 mV
Lead Channel Setting Pacing Amplitude: 2.5 V
Lead Channel Setting Pacing Pulse Width: 0.5 ms
Lead Channel Setting Sensing Sensitivity: 0.5 mV
Pulse Gen Serial Number: 7310877

## 2020-01-30 DIAGNOSIS — M25561 Pain in right knee: Secondary | ICD-10-CM | POA: Diagnosis not present

## 2020-02-01 NOTE — Progress Notes (Signed)
Remote ICD transmission.   

## 2020-02-06 DIAGNOSIS — M1711 Unilateral primary osteoarthritis, right knee: Secondary | ICD-10-CM | POA: Diagnosis not present

## 2020-02-08 ENCOUNTER — Other Ambulatory Visit: Payer: Self-pay | Admitting: Family Medicine

## 2020-03-13 DIAGNOSIS — Z23 Encounter for immunization: Secondary | ICD-10-CM | POA: Diagnosis not present

## 2020-04-16 ENCOUNTER — Other Ambulatory Visit: Payer: Self-pay | Admitting: Family Medicine

## 2020-04-19 ENCOUNTER — Ambulatory Visit (INDEPENDENT_AMBULATORY_CARE_PROVIDER_SITE_OTHER): Payer: Medicare Other

## 2020-04-19 DIAGNOSIS — I469 Cardiac arrest, cause unspecified: Secondary | ICD-10-CM | POA: Diagnosis not present

## 2020-04-24 LAB — CUP PACEART REMOTE DEVICE CHECK
Battery Remaining Longevity: 61 mo
Battery Remaining Percentage: 59 %
Battery Voltage: 2.93 V
Brady Statistic RV Percent Paced: 1 %
Date Time Interrogation Session: 20220307025636
HighPow Impedance: 51 Ohm
HighPow Impedance: 51 Ohm
Implantable Lead Implant Date: 20080624
Implantable Lead Location: 753860
Implantable Lead Model: 7121
Implantable Pulse Generator Implant Date: 20170310
Lead Channel Impedance Value: 440 Ohm
Lead Channel Pacing Threshold Amplitude: 0.75 V
Lead Channel Pacing Threshold Pulse Width: 0.5 ms
Lead Channel Sensing Intrinsic Amplitude: 11.3 mV
Lead Channel Setting Pacing Amplitude: 2.5 V
Lead Channel Setting Pacing Pulse Width: 0.5 ms
Lead Channel Setting Sensing Sensitivity: 0.5 mV
Pulse Gen Serial Number: 7310877

## 2020-04-30 NOTE — Progress Notes (Signed)
Remote ICD transmission.   

## 2020-05-28 ENCOUNTER — Ambulatory Visit: Payer: Medicare Other | Admitting: Family Medicine

## 2020-05-28 DIAGNOSIS — I10 Essential (primary) hypertension: Secondary | ICD-10-CM

## 2020-05-28 DIAGNOSIS — N183 Chronic kidney disease, stage 3 unspecified: Secondary | ICD-10-CM

## 2020-05-28 DIAGNOSIS — E7800 Pure hypercholesterolemia, unspecified: Secondary | ICD-10-CM

## 2020-05-28 DIAGNOSIS — E119 Type 2 diabetes mellitus without complications: Secondary | ICD-10-CM

## 2020-05-28 DIAGNOSIS — Z0289 Encounter for other administrative examinations: Secondary | ICD-10-CM

## 2020-05-28 NOTE — Progress Notes (Deleted)
OFFICE VISIT  05/28/2020  CC: No chief complaint on file.   HPI:    Patient is a 85 y.o. Caucasian male who presents for 6 mo f/u HTN, DM, HLD, and CRI III. A/P as of last visit: "1) DM; diet has not been very good but activity level stable.  No formal exercise, though. He has been trying to eat better last couple weeks since noting his wt going up. Hba1c, urine microalb/cr --future. Feet exam normal today.  2) HTN: no home bp monitoring. BP great here today. Continue lisinopril 5mg  qd and lopressor 25mg  bid.  3) HLD: tolerating statin. Not fasting today. Return for fasting chol lab. Hepatic panel 6 mo ago normal, repeat this at next f/u in 6 mo.  4) CRI III: hydrates well.  Minimal NSAID use. I discouraged any further NSAIDs if he can avoid them.  5) Prostate and colon ca screening-->no further needed. Vaccines: all utd.  Covid->has not had this vaccine.  Flu->wants to defer and get at pharmacy in November."  INTERIM HX: ***  DM: A1c up to 7.8% last visit so I started him on januvia 50mg  qd.     Past Medical History:  Diagnosis Date  . AICD (automatic cardioverter/defibrillator) present   . Anemia, unspecified   . Arthritis   . BPH with obstruction/lower urinary tract symptoms    Dr. Gaynelle Arabian.  [name] CAD (coronary artery disease)    a. MI/CABG 1996. b. VF arrest 2008: cath with severe native 3v disease, 3/3 grafts widely patent, mild to moderate left ventricular systolic dysfunction secondary to anteroapical scar. EF 40-45%.  . Cardiac arrest [facility]) 2008   2008: aborted cardiac arrest 2008 (c/b encephalopathy, VDRF, aspiriation PNA, CHF, elevated LFTs), s/p St. Jude ICD placement.  . Chronic left ventricular systolic dysfunction    Ischemic CM  . Chronic renal insufficiency, stage 3 (moderate) (HCC)    GFR 40s-50s  . Chronic venous insufficiency   . Diabetes mellitus without complication (HCC)    diet  controlled  . Disorder of bone and cartilage, unspecified    . Erectile dysfunction       "           "      "              "                  "    . Essential hypertension   . GERD (gastroesophageal reflux disease)   . Hx of adenomatous colonic polyps 2003, 2013   Pt declines any further colonoscopies as of 12/2016.  [name] Hypercholesterolemia   . Hypogonadism male    managed by [facility] (Dr. Gaynelle Arabian)  . Ischemic cardiomyopathy    a. EF 40-45% by cath 2008.  EF 39% by nuclear study 2016  . Meniscal injury Jan/Feb 2016   Sustained shoveling snow; repaired by Dr. Gladstone Lighter   . Myocardial infarction ([place])   . Obesity   . OSA on CPAP    sleep study 2008, severe OSA: CPAP 11 cm H2O  . Supraspinatus tendon tear 12/03/2016   Left shoulder.  Left shoulder open rotator cuff repair with graft and anchor (Left)--01/28/17.  [name] Urinary tract infection    diagnosed on 11/13/2015     Past Surgical History:  Procedure Laterality Date  . CARDIAC DEFIBRILLATOR PLACEMENT    . CARDIOVASCULAR STRESS TEST  04/2014   High risk study: large scar, EF 39%, +WM abnl  . COLONOSCOPY  06/10/11   One diminutive polyp removed (tubular adenoma, no high grade dysplasia).  Repeat colonoscopy 5 yrs (Dr. Ardis Hughs).  . COLONOSCOPY W/ POLYPECTOMY  2003   Adenomatous; repeat 2006 was recommended but this was not done until 06/10/2011 (by different GI MD).  Pt declines any further colonoscopies as of 12/2016.  [name] CORONARY ARTERY BYPASS GRAFT  1996   3 vessel  . EP IMPLANTABLE DEVICE N/A 04/27/2015   Procedure: ICD Generator Changeout;  Surgeon: Deboraha Sprang, MD;  Location: [place] [facility];  Service: Cardiovascular;  Laterality: N/A;  . KNEE CARTILAGE SURGERY  04/2014   Dr. Gladstone Lighter: torn meniscus repair  . PACEMAKER PLACEMENT  07/2006   St Jude current VRRF--generator change 03/2015  . POLYPECTOMY    . right carpal tunnel release     . SHOULDER OPEN ROTATOR CUFF REPAIR Left 01/28/2017   Procedure: Left shoulder open rotator cuff repair with graft and anchor;  Surgeon:  Latanya Maudlin, MD;  Location: [facility];  Service: Orthopedics;  Laterality: Left;  Interscalene Block  . TOTAL KNEE ARTHROPLASTY Left 11/21/2015   Procedure: LEFT TOTAL KNEE ARTHROPLASTY;  Surgeon: Latanya Maudlin, MD;  Location: [facility];  Service: Orthopedics;  Laterality: Left;    Outpatient Medications Prior to Visit  Medication Sig Dispense Refill  . aspirin EC 81 MG tablet Take 81 mg by mouth daily.    [name] atorvastatin (LIPITOR) 20 MG tablet TAKE 1 TABLET BY MOUTH DAILY 90 tablet 1  . b complex vitamins tablet Take 1 tablet by mouth daily.    [name] CALCIUM PO Take 1 tablet by mouth daily.    [name] lisinopril (ZESTRIL) 5 MG tablet TAKE ONE TABLET BY MOUTH DAILY 90 tablet 1  . Menthol, Topical Analgesic, (BIOFREEZE EX) Apply 1 application topically daily as needed (for pain).     . metoprolol tartrate (LOPRESSOR) 25 MG tablet Take 1 tablet (25 mg total) by mouth 2 (two) times daily. 90 tablet 3  . Multiple Vitamin (MULTIVITAMIN) tablet Take 1 tablet by mouth daily.    . NON FORMULARY Take 1 tablet by mouth daily. OPC 3.  antioxidant protection    . pantoprazole (PROTONIX) 40 MG tablet TAKE 1 TABLET BY MOUTH DAILY 90 tablet 1  . sitaGLIPtin (JANUVIA) 50 MG tablet Take 1 tablet (50 mg total) by mouth daily. 30 tablet 5  . traMADol (ULTRAM) 50 MG tablet tramadol 50 mg tablet    . vitamin C (ASCORBIC ACID) 500 MG tablet Take 500 mg by mouth daily.    [name] VITAMIN D PO Take by mouth daily.    . Zinc Sulfate (ZINC 15 PO) Take by mouth.     No facility-administered medications prior to visit.    Allergies  Allergen Reactions  . Naproxen Sodium Nausea Only    ROS As per HPI  PE: Vitals with BMI 11/28/2019 11/24/2019 01/20/2019  Height 6\' 0"  6\' 0"  -  Weight 260 lbs 3 oz 257 lbs 6 oz -  BMI 19.50 93.2 -  Systolic 671 245 809  Diastolic 64 72 74  Pulse 73 60 71     ***  LABS:  Lab Results  Component Value Date   TSH 4.20 03/19/2010   Lab Results  Component Value Date   WBC 6.7 01/26/2017    HGB 13.0 01/26/2017   HCT 38.0 (L) 01/26/2017   MCV 92.2 01/26/2017   PLT 222 01/26/2017   Lab Results  Component Value Date   CREATININE 1.51 (H) 12/06/2019  BUN 19 12/06/2019   NA 139 12/06/2019   K 4.4 12/06/2019   CL 102 12/06/2019   CO2 29 12/06/2019   Lab Results  Component Value Date   ALT 24 06/14/2018   AST 20 06/14/2018   ALKPHOS 57 06/14/2018   BILITOT 0.9 06/14/2018   Lab Results  Component Value Date   CHOL 120 12/06/2019   Lab Results  Component Value Date   HDL 38.50 (L) 12/06/2019   Lab Results  Component Value Date   LDLCALC 58 12/06/2019   Lab Results  Component Value Date   TRIG 115.0 12/06/2019   Lab Results  Component Value Date   CHOLHDL 3 12/06/2019   Lab Results  Component Value Date   PSA 0.34 12/18/2011   PSA 0.43 03/24/2011   PSA 0.34 03/19/2010   Lab Results  Component Value Date   HGBA1C 7.8 (H) 12/06/2019   IMPRESSION AND PLAN:  No problem-specific Assessment & Plan notes found for this encounter.   An After Visit Summary was printed and given to the patient.  FOLLOW UP: No follow-ups on file.  Signed:  Crissie Sickles, MD           05/28/2020

## 2020-05-29 NOTE — Progress Notes (Signed)
OFFICE VISIT  05/30/2020  CC:  Chief Complaint  Patient presents with  . Follow-up    RCI, fasting    HPI:    Patient is a 85 y.o. Caucasian male who presents for 6 mo f/u HTN, DM, HLD, and CRI III. A/P as of last visit: "1) DM; diet has not been very good but activity level stable.  No formal exercise, though. He has been trying to eat better last couple weeks since noting his wt going up. Hba1c, urine microalb/cr --future. Feet exam normal today.  2) HTN: no home bp monitoring. BP great here today. Continue lisinopril 5mg  qd and lopressor 25mg  bid.  3) HLD: tolerating statin. Not fasting today. Return for fasting chol lab. Hepatic panel 6 mo ago normal, repeat this at next f/u in 6 mo.  4) CRI III: hydrates well.  Minimal NSAID use. I discouraged any further NSAIDs if he can avoid them.  5) Prostate and colon ca screening-->no further needed. Vaccines: all utd.  Covid->has not had this vaccine.  Flu->wants to defer and get at pharmacy in November."  INTERIM HX: Feeling well.  DM: his Hba1c was up to 7.8% last visit so I started him on januvia 50mg  qd. He took med x 1 mo but too $$ so stopped. Made signif dietary changes. Active but no formal exercise.  HTN: no home monitoring.  HLD: tolerating atorva 20mg  qd.  CRI: no NSAIDs.   Drinks water well.  ROS as above, plus--> no fevers, no CP, no SOB, no wheezing, no cough, no dizziness, no HAs, no rashes, no melena/hematochezia.  No polyuria or polydipsia.  No myalgias.  +intermittent L knee pain, chronic.  No focal weakness, paresthesias, or tremors.  No acute vision or hearing abnormalities.  No dysuria or unusual/new urinary urgency or frequency.  No recent changes in lower legs. No n/v/d or abd pain.  No palpitations.    Past Medical History:  Diagnosis Date  . AICD (automatic cardioverter/defibrillator) present   . Anemia, unspecified   . Arthritis   . BPH with obstruction/lower urinary tract symptoms     Dr. Gaynelle Arabian.  [name] CAD (coronary artery disease)    a. MI/CABG 1996. b. VF arrest 2008: cath with severe native 3v disease, 3/3 grafts widely patent, mild to moderate left ventricular systolic dysfunction secondary to anteroapical scar. EF 40-45%.  . Cardiac arrest [facility]) 2008   2008: aborted cardiac arrest 2008 (c/b encephalopathy, VDRF, aspiriation PNA, CHF, elevated LFTs), s/p St. Jude ICD placement.  . Chronic left ventricular systolic dysfunction    Ischemic CM  . Chronic renal insufficiency, stage 3 (moderate) (HCC)    GFR 40s-50s  . Chronic venous insufficiency   . Diabetes mellitus without complication (HCC)    diet  controlled  . Disorder of bone and cartilage, unspecified   . Erectile dysfunction       "           "      "              "                  "    . Essential hypertension   . GERD (gastroesophageal reflux disease)   . Hx of adenomatous colonic polyps 2003, 2013   Pt declines any further colonoscopies as of 12/2016.  [name] Hypercholesterolemia   . Hypogonadism male    managed by [facility] (Dr. Gaynelle Arabian)  . Ischemic cardiomyopathy  a. EF 40-45% by cath 2008.  EF 39% by nuclear study 2016  . Meniscal injury Jan/Feb 2016   Sustained shoveling snow; repaired by Dr. Gladstone Lighter   . Myocardial infarction ([place])   . Obesity   . OSA on CPAP    sleep study 2008, severe OSA: CPAP 11 cm H2O  . Supraspinatus tendon tear 12/03/2016   Left shoulder.  Left shoulder open rotator cuff repair with graft and anchor (Left)--01/28/17.  [name] Urinary tract infection    diagnosed on 11/13/2015     Past Surgical History:  Procedure Laterality Date  . CARDIAC DEFIBRILLATOR PLACEMENT    . CARDIOVASCULAR STRESS TEST  04/2014   High risk study: large scar, EF 39%, +WM abnl  . COLONOSCOPY  06/10/11   One diminutive polyp removed (tubular adenoma, no high grade dysplasia).  Repeat colonoscopy 5 yrs (Dr. Ardis Hughs).  . COLONOSCOPY W/ POLYPECTOMY  2003   Adenomatous; repeat 2006 was  recommended but this was not done until 06/10/2011 (by different GI MD).  Pt declines any further colonoscopies as of 12/2016.  [name] CORONARY ARTERY BYPASS GRAFT  1996   3 vessel  . EP IMPLANTABLE DEVICE N/A 04/27/2015   Procedure: ICD Generator Changeout;  Surgeon: Deboraha Sprang, MD;  Location: [place] [facility];  Service: Cardiovascular;  Laterality: N/A;  . KNEE CARTILAGE SURGERY  04/2014   Dr. Gladstone Lighter: torn meniscus repair  . PACEMAKER PLACEMENT  07/2006   St Jude current VRRF--generator change 03/2015  . POLYPECTOMY    . right carpal tunnel release     . SHOULDER OPEN ROTATOR CUFF REPAIR Left 01/28/2017   Procedure: Left shoulder open rotator cuff repair with graft and anchor;  Surgeon: Latanya Maudlin, MD;  Location: [facility];  Service: Orthopedics;  Laterality: Left;  Interscalene Block  . TOTAL KNEE ARTHROPLASTY Left 11/21/2015   Procedure: LEFT TOTAL KNEE ARTHROPLASTY;  Surgeon: Latanya Maudlin, MD;  Location: [facility];  Service: Orthopedics;  Laterality: Left;    Outpatient Medications Prior to Visit  Medication Sig Dispense Refill  . aspirin EC 81 MG tablet Take 81 mg by mouth daily.    [name] atorvastatin (LIPITOR) 20 MG tablet TAKE 1 TABLET BY MOUTH DAILY 90 tablet 1  . b complex vitamins tablet Take 1 tablet by mouth daily.    [name] CALCIUM PO Take 1 tablet by mouth daily.    [name] lisinopril (ZESTRIL) 5 MG tablet TAKE ONE TABLET BY MOUTH DAILY 90 tablet 1  . Menthol, Topical Analgesic, (BIOFREEZE EX) Apply 1 application topically daily as needed (for pain).     . metoprolol tartrate (LOPRESSOR) 25 MG tablet Take 1 tablet (25 mg total) by mouth 2 (two) times daily. 90 tablet 3  . Multiple Vitamin (MULTIVITAMIN) tablet Take 1 tablet by mouth daily.    . NON FORMULARY Take 1 tablet by mouth daily. OPC 3.  antioxidant protection    . pantoprazole (PROTONIX) 40 MG tablet TAKE 1 TABLET BY MOUTH DAILY 90 tablet 1  . vitamin C (ASCORBIC ACID) 500 MG tablet Take 500 mg by mouth daily.    [name] VITAMIN D PO  Take by mouth daily.    . Zinc Sulfate (ZINC 15 PO) Take by mouth.    . traMADol (ULTRAM) 50 MG tablet tramadol 50 mg tablet (Patient not taking: Reported on 05/30/2020)    . sitaGLIPtin (JANUVIA) 50 MG tablet Take 1 tablet (50 mg total) by mouth daily. (Patient not taking: Reported on 05/30/2020) 30 tablet 5  No facility-administered medications prior to visit.    Allergies  Allergen Reactions  . Naproxen Sodium Nausea Only    ROS As per HPI  PE: Vitals with BMI 05/30/2020 11/28/2019 11/24/2019  Height 6\' 0"  6\' 0"  6\' 0"   Weight 244 lbs 10 oz 260 lbs 3 oz 257 lbs 6 oz  BMI 33.17 37.29 02.1  Systolic 115 520 802  Diastolic 57 64 72  Pulse 77 73 60     Gen: Alert, well appearing.  Patient is oriented to person, place, time, and situation. AFFECT: pleasant, lucid thought and speech. CV: RRR, no m/r/g.   LUNGS: CTA bilat, nonlabored resps, good aeration in all lung fields. EXT: no clubbing or cyanosis.  1+ bilat LL pitting edema with some diffuse hyperpigmentation skin changes bilat pretibial regions.  No erythema or skin breakdown.    LABS:  Lab Results  Component Value Date   TSH 4.20 03/19/2010   Lab Results  Component Value Date   WBC 6.7 01/26/2017   HGB 13.0 01/26/2017   HCT 38.0 (L) 01/26/2017   MCV 92.2 01/26/2017   PLT 222 01/26/2017   Lab Results  Component Value Date   CREATININE 1.51 (H) 12/06/2019   BUN 19 12/06/2019   NA 139 12/06/2019   K 4.4 12/06/2019   CL 102 12/06/2019   CO2 29 12/06/2019   Lab Results  Component Value Date   ALT 24 06/14/2018   AST 20 06/14/2018   ALKPHOS 57 06/14/2018   BILITOT 0.9 06/14/2018   Lab Results  Component Value Date   CHOL 120 12/06/2019   Lab Results  Component Value Date   HDL 38.50 (L) 12/06/2019   Lab Results  Component Value Date   LDLCALC 58 12/06/2019   Lab Results  Component Value Date   TRIG 115.0 12/06/2019   Lab Results  Component Value Date   CHOLHDL 3 12/06/2019   Lab Results   Component Value Date   PSA 0.34 12/18/2011   PSA 0.43 03/24/2011   PSA 0.34 03/19/2010   Lab Results  Component Value Date   HGBA1C 7.8 (H) 12/06/2019   IMPRESSION AND PLAN:  1) DM 2; diet controlled. Good wt loss last 6 mo. Hba1c today.  2) HTN: well controlled on 5mg  lisinopril qd and lopressor 25mg  bid. Lytes/cr today.  3) HLD: tolerating atorva 20mg  qd. FLP and hepatic panel today.  4) CRI III: avoids NSAIDs and hydrates well.  5) Preventative health care: Prostate and colon ca screening-->no further needed. Vaccines: he declines covid vaccine b/c doesn't trust it, otherwise all vaccines utd.  An After Visit Summary was printed and given to the patient.  FOLLOW UP: Return in about 6 months (around 11/29/2020) for routine chronic illness f/u.  Signed:  Crissie Sickles, MD           05/30/2020

## 2020-05-30 ENCOUNTER — Encounter: Payer: Self-pay | Admitting: Family Medicine

## 2020-05-30 ENCOUNTER — Other Ambulatory Visit: Payer: Self-pay

## 2020-05-30 ENCOUNTER — Ambulatory Visit (INDEPENDENT_AMBULATORY_CARE_PROVIDER_SITE_OTHER): Payer: Medicare Other | Admitting: Family Medicine

## 2020-05-30 VITALS — BP 115/57 | HR 77 | Temp 97.9°F | Resp 16 | Ht 72.0 in | Wt 244.6 lb

## 2020-05-30 DIAGNOSIS — E7800 Pure hypercholesterolemia, unspecified: Secondary | ICD-10-CM | POA: Diagnosis not present

## 2020-05-30 DIAGNOSIS — N1831 Chronic kidney disease, stage 3a: Secondary | ICD-10-CM

## 2020-05-30 DIAGNOSIS — E119 Type 2 diabetes mellitus without complications: Secondary | ICD-10-CM

## 2020-05-30 DIAGNOSIS — I10 Essential (primary) hypertension: Secondary | ICD-10-CM

## 2020-05-30 LAB — COMPREHENSIVE METABOLIC PANEL
ALT: 42 U/L (ref 0–53)
AST: 34 U/L (ref 0–37)
Albumin: 4.1 g/dL (ref 3.5–5.2)
Alkaline Phosphatase: 56 U/L (ref 39–117)
BUN: 26 mg/dL — ABNORMAL HIGH (ref 6–23)
CO2: 24 mEq/L (ref 19–32)
Calcium: 9.5 mg/dL (ref 8.4–10.5)
Chloride: 102 mEq/L (ref 96–112)
Creatinine, Ser: 1.76 mg/dL — ABNORMAL HIGH (ref 0.40–1.50)
GFR: 35.6 mL/min — ABNORMAL LOW (ref >60.00)
Glucose, Bld: 137 mg/dL — ABNORMAL HIGH (ref 70–99)
Potassium: 4.3 mEq/L (ref 3.5–5.1)
Sodium: 136 mEq/L (ref 135–145)
Total Bilirubin: 0.9 mg/dL (ref 0.2–1.2)
Total Protein: 7.3 g/dL (ref 6.0–8.3)

## 2020-05-30 LAB — LIPID PANEL
Cholesterol: 110 mg/dL (ref 0–200)
HDL: 37.9 mg/dL — ABNORMAL LOW (ref >39.00)
LDL Cholesterol: 51 mg/dL (ref 0–99)
NonHDL: 71.71
Total CHOL/HDL Ratio: 3
Triglycerides: 106 mg/dL (ref 0.0–149.0)
VLDL: 21.2 mg/dL (ref 0.0–40.0)

## 2020-05-30 LAB — HEMOGLOBIN A1C: Hgb A1c MFr Bld: 7.6 % — ABNORMAL HIGH (ref 4.6–6.5)

## 2020-05-31 ENCOUNTER — Telehealth: Payer: Self-pay

## 2020-05-31 DIAGNOSIS — N1831 Chronic kidney disease, stage 3a: Secondary | ICD-10-CM

## 2020-05-31 NOTE — Telephone Encounter (Signed)
Spoke with pt regarding labs and instructions.   

## 2020-05-31 NOTE — Telephone Encounter (Signed)
-----   Message from Tammi Sou, MD sent at 05/30/2020  5:00 PM EDT ----- Hba1c improved a little to 7.6%.  I think he's ok to continue off of diabetes meds at this time. Kidney function down just a little from his baseline.  Nothing different to do at this time but I'd like him to get non-fasting bmet in 2 wks to make sure no ongoing downward trend, dx is chronic renal insufficiency stage 3.--thx

## 2020-06-12 ENCOUNTER — Other Ambulatory Visit: Payer: Self-pay

## 2020-06-12 ENCOUNTER — Ambulatory Visit (INDEPENDENT_AMBULATORY_CARE_PROVIDER_SITE_OTHER): Payer: Medicare Other

## 2020-06-12 DIAGNOSIS — N1831 Chronic kidney disease, stage 3a: Secondary | ICD-10-CM | POA: Diagnosis not present

## 2020-06-12 LAB — BASIC METABOLIC PANEL
BUN: 22 mg/dL (ref 6–23)
CO2: 26 mEq/L (ref 19–32)
Calcium: 9.3 mg/dL (ref 8.4–10.5)
Chloride: 104 mEq/L (ref 96–112)
Creatinine, Ser: 1.58 mg/dL — ABNORMAL HIGH (ref 0.40–1.50)
GFR: 40.52 mL/min — ABNORMAL LOW (ref >60.00)
Glucose, Bld: 136 mg/dL — ABNORMAL HIGH (ref 70–99)
Potassium: 4.4 mEq/L (ref 3.5–5.1)
Sodium: 138 mEq/L (ref 135–145)

## 2020-06-26 ENCOUNTER — Other Ambulatory Visit: Payer: Self-pay | Admitting: Family Medicine

## 2020-07-11 ENCOUNTER — Telehealth: Payer: Self-pay | Admitting: Family Medicine

## 2020-07-11 NOTE — Telephone Encounter (Signed)
Left message for patient to schedule Annual Wellness Visit.  Please schedule with Nurse Health Advisor Caroleen Hamman, RN at [facility].

## 2020-07-17 ENCOUNTER — Other Ambulatory Visit: Payer: Self-pay | Admitting: Family Medicine

## 2020-07-19 ENCOUNTER — Ambulatory Visit (INDEPENDENT_AMBULATORY_CARE_PROVIDER_SITE_OTHER): Payer: Medicare Other

## 2020-07-19 DIAGNOSIS — I255 Ischemic cardiomyopathy: Secondary | ICD-10-CM | POA: Diagnosis not present

## 2020-07-21 LAB — CUP PACEART REMOTE DEVICE CHECK
Battery Remaining Longevity: 59 mo
Battery Remaining Percentage: 57 %
Battery Voltage: 2.93 V
Brady Statistic RV Percent Paced: 1 %
Date Time Interrogation Session: 20220603134857
HighPow Impedance: 54 Ohm
HighPow Impedance: 55 Ohm
Implantable Lead Implant Date: 20080624
Implantable Lead Location: 753860
Implantable Lead Model: 7121
Implantable Pulse Generator Implant Date: 20170310
Lead Channel Impedance Value: 440 Ohm
Lead Channel Pacing Threshold Amplitude: 0.75 V
Lead Channel Pacing Threshold Pulse Width: 0.5 ms
Lead Channel Sensing Intrinsic Amplitude: 11.3 mV
Lead Channel Setting Pacing Amplitude: 2.5 V
Lead Channel Setting Pacing Pulse Width: 0.5 ms
Lead Channel Setting Sensing Sensitivity: 0.5 mV
Pulse Gen Serial Number: 7310877

## 2020-08-13 NOTE — Progress Notes (Signed)
Remote ICD transmission.   

## 2020-08-24 ENCOUNTER — Encounter: Payer: Self-pay | Admitting: Family Medicine

## 2020-09-17 DIAGNOSIS — U071 COVID-19: Secondary | ICD-10-CM

## 2020-09-17 HISTORY — DX: COVID-19: U07.1

## 2020-09-19 ENCOUNTER — Other Ambulatory Visit: Payer: Self-pay | Admitting: Family Medicine

## 2020-09-19 ENCOUNTER — Ambulatory Visit: Payer: Medicare Other

## 2020-09-19 ENCOUNTER — Encounter: Payer: Self-pay | Admitting: Family Medicine

## 2020-09-19 ENCOUNTER — Other Ambulatory Visit: Payer: Self-pay

## 2020-09-19 ENCOUNTER — Telehealth (INDEPENDENT_AMBULATORY_CARE_PROVIDER_SITE_OTHER): Payer: Medicare Other | Admitting: Family Medicine

## 2020-09-19 VITALS — Temp 98.6°F | Ht 72.0 in | Wt 239.0 lb

## 2020-09-19 DIAGNOSIS — I255 Ischemic cardiomyopathy: Secondary | ICD-10-CM | POA: Diagnosis not present

## 2020-09-19 DIAGNOSIS — Z20822 Contact with and (suspected) exposure to covid-19: Secondary | ICD-10-CM

## 2020-09-19 DIAGNOSIS — J069 Acute upper respiratory infection, unspecified: Secondary | ICD-10-CM

## 2020-09-19 LAB — POCT INFLUENZA A/B
Influenza A, POC: NEGATIVE
Influenza B, POC: NEGATIVE

## 2020-09-19 NOTE — Progress Notes (Signed)
Virtual Visit via Video Note  I connected with on 09/19/20 at  4:00 PM EDT by a video enabled telemedicine application and verified that I am speaking with the correct person using two identifiers.  Location patient: home, [place] Location provider:work or home office Persons participating in the virtual visit: patient, provider  I discussed the limitations of evaluation and management by telemedicine and the availability of in person appointments. The patient expressed understanding and agreed to proceed.  HPI: 85 y/o WM being seen today for sinus congestion, cough. Onset 2-3d ago, runny nose and cough, feeling tired and achy.  No fever.   No sob or wheezing. +Hx of exposure to people with covid recently--he was remodeling the home of a man who got covid and the man's wife did as well.    About 4 wks ago he had same sx's and states a zpack was effective.  ROS: See pertinent positives and negatives per HPI.  Past Medical History:  Diagnosis Date   AICD (automatic cardioverter/defibrillator) present    Anemia, unspecified    Arthritis    BPH with obstruction/lower urinary tract symptoms    Dr. Gaynelle Arabian.   CAD (coronary artery disease)    a. MI/CABG 1996. b. VF arrest 2008: cath with severe native 3v disease, 3/3 grafts widely patent, mild to moderate left ventricular systolic dysfunction secondary to anteroapical scar. EF 40-45%.   Cardiac arrest [facility]) 2008   2008: aborted cardiac arrest 2008 (c/b encephalopathy, VDRF, aspiriation PNA, CHF, elevated LFTs), s/p St. Jude ICD placement.   Chronic left ventricular systolic dysfunction    Ischemic CM   Chronic renal insufficiency, stage 3 (moderate) (HCC)    GFR 40s-50s   Chronic venous insufficiency    Diabetes mellitus without complication (HCC)    diet  controlled   Disorder of bone and cartilage, unspecified    Erectile dysfunction       "           "      "              "                  "     Essential hypertension    GERD  (gastroesophageal reflux disease)    Hx of adenomatous colonic polyps 2003, 2013   Pt declines any further colonoscopies as of 12/2016.   Hypercholesterolemia    Hypogonadism male    managed by [facility] (Dr. Gaynelle Arabian)   Ischemic cardiomyopathy    a. EF 40-45% by cath 2008.  EF 39% by nuclear study 2016   Meniscal injury Jan/Feb 2016   Sustained shoveling snow; repaired by Dr. Gladstone Lighter    Myocardial infarction [facility])    Obesity    OSA on CPAP    sleep study 2008, severe OSA: CPAP 11 cm H2O   Supraspinatus tendon tear 12/03/2016   Left shoulder.  Left shoulder open rotator cuff repair with graft and anchor (Left)--01/28/17.   Urinary tract infection    diagnosed on 11/13/2015     Past Surgical History:  Procedure Laterality Date   CARDIAC DEFIBRILLATOR PLACEMENT     CARDIOVASCULAR STRESS TEST  04/2014   High risk study: large scar, EF 39%, +WM abnl   COLONOSCOPY  06/10/11   One diminutive polyp removed (tubular adenoma, no high grade dysplasia).  Repeat colonoscopy 5 yrs (Dr. Ardis Hughs).   COLONOSCOPY W/ POLYPECTOMY  2003   Adenomatous; repeat 2006 was recommended but this  was not done until 06/10/2011 (by different GI MD).  Pt declines any further colonoscopies as of 12/2016.   CORONARY ARTERY BYPASS GRAFT  1996   3 vessel   EP IMPLANTABLE DEVICE N/A 04/27/2015   Procedure: ICD Generator Changeout;  Surgeon: Deboraha Sprang, MD;  Location: [place] [facility];  Service: Cardiovascular;  Laterality: N/A;   KNEE CARTILAGE SURGERY  04/2014   Dr. Gladstone Lighter: torn meniscus repair   PACEMAKER PLACEMENT  07/2006   St Jude current VRRF--generator change 03/2015   POLYPECTOMY     right carpal tunnel release      SHOULDER OPEN ROTATOR CUFF REPAIR Left 01/28/2017   Procedure: Left shoulder open rotator cuff repair with graft and anchor;  Surgeon: Latanya Maudlin, MD;  Location: [facility];  Service: Orthopedics;  Laterality: Left;  Interscalene Block   TOTAL KNEE ARTHROPLASTY Left 11/21/2015    Procedure: LEFT TOTAL KNEE ARTHROPLASTY;  Surgeon: Latanya Maudlin, MD;  Location: [facility];  Service: Orthopedics;  Laterality: Left;     Current Outpatient Medications:    aspirin EC 81 MG tablet, Take 81 mg by mouth daily., Disp: , Rfl:    atorvastatin (LIPITOR) 20 MG tablet, TAKE ONE TABLET BY MOUTH DAILY, Disp: 90 tablet, Rfl: 0   b complex vitamins tablet, Take 1 tablet by mouth daily., Disp: , Rfl:    CALCIUM PO, Take 1 tablet by mouth daily., Disp: , Rfl:    lisinopril (ZESTRIL) 5 MG tablet, TAKE ONE TABLET BY MOUTH DAILY, Disp: 90 tablet, Rfl: 0   Menthol, Topical Analgesic, (BIOFREEZE EX), Apply 1 application topically daily as needed (for pain). , Disp: , Rfl:    metoprolol tartrate (LOPRESSOR) 25 MG tablet, TAKE ONE TABLET BY MOUTH TWICE A DAY, Disp: 180 tablet, Rfl: 1   Multiple Vitamin (MULTIVITAMIN) tablet, Take 1 tablet by mouth daily., Disp: , Rfl:    NON FORMULARY, Take 1 tablet by mouth daily. OPC 3.  antioxidant protection, Disp: , Rfl:    pantoprazole (PROTONIX) 40 MG tablet, TAKE ONE TABLET BY MOUTH DAILY, Disp: 90 tablet, Rfl: 0   vitamin C (ASCORBIC ACID) 500 MG tablet, Take 500 mg by mouth daily., Disp: , Rfl:    VITAMIN D PO, Take by mouth daily., Disp: , Rfl:    Zinc Sulfate (ZINC 15 PO), Take by mouth., Disp: , Rfl:   EXAM:  VITALS per patient if applicable:  Vitals with BMI 09/19/2020 05/30/2020 11/28/2019  Height '6\' 0"'$  '6\' 0"'$  '6\' 0"'$   Weight 239 lbs 244 lbs 10 oz 260 lbs 3 oz  BMI 32.41 [id number]  Systolic - [id number]  Diastolic - 57 64  Pulse - 77 73     GENERAL: alert, oriented, appears well and in no acute distress  HEENT: atraumatic, conjunttiva clear, no obvious abnormalities on inspection of external nose and ears  NECK: normal movements of the head and neck  LUNGS: on inspection no signs of respiratory distress, breathing rate appears normal, no obvious gross SOB, gasping or wheezing  CV: no obvious cyanosis  MS: moves all visible extremities  without noticeable abnormality  PSYCH/NEURO: pleasant and cooperative, no obvious depression or anxiety, speech and thought processing grossly intact  LABS: none today    Chemistry      Component Value Date/Time   NA 138 06/12/2020 0949   K 4.4 06/12/2020 0949   CL 104 06/12/2020 0949   CO2 26 06/12/2020 0949   BUN 22 06/12/2020 0949   CREATININE 1.58 (H)  06/12/2020 0949   CREATININE 1.34 (H) 04/23/2015 1512      Component Value Date/Time   CALCIUM 9.3 06/12/2020 0949   ALKPHOS 56 05/30/2020 0826   AST 34 05/30/2020 0826   ALT 42 05/30/2020 0826   BILITOT 0.9 05/30/2020 0826     Lab Results  Component Value Date   HGBA1C 7.6 (H) 05/30/2020   ASSESSMENT AND PLAN:  Discussed the following assessment and plan:  Viral URI with cough, +exposure to covid infection. Symptomatic care plus pt will come today for covid pcr and flu testing. If pt has +covid and he's still in the 5 day window then I'll rx molnupiravir (Pt with CRI III (GFR 40s).   I discussed the assessment and treatment plan with the patient. The patient was provided an opportunity to ask questions and all were answered. The patient agreed with the plan and demonstrated an understanding of the instructions.   F/u: if not improving signif in 3-5 d or if worsening prior  Signed:  Crissie Sickles, MD           09/19/2020

## 2020-09-20 LAB — SARS-COV-2, NAA 2 DAY TAT

## 2020-09-20 LAB — NOVEL CORONAVIRUS, NAA: SARS-CoV-2, NAA: DETECTED — AB

## 2020-09-21 ENCOUNTER — Other Ambulatory Visit: Payer: Self-pay | Admitting: Family Medicine

## 2020-09-21 ENCOUNTER — Encounter: Payer: Self-pay | Admitting: Family Medicine

## 2020-09-21 MED ORDER — MOLNUPIRAVIR EUA 200MG CAPSULE: 4.0000 | ORAL_CAPSULE | Freq: Two times a day (BID) | ORAL | 0 refills | Status: AC

## 2020-10-03 ENCOUNTER — Ambulatory Visit (INDEPENDENT_AMBULATORY_CARE_PROVIDER_SITE_OTHER): Payer: Medicare Other | Admitting: *Deleted

## 2020-10-03 DIAGNOSIS — Z0000 Encounter for general adult medical examination without abnormal findings: Secondary | ICD-10-CM

## 2020-10-03 NOTE — Progress Notes (Signed)
Subjective:   [name] is a 85 y.o. male who presents for Medicare Annual/Subsequent preventive examination.  I connected with  Myers Botz on 10/03/20 by a video enabled telemedicine application and verified that I am speaking with the correct person using two identifiers.   I discussed the limitations of evaluation and management by telemedicine. The patient expressed understanding and agreed to proceed.   Review of Systems    NA Cardiac Risk Factors include: advanced age (>62mn, >>82women);male gender;hypertension     Objective:    Today's Vitals   There is no height or weight on file to calculate BMI.  Advanced Directives 10/03/2020 01/28/2017 01/26/2017 12/26/2016 11/21/2015 11/12/2015 04/27/2015  Does Patient Have a Medical Advance Directive? Yes Yes Yes Yes Yes Yes Yes  Type of AAcademic librarianLiving will;Healthcare Power of Attorney Living will;Healthcare Power of APorumLiving will - Living will;Healthcare Power of ABeatriceLiving will  Does patient want to make changes to medical advance directive? - No - Patient declined - - - No - Patient declined No - Patient declined  Copy of HAngelinain Chart? No - copy requested No - copy requested No - copy requested No - copy requested No - copy requested No - copy requested No - copy requested    Current Medications (verified) Outpatient Encounter Medications as of 10/03/2020  Medication Sig   aspirin EC 81 MG tablet Take 81 mg by mouth daily.   atorvastatin (LIPITOR) 20 MG tablet TAKE ONE TABLET BY MOUTH DAILY   b complex vitamins tablet Take 1 tablet by mouth daily.   CALCIUM PO Take 1 tablet by mouth daily.   lisinopril (ZESTRIL) 5 MG tablet TAKE ONE TABLET BY MOUTH DAILY   Menthol, Topical Analgesic, (BIOFREEZE EX) Apply 1 application topically daily as needed (for pain).    metoprolol  tartrate (LOPRESSOR) 25 MG tablet TAKE ONE TABLET BY MOUTH TWICE A DAY   Multiple Vitamin (MULTIVITAMIN) tablet Take 1 tablet by mouth daily.   NON FORMULARY Take 1 tablet by mouth daily. OPC 3.  antioxidant protection   pantoprazole (PROTONIX) 40 MG tablet TAKE ONE TABLET BY MOUTH DAILY   vitamin C (ASCORBIC ACID) 500 MG tablet Take 500 mg by mouth daily.   VITAMIN D PO Take by mouth daily.   Zinc Sulfate (ZINC 15 PO) Take by mouth.   [DISCONTINUED] Calcium Carbonate (CALCIUM 500 PO) Take 1 tablet by mouth daily.   No facility-administered encounter medications on file as of 10/03/2020.    Allergies (verified) Naproxen sodium   History: Past Medical History:  Diagnosis Date   AICD (automatic cardioverter/defibrillator) present    Anemia, unspecified    Arthritis    BPH with obstruction/lower urinary tract symptoms    Dr. TGaynelle Arabian   CAD (coronary artery disease)    a. MI/CABG 1996. b. VF arrest 2008: cath with severe native 3v disease, 3/3 grafts widely patent, mild to moderate left ventricular systolic dysfunction secondary to anteroapical scar. EF 40-45%.   Cardiac arrest ([facility] 2008   2008: aborted cardiac arrest 2008 (c/b encephalopathy, VDRF, aspiriation PNA, CHF, elevated LFTs), s/p St. Jude ICD placement.   Chronic left ventricular systolic dysfunction    Ischemic CM   Chronic renal insufficiency, stage 3 (moderate) (HCC)    GFR 40s-50s   Chronic venous insufficiency    COVID-19 virus infection 09/2020   molnupiravir   Diabetes mellitus without complication (  [place])    diet  controlled   Disorder of bone and cartilage, unspecified    Erectile dysfunction       "           "      "              "                  "     Essential hypertension    GERD (gastroesophageal reflux disease)    Hx of adenomatous colonic polyps 2003, 2013   Pt declines any further colonoscopies as of 12/2016.   Hypercholesterolemia    Hypogonadism male    managed by [facility] (Dr.  Gaynelle Arabian)   Ischemic cardiomyopathy    a. EF 40-45% by cath 2008.  EF 39% by nuclear study 2016   Meniscal injury Jan/Feb 2016   Sustained shoveling snow; repaired by Dr. Gladstone Lighter    Myocardial infarction [facility])    Obesity    OSA on CPAP    sleep study 2008, severe OSA: CPAP 11 cm H2O   Supraspinatus tendon tear 12/03/2016   Left shoulder.  Left shoulder open rotator cuff repair with graft and anchor (Left)--01/28/17.   Urinary tract infection    diagnosed on 11/13/2015    Past Surgical History:  Procedure Laterality Date   CARDIAC DEFIBRILLATOR PLACEMENT     CARDIOVASCULAR STRESS TEST  04/2014   High risk study: large scar, EF 39%, +WM abnl   COLONOSCOPY  06/10/11   One diminutive polyp removed (tubular adenoma, no high grade dysplasia).  Repeat colonoscopy 5 yrs (Dr. Ardis Hughs).   COLONOSCOPY W/ POLYPECTOMY  2003   Adenomatous; repeat 2006 was recommended but this was not done until 06/10/2011 (by different GI MD).  Pt declines any further colonoscopies as of 12/2016.   CORONARY ARTERY BYPASS GRAFT  1996   3 vessel   EP IMPLANTABLE DEVICE N/A 04/27/2015   Procedure: ICD Generator Changeout;  Surgeon: Deboraha Sprang, MD;  Location: [place] [facility];  Service: Cardiovascular;  Laterality: N/A;   KNEE CARTILAGE SURGERY  04/2014   Dr. Gladstone Lighter: torn meniscus repair   PACEMAKER PLACEMENT  07/2006   St Jude current VRRF--generator change 03/2015   POLYPECTOMY     right carpal tunnel release      SHOULDER OPEN ROTATOR CUFF REPAIR Left 01/28/2017   Procedure: Left shoulder open rotator cuff repair with graft and anchor;  Surgeon: Latanya Maudlin, MD;  Location: [facility];  Service: Orthopedics;  Laterality: Left;  Interscalene Block   TOTAL KNEE ARTHROPLASTY Left 11/21/2015   Procedure: LEFT TOTAL KNEE ARTHROPLASTY;  Surgeon: Latanya Maudlin, MD;  Location: [facility];  Service: Orthopedics;  Laterality: Left;   Family History  Problem Relation Age of Onset   Heart disease Mother    Stroke Mother     Heart disease Father    Colon cancer Neg Hx    Esophageal cancer Neg Hx    Stomach cancer Neg Hx    Rectal cancer Neg Hx    Social History   Socioeconomic History   Marital status: Married    Spouse name: Not on file   Number of children: Not on file   Years of education: Not on file   Highest education level: Not on file  Occupational History   Not on file  Tobacco Use   Smoking status: Former    Types: Cigarettes    Quit date: 02/17/1970  Years since quitting: 50.6   Smokeless tobacco: Never  Vaping Use   Vaping Use: Never used  Substance and Sexual Activity   Alcohol use: Yes    Alcohol/week: 7.0 standard drinks    Types: 7 Glasses of wine per week    Comment: drinks wine daily    Drug use: No   Sexual activity: Not on file  Other Topics Concern   Not on file  Social History Narrative   Married, 2 daughters, 4 grandchildren.   Retired Producer, television/film/video for [employer] [phone]).   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   Not sedentary, but has no formal exercise regimen.   Ex-smoker, quit in his 37s.  Drinks one glass of wine daily.   No drug use.   Social Determinants of Health   Financial Resource Strain: Low Risk    Difficulty of Paying Living Expenses: Not hard at all  Food Insecurity: No Food Insecurity   Worried About Charity fundraiser in the Last Year: Never true   [place] in the Last Year: Never true  Transportation Needs: No Transportation Needs   Lack of Transportation (Medical): No   Lack of Transportation (Non-Medical): No  Physical Activity: Insufficiently Active   Days of Exercise per Week: 2 days   Minutes of Exercise per Session: 20 min  Stress: No Stress Concern Present   Feeling of Stress : Not at all  Social Connections: Socially Integrated   Frequency of Communication with Friends and Family: More than three times a week   Frequency of Social Gatherings with Friends and Family: More than three times a week   Attends Religious Services:  More than 4 times per year   Active Member of [employer] or Organizations: Yes   Attends Music therapist: More than 4 times per year   Marital Status: Married    Tobacco Counseling Counseling given: Not Answered   Clinical Intake:  Pre-visit preparation completed: Yes  Pain : No/denies pain     Nutritional Risks: None Diabetes: No CBG done?: No Did pt. bring in CBG monitor from home?: No  How often do you need to have someone help you when you read instructions, pamphlets, or other written materials from your doctor or pharmacy?: 1 - Never  Diabetic?  yes  Interpreter Needed?: No  Information entered by :: Leroy Kennedy LPN   Activities of Daily Living In your present state of health, do you have any difficulty performing the following activities: 10/03/2020 11/28/2019  Hearing? Y N  Vision? N N  Difficulty concentrating or making decisions? N N  Walking or climbing stairs? N N  Dressing or bathing? N N  Doing errands, shopping? N N  Preparing Food and eating ? N -  Using the Toilet? N -  In the past six months, have you accidently leaked urine? N -  Do you have problems with loss of bowel control? N -  Managing your Medications? N -  Managing your Finances? N -  Housekeeping or managing your Housekeeping? N -  Some recent data might be hidden    Patient Care Team: Tammi Sou, MD as PCP - General (Family Medicine) Milus Banister, MD as Consulting Physician (Gastroenterology) Linus Mako, MD as Consulting Physician (Family Medicine) Deboraha Sprang, MD as Consulting Physician (Cardiology) Camillo Flaming, OD as Referring Physician (Optometry) Latanya Maudlin, MD as Consulting Physician (Orthopedic Surgery) Deneise Lever, MD as Consulting Physician (Pulmonary Disease)  Indicate any recent  Medical Services you may have received from other than [facility] providers in the past year (date may be approximate).     Assessment:   This is a  routine wellness examination for [facility].  Hearing/Vision screen Hearing Screening - Comments:: Hearing aids both ears Vision Screening - Comments:: Up to date Dr. Katy Fitch  Dietary issues and exercise activities discussed: Current Exercise Habits: Home exercise routine, Type of exercise: walking, Time (Minutes): 20, Frequency (Times/Week): 3, Weekly Exercise (Minutes/Week): 60, Intensity: Mild   Goals Addressed             This Visit's Progress    Patient Stated       Increase physical activity       Depression Screen PHQ 2/9 Scores 10/03/2020 05/30/2020 01/20/2019 12/07/2017 12/26/2016 07/31/2015 07/21/2014  PHQ - 2 Score 0 0 0 0 0 0 0    Fall Risk Fall Risk  10/03/2020 05/30/2020 11/28/2019 12/07/2017 01/22/2017  Falls in the past year? 0 0 1 No No  Comment - - - - Emmi Telephone Survey: data to providers prior to load  Number falls in past yr: 0 0 1 - -  Injury with Fall? 0 0 1 - -  Follow up Falls evaluation completed;Falls prevention discussed Falls evaluation completed Falls evaluation completed - -    FALL RISK PREVENTION PERTAINING TO THE HOME:  Any stairs in or around the home? Yes  If so, are there any without handrails? Yes  Home free of loose throw rugs in walkways, pet beds, electrical cords, etc? Yes  Adequate lighting in your home to reduce risk of falls? Yes   ASSISTIVE DEVICES UTILIZED TO PREVENT FALLS:  Life alert? No  Use of a cane, walker or w/c? No  Grab bars in the bathroom? Yes  Shower chair or bench in shower? No  Elevated toilet seat or a handicapped toilet? Yes   TIMED UP AND GO:  Was the test performed? No .    Cognitive Function:  Normal cognitive status assessed by direct observation by this Nurse Health Advisor. No abnormalities found.        Immunizations Immunization History  Administered Date(s) Administered   Influenza Whole 12/07/2007, 01/18/2009   Influenza, High Dose Seasonal PF 11/27/2014, 10/31/2015   Influenza,inj,Quad PF,6+  Mos 11/18/2013   Influenza-Unspecified 12/17/2016, 11/09/2017   Pneumococcal Conjugate-13 03/14/2013   Pneumococcal Polysaccharide-23 01/28/2006   Td 02/17/2002   Tdap 10/08/2011, 05/09/2012   Zoster Recombinat (Shingrix) 01/22/2017   Zoster, Live 09/16/2007    TDAP status: Up to date  Flu Vaccine status: Due, Education has been provided regarding the importance of this vaccine. Advised may receive this vaccine at local pharmacy or Health Dept. Aware to provide a copy of the vaccination record if obtained from local pharmacy or Health Dept. Verbalized acceptance and understanding.  Pneumococcal vaccine status: Up to date  Covid-19 vaccine status: Information provided on how to obtain vaccines.   Qualifies for Shingles Vaccine? Yes   Zostavax completed Yes   Shingrix Completed?: No.    Education has been provided regarding the importance of this vaccine. Patient has been advised to call insurance company to determine out of pocket expense if they have not yet received this vaccine. Advised may also receive vaccine at local pharmacy or Health Dept. Verbalized acceptance and understanding.  Screening Tests Health Maintenance  Topic Date Due   COLONOSCOPY (Pts 45-37yr Insurance coverage will need to be confirmed)  06/09/2016   Zoster Vaccines- Shingrix (2 of 2) 03/19/2017  OPHTHALMOLOGY EXAM  06/17/2017   INFLUENZA VACCINE  09/17/2020   COVID-19 Vaccine (1) 10/05/2020 (Originally 01/20/1941)   FOOT EXAM  11/27/2020   HEMOGLOBIN A1C  11/29/2020   TETANUS/TDAP  05/10/2022   PNA vac Low Risk Adult  Completed   HPV VACCINES  Aged Out    Health Maintenance  Health Maintenance Due  Topic Date Due   COLONOSCOPY (Pts 45-8yr Insurance coverage will need to be confirmed)  06/09/2016   Zoster Vaccines- Shingrix (2 of 2) 03/19/2017   OPHTHALMOLOGY EXAM  06/17/2017   INFLUENZA VACCINE  09/17/2020    Colorectal cancer screening: No longer required.   Lung Cancer Screening: (Low Dose  CT Chest recommended if Age 111-80years, 30 pack-year currently smoking OR have quit w/in 15years.) does not qualify.   Lung Cancer Screening Referral:   Additional Screening:  Hepatitis C Screening: does not qualify  Vision Screening: Recommended annual ophthalmology exams for early detection of glaucoma and other disorders of the eye. Is the patient up to date with their annual eye exam?  Yes  Who is the provider or what is the name of the office in which the patient attends annual eye exams? Dr. GKaty FitchIf pt is not established with a provider, would they like to be referred to a provider to establish care? No .   Dental Screening: Recommended annual dental exams for proper oral hygiene  Community Resource Referral / Chronic Care Management: CRR required this visit?  No   CCM required this visit?  No      Plan:     I have personally reviewed and noted the following in the patient's chart:   Medical and social history Use of alcohol, tobacco or illicit drugs  Current medications and supplements including opioid prescriptions. Patient is not currently taking opioid prescriptions. Functional ability and status Nutritional status Physical activity Advanced directives List of other physicians Hospitalizations, surgeries, and ER visits in previous 12 months Vitals Screenings to include cognitive, depression, and falls Referrals and appointments  In addition, I have reviewed and discussed with patient certain preventive protocols, quality metrics, and best practice recommendations. A written personalized care plan for preventive services as well as general preventive health recommendations were provided to patient.     JLeroy Kennedy LPN   [id number]  Nurse Notes: na

## 2020-10-03 NOTE — Patient Instructions (Signed)
[name] , Thank you for taking time to come for your Medicare Wellness Visit. I appreciate your ongoing commitment to your health goals. Please review the following plan we discussed and let me know if I can assist you in the future.   Screening recommendations/referrals: Colonoscopy: no longer required Recommended yearly ophthalmology/optometry visit for glaucoma screening and checkup Recommended yearly dental visit for hygiene and checkup  Vaccinations: Influenza vaccine: education provided Pneumococcal vaccine: up to date Tdap vaccine: up to date Shingles vaccine: Education provided    Advanced directives: copy requested  Conditions/risks identified:   Next appointment: 11-29-2020 2 8:00 Dr. Anitra Lauth  Preventive Care 83 Years and Older, Male Preventive care refers to lifestyle choices and visits with your health care provider that can promote health and wellness. What does preventive care include? A yearly physical exam. This is also called an annual well check. Dental exams once or twice a year. Routine eye exams. Ask your health care provider how often you should have your eyes checked. Personal lifestyle choices, including: Daily care of your teeth and gums. Regular physical activity. Eating a healthy diet. Avoiding tobacco and drug use. Limiting alcohol use. Practicing safe sex. Taking low doses of aspirin every day. Taking vitamin and mineral supplements as recommended by your health care provider. What happens during an annual well check? The services and screenings done by your health care provider during your annual well check will depend on your age, overall health, lifestyle risk factors, and family history of disease. Counseling  Your health care provider may ask you questions about your: Alcohol use. Tobacco use. Drug use. Emotional well-being. Home and relationship well-being. Sexual activity. Eating habits. History of falls. Memory and ability to  understand (cognition). Work and work Statistician. Screening  You may have the following tests or measurements: Height, weight, and BMI. Blood pressure. Lipid and cholesterol levels. These may be checked every 5 years, or more frequently if you are over 83 years old. Skin check. Lung cancer screening. You may have this screening every year starting at age 83 if you have a 30-pack-year history of smoking and currently smoke or have quit within the past 15 years. Fecal occult blood test (FOBT) of the stool. You may have this test every year starting at age 83. Flexible sigmoidoscopy or colonoscopy. You may have a sigmoidoscopy every 5 years or a colonoscopy every 10 years starting at age 83. Prostate cancer screening. Recommendations will vary depending on your family history and other risks. Hepatitis C blood test. Hepatitis B blood test. Sexually transmitted disease (STD) testing. Diabetes screening. This is done by checking your blood sugar (glucose) after you have not eaten for a while (fasting). You may have this done every 1-3 years. Abdominal aortic aneurysm (AAA) screening. You may need this if you are a current or former smoker. Osteoporosis. You may be screened starting at age 31 if you are at high risk. Talk with your health care provider about your test results, treatment options, and if necessary, the need for more tests. Vaccines  Your health care provider may recommend certain vaccines, such as: Influenza vaccine. This is recommended every year. Tetanus, diphtheria, and acellular pertussis (Tdap, Td) vaccine. You may need a Td booster every 10 years. Zoster vaccine. You may need this after age 43. Pneumococcal 13-valent conjugate (PCV13) vaccine. One dose is recommended after age 20. Pneumococcal polysaccharide (PPSV23) vaccine. One dose is recommended after age 83. Talk to your health care provider about which screenings and vaccines  you need and how often you need them. This  information is not intended to replace advice given to you by your health care provider. Make sure you discuss any questions you have with your health care provider. Document Released: 03/02/2015 Document Revised: 10/24/2015 Document Reviewed: 12/05/2014 Elsevier Interactive Patient Education  2017 [place] Prevention in the Home Falls can cause injuries. They can happen to people of all ages. There are many things you can do to make your home safe and to help prevent falls. What can I do on the outside of my home? Regularly fix the edges of walkways and driveways and fix any cracks. Remove anything that might make you trip as you walk through a door, such as a raised step or threshold. Trim any bushes or trees on the path to your home. Use bright outdoor lighting. Clear any walking paths of anything that might make someone trip, such as rocks or tools. Regularly check to see if handrails are loose or broken. Make sure that both sides of any steps have handrails. Any raised decks and porches should have guardrails on the edges. Have any leaves, snow, or ice cleared regularly. Use sand or salt on walking paths during winter. Clean up any spills in your garage right away. This includes oil or grease spills. What can I do in the bathroom? Use night lights. Install grab bars by the toilet and in the tub and shower. Do not use towel bars as grab bars. Use non-skid mats or decals in the tub or shower. If you need to sit down in the shower, use a plastic, non-slip stool. Keep the floor dry. Clean up any water that spills on the floor as soon as it happens. Remove soap buildup in the tub or shower regularly. Attach bath mats securely with double-sided non-slip rug tape. Do not have throw rugs and other things on the floor that can make you trip. What can I do in the bedroom? Use night lights. Make sure that you have a light by your bed that is easy to reach. Do not use any sheets or  blankets that are too big for your bed. They should not hang down onto the floor. Have a firm chair that has side arms. You can use this for support while you get dressed. Do not have throw rugs and other things on the floor that can make you trip. What can I do in the kitchen? Clean up any spills right away. Avoid walking on wet floors. Keep items that you use a lot in easy-to-reach places. If you need to reach something above you, use a strong step stool that has a grab bar. Keep electrical cords out of the way. Do not use floor polish or wax that makes floors slippery. If you must use wax, use non-skid floor wax. Do not have throw rugs and other things on the floor that can make you trip. What can I do with my stairs? Do not leave any items on the stairs. Make sure that there are handrails on both sides of the stairs and use them. Fix handrails that are broken or loose. Make sure that handrails are as long as the stairways. Check any carpeting to make sure that it is firmly attached to the stairs. Fix any carpet that is loose or worn. Avoid having throw rugs at the top or bottom of the stairs. If you do have throw rugs, attach them to the floor with carpet tape. Make sure  that you have a light switch at the top of the stairs and the bottom of the stairs. If you do not have them, ask someone to add them for you. What else can I do to help prevent falls? Wear shoes that: Do not have high heels. Have rubber bottoms. Are comfortable and fit you well. Are closed at the toe. Do not wear sandals. If you use a stepladder: Make sure that it is fully opened. Do not climb a closed stepladder. Make sure that both sides of the stepladder are locked into place. Ask someone to hold it for you, if possible. Clearly mark and make sure that you can see: Any grab bars or handrails. First and last steps. Where the edge of each step is. Use tools that help you move around (mobility aids) if they are  needed. These include: Canes. Walkers. Scooters. Crutches. Turn on the lights when you go into a dark area. Replace any light bulbs as soon as they burn out. Set up your furniture so you have a clear path. Avoid moving your furniture around. If any of your floors are uneven, fix them. If there are any pets around you, be aware of where they are. Review your medicines with your doctor. Some medicines can make you feel dizzy. This can increase your chance of falling. Ask your doctor what other things that you can do to help prevent falls. This information is not intended to replace advice given to you by your health care provider. Make sure you discuss any questions you have with your health care provider. Document Released: 11/30/2008 Document Revised: 07/12/2015 Document Reviewed: 03/10/2014 Elsevier Interactive Patient Education  2017 [employer].

## 2020-10-07 NOTE — Progress Notes (Signed)
Electrophysiology Office Note Date: 10/08/2020  ID:  Richard, [name] [birth year], MRN [id number]  PCP: Tammi Sou, MD Primary Cardiologist: None Electrophysiologist: Virl Axe, MD   CC: Routine ICD follow-up  [name] is a 85 y.o. male seen today for Virl Axe, MD for routine electrophysiology followup.  Since last being seen in our clinic the patient reports doing very well.  he denies chest pain, palpitations, dyspnea, PND, orthopnea, nausea, vomiting, dizziness, syncope, edema, weight gain, or early satiety. He has not had ICD shocks.   Device History: STJ single chamber ICD implanted 2008 for ICM; gen change 2017 History of appropriate therapy: No History of AAD therapy: No  Past Medical History:  Diagnosis Date   AICD (automatic cardioverter/defibrillator) present    Anemia, unspecified    Arthritis    BPH with obstruction/lower urinary tract symptoms    Dr. Gaynelle Arabian.   CAD (coronary artery disease)    a. MI/CABG 1996. b. VF arrest 2008: cath with severe native 3v disease, 3/3 grafts widely patent, mild to moderate left ventricular systolic dysfunction secondary to anteroapical scar. EF 40-45%.   Cardiac arrest [facility]) 2008   2008: aborted cardiac arrest 2008 (c/b encephalopathy, VDRF, aspiriation PNA, CHF, elevated LFTs), s/p St. Jude ICD placement.   Chronic left ventricular systolic dysfunction    Ischemic CM   Chronic renal insufficiency, stage 3 (moderate) (HCC)    GFR 40s-50s   Chronic venous insufficiency    COVID-19 virus infection 09/2020   molnupiravir   Diabetes mellitus without complication (HCC)    diet  controlled   Disorder of bone and cartilage, unspecified    Erectile dysfunction       "           "      "              "                  "     Essential hypertension    GERD (gastroesophageal reflux disease)    Hx of adenomatous colonic polyps 2003, 2013   Pt declines any further colonoscopies as of 12/2016.    Hypercholesterolemia    Hypogonadism male    [facility] by [facility] (Dr. Gaynelle Arabian)   Ischemic cardiomyopathy    a. EF 40-45% by cath 2008.  EF 39% by nuclear study 2016   Meniscal injury Jan/Feb 2016   Sustained shoveling snow; repaired by Dr. Gladstone Lighter    Myocardial infarction [facility])    Obesity    OSA on CPAP    sleep study 2008, severe OSA: CPAP 11 cm H2O   Supraspinatus tendon tear 12/03/2016   Left shoulder.  Left shoulder open rotator cuff repair with graft and anchor (Left)--01/28/17.   Urinary tract infection    diagnosed on 11/13/2015    Past Surgical History:  Procedure Laterality Date   CARDIAC DEFIBRILLATOR PLACEMENT     CARDIOVASCULAR STRESS TEST  04/2014   High risk study: large scar, EF 39%, +WM abnl   COLONOSCOPY  06/10/11   One diminutive polyp removed (tubular adenoma, no high grade dysplasia).  Repeat colonoscopy 5 yrs (Dr. Ardis Hughs).   COLONOSCOPY W/ POLYPECTOMY  2003   Adenomatous; repeat 2006 was recommended but this was not done until 06/10/2011 (by different GI MD).  Pt declines any further colonoscopies as of 12/2016.   CORONARY ARTERY BYPASS GRAFT  1996   3 vessel   EP IMPLANTABLE DEVICE  N/A 04/27/2015   Procedure: ICD Generator Changeout;  Surgeon: Deboraha Sprang, MD;  Location: [facility];  Service: Cardiovascular;  Laterality: N/A;   KNEE CARTILAGE SURGERY  04/2014   Dr. Gladstone Lighter: torn meniscus repair   PACEMAKER PLACEMENT  07/2006   St Jude current VRRF--generator change 03/2015   POLYPECTOMY     right carpal tunnel release      SHOULDER OPEN ROTATOR CUFF REPAIR Left 01/28/2017   Procedure: Left shoulder open rotator cuff repair with graft and anchor;  Surgeon: Latanya Maudlin, MD;  Location: [facility];  Service: Orthopedics;  Laterality: Left;  Interscalene Block   TOTAL KNEE ARTHROPLASTY Left 11/21/2015   Procedure: LEFT TOTAL KNEE ARTHROPLASTY;  Surgeon: Latanya Maudlin, MD;  [facility]: [facility];  Service: Orthopedics;  Laterality: Left;    Current  Outpatient Medications  Medication Sig Dispense Refill   aspirin EC 81 MG tablet Take 81 mg by mouth daily.     atorvastatin (LIPITOR) 20 MG tablet TAKE ONE TABLET BY MOUTH DAILY 90 tablet 0   b complex vitamins tablet Take 1 tablet by mouth daily.     CALCIUM PO Take 1 tablet by mouth daily.     hydroxychloroquine (PLAQUENIL) 200 MG tablet Take 200 mg by mouth daily.     ivermectin (STROMECTOL) 3 MG TABS tablet Take 12 mg by mouth daily.     lisinopril (ZESTRIL) 5 MG tablet TAKE ONE TABLET BY MOUTH DAILY 90 tablet 0   Menthol, Topical Analgesic, (BIOFREEZE EX) Apply 1 application topically daily as needed (for pain).      metoprolol tartrate (LOPRESSOR) 25 MG tablet TAKE ONE TABLET BY MOUTH TWICE A DAY 180 tablet 1   Multiple Vitamin (MULTIVITAMIN) tablet Take 1 tablet by mouth daily.     NON FORMULARY Take 1 tablet by mouth daily. OPC 3.  antioxidant protection     pantoprazole (PROTONIX) 40 MG tablet TAKE ONE TABLET BY MOUTH DAILY 90 tablet 0   vitamin C (ASCORBIC ACID) 500 MG tablet Take 500 mg by mouth daily.     VITAMIN D PO Take by mouth daily.     Zinc Sulfate (ZINC 15 PO) Take by mouth.     No current facility-administered medications for this visit.    Allergies:   Naproxen sodium   Social History: Social History   Socioeconomic History   Marital status: Married    Spouse name: Not on file   Number of children: Not on file   Years of education: Not on file   Highest education level: Not on file  Occupational History   Not on file  Tobacco Use   Smoking status: Former    Types: Cigarettes    Quit date: 02/17/1970    Years since quitting: 50.6   Smokeless tobacco: Never  Vaping Use   Vaping Use: Never used  Substance and Sexual Activity   Alcohol use: Yes    Alcohol/week: 7.0 standard drinks    Types: 7 Glasses of wine per week    Comment: drinks wine daily    Drug use: No   Sexual activity: Not on file  Other Topics Concern   Not on file  Social History  Narrative   Married, 2 daughters, 4 grandchildren.   Retired Producer, television/film/video for [employer] [phone]).   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   Not sedentary, but has no formal exercise regimen.   Ex-smoker, quit in his 77s.  Drinks one glass of wine daily.  No drug use.   Social Determinants of Health   Financial Resource Strain: Low Risk    Difficulty of Paying Living Expenses: Not hard at all  Food Insecurity: No Food Insecurity   Worried About Charity fundraiser in the Last Year: Never true   [place] in the Last Year: Never true  Transportation Needs: No Transportation Needs   Lack of Transportation (Medical): No   Lack of Transportation (Non-Medical): No  Physical Activity: Insufficiently Active   Days of Exercise per Week: 2 days   Minutes of Exercise per Session: 20 min  Stress: No Stress Concern Present   Feeling of Stress : Not at all  Social Connections: Socially Integrated   Frequency of Communication with Friends and Family: More than three times a week   Frequency of Social Gatherings with Friends and Family: More than three times a week   Attends Religious Services: More than 4 times per year   Active Member of [employer] or Organizations: Yes   Attends Music therapist: More than 4 times per year   Marital Status: Married  Human resources officer Violence: Not At Risk   Fear of Current or Ex-Partner: No   Emotionally Abused: No   Physically Abused: No   Sexually Abused: No    Family History: Family History  Problem Relation Age of Onset   Heart disease Mother    Stroke Mother    Heart disease Father    Colon cancer Neg Hx    Esophageal cancer Neg Hx    Stomach cancer Neg Hx    Rectal cancer Neg Hx     Review of Systems: All other systems reviewed and are otherwise negative except as noted above.   Physical Exam: Vitals:   10/08/20 1038  BP: 102/60  Pulse: 65  SpO2: 95%  Weight: 239 lb (108.4 kg)  Height: 6' (1.829 m)     GEN- The  patient is well appearing, alert and oriented x 3 today.   HEENT: normocephalic, atraumatic; sclera clear, conjunctiva pink; hearing intact; oropharynx clear; neck supple, no JVP Lymph- no cervical lymphadenopathy Lungs- Clear to ausculation bilaterally, normal work of breathing.  No wheezes, rales, rhonchi Heart- Regular rate and rhythm, no murmurs, rubs or gallops, PMI not laterally displaced GI- soft, non-tender, non-distended, bowel sounds present, no hepatosplenomegaly Extremities- no clubbing or cyanosis. No edema; DP/PT/radial pulses 2+ bilaterally MS- no significant deformity or atrophy Skin- warm and dry, no rash or lesion; ICD pocket well healed Psych- euthymic mood, full affect Neuro- strength and sensation are intact  ICD interrogation- reviewed in detail today,  See PACEART report  EKG:  EKG is ordered today. Personal review of EKG ordered today shows NSR at 65 bpm, IVCD at 134 ms  Recent Labs: 05/30/2020: ALT 42 06/12/2020: BUN 22; Creatinine, Ser 1.58; Potassium 4.4; Sodium 138   Wt Readings from Last 3 Encounters:  10/08/20 239 lb (108.4 kg)  09/19/20 239 lb (108.4 kg)  05/30/20 244 lb 9.6 oz (110.9 kg)     Other studies Reviewed: Additional studies/ records that were reviewed today include: Previous EP office notes . Echo    Assessment and Plan:  1.  Chronic systolic dysfunction s/p St. Jude single chamber ICD  NYHA II symptoms euvolemic today Stable on current medical regimen Normal ICD function See Pace Art report No changes today He has not had an echo in years, nor has he been tried on Entresto or spiro that I can see.  Had planned to order echo and plan from there, but he is moving to [place], [place] this weekend.  IVCD. Not clear if he would benefit from CRT consideration, especially given length of follow up since last Echo   2. CAD/ICM Continue medical therapy with ASA/statin/BB He needs general cards follow up for  continued management of these as well as optimization of #1. Recommended he follow up in [place].   Disposition:   Follow up with [place] as needed. We will continue to receive his transmissions until he establishes in [facility].   Jacalyn Lefevre, PA-C  10/08/2020 11:02 AM  [facility] [address] [place] [address] [phone] (office) [phone] (fax)

## 2020-10-08 ENCOUNTER — Other Ambulatory Visit: Payer: Self-pay

## 2020-10-08 ENCOUNTER — Ambulatory Visit (INDEPENDENT_AMBULATORY_CARE_PROVIDER_SITE_OTHER): Payer: Medicare Other | Admitting: Student

## 2020-10-08 ENCOUNTER — Encounter: Payer: Self-pay | Admitting: Student

## 2020-10-08 VITALS — BP 102/60 | HR 65 | Ht 72.0 in | Wt 239.0 lb

## 2020-10-08 DIAGNOSIS — I472 Ventricular tachycardia, unspecified: Secondary | ICD-10-CM

## 2020-10-08 DIAGNOSIS — Z95810 Presence of automatic (implantable) cardiac defibrillator: Secondary | ICD-10-CM | POA: Diagnosis not present

## 2020-10-08 DIAGNOSIS — I255 Ischemic cardiomyopathy: Secondary | ICD-10-CM

## 2020-10-08 LAB — CUP PACEART INCLINIC DEVICE CHECK
Battery Remaining Longevity: 58 mo
Brady Statistic RV Percent Paced: 0 %
Date Time Interrogation Session: 20220822110142
HighPow Impedance: 49.6593
Implantable Lead Implant Date: 20080624
Implantable Lead Location: 753860
Implantable Lead Model: 7121
Implantable Pulse Generator Implant Date: 20170310
Lead Channel Impedance Value: 425 Ohm
Lead Channel Pacing Threshold Amplitude: 0.75 V
Lead Channel Pacing Threshold Amplitude: 0.75 V
Lead Channel Pacing Threshold Pulse Width: 0.5 ms
Lead Channel Pacing Threshold Pulse Width: 0.5 ms
Lead Channel Sensing Intrinsic Amplitude: 11.3 mV
Lead Channel Setting Pacing Amplitude: 2.5 V
Lead Channel Setting Pacing Pulse Width: 0.5 ms
Lead Channel Setting Sensing Sensitivity: 0.5 mV
Pulse Gen Serial Number: 7310877

## 2020-10-18 ENCOUNTER — Ambulatory Visit (INDEPENDENT_AMBULATORY_CARE_PROVIDER_SITE_OTHER): Payer: Medicare Other

## 2020-10-18 DIAGNOSIS — I255 Ischemic cardiomyopathy: Secondary | ICD-10-CM | POA: Diagnosis not present

## 2020-10-23 LAB — CUP PACEART REMOTE DEVICE CHECK
Battery Remaining Longevity: 56 mo
Battery Remaining Percentage: 55 %
Battery Voltage: 2.93 V
Brady Statistic RV Percent Paced: 1 %
Date Time Interrogation Session: 20220901020020
HighPow Impedance: 47 Ohm
HighPow Impedance: 48 Ohm
Implantable Lead Implant Date: 20080624
Implantable Lead Location: 753860
Implantable Lead Model: 7121
Implantable Pulse Generator Implant Date: 20170310
Lead Channel Impedance Value: 400 Ohm
Lead Channel Pacing Threshold Amplitude: 0.75 V
Lead Channel Pacing Threshold Pulse Width: 0.5 ms
Lead Channel Sensing Intrinsic Amplitude: 11.3 mV
Lead Channel Setting Pacing Amplitude: 2.5 V
Lead Channel Setting Pacing Pulse Width: 0.5 ms
Lead Channel Setting Sensing Sensitivity: 0.5 mV
Pulse Gen Serial Number: 7310877

## 2020-10-30 NOTE — Progress Notes (Signed)
Remote ICD transmission.   

## 2020-11-22 ENCOUNTER — Telehealth: Payer: Self-pay | Admitting: Family Medicine

## 2020-11-22 MED ORDER — ATORVASTATIN CALCIUM 20 MG PO TABS: 20.0000 mg | ORAL_TABLET | Freq: Every day | ORAL | 0 refills | Status: DC

## 2020-11-22 MED ORDER — PANTOPRAZOLE SODIUM 40 MG PO TBEC: 40.0000 mg | DELAYED_RELEASE_TABLET | Freq: Every day | ORAL | 0 refills | Status: DC

## 2020-11-22 MED ORDER — LISINOPRIL 5 MG PO TABS: 5.0000 mg | ORAL_TABLET | Freq: Every day | ORAL | 0 refills | Status: DC

## 2020-11-22 NOTE — Telephone Encounter (Signed)
Please review and advise.

## 2020-11-22 NOTE — Telephone Encounter (Signed)
In the interim pt is needing medication refills.   NEXT APPOINTMENT DATE:11/29/2020  MEDICATION: out of all 3 Pantoprazole Atorvastatin Lisinopril  Pt wife was advised to have the metoprolol transferred from [facility] to the pharmacy requested since pt has 1 refill on it.  PHARMACY: [id number] [place], [place] Phone:  [phone]  Fax:  [phone]

## 2020-11-22 NOTE — Telephone Encounter (Signed)
Called patient wife to get appt set up and LVM for them to call back

## 2020-11-22 NOTE — Telephone Encounter (Signed)
TOC appointment was made for Dec. 12.

## 2020-11-22 NOTE — Telephone Encounter (Signed)
Patient and his wife have moved. Patients wife is stay with Charlett Blake and they will travel to and from for the appts  They would like to [facility] to someone at [facility] office  Are we okay to [facility] from [place] to [place]? Patient would like to stay with a male

## 2020-11-22 NOTE — Telephone Encounter (Signed)
Ok with me to transfer. OK to RF all rx's for 90d supply, no RFs.

## 2020-11-29 ENCOUNTER — Ambulatory Visit: Payer: Medicare Other | Admitting: Family Medicine

## 2020-12-27 ENCOUNTER — Other Ambulatory Visit: Payer: Self-pay | Admitting: Family Medicine

## 2021-01-17 ENCOUNTER — Ambulatory Visit (INDEPENDENT_AMBULATORY_CARE_PROVIDER_SITE_OTHER): Payer: Medicare Other

## 2021-01-17 DIAGNOSIS — I255 Ischemic cardiomyopathy: Secondary | ICD-10-CM

## 2021-01-17 LAB — CUP PACEART REMOTE DEVICE CHECK
Battery Remaining Longevity: 54 mo
Battery Remaining Percentage: 53 %
Battery Voltage: 2.92 V
Brady Statistic RV Percent Paced: 1 %
Date Time Interrogation Session: 20221201020020
HighPow Impedance: 48 Ohm
HighPow Impedance: 48 Ohm
Implantable Lead Implant Date: 20080624
Implantable Lead Location: 753860
Implantable Lead Model: 7121
Implantable Pulse Generator Implant Date: 20170310
Lead Channel Impedance Value: 400 Ohm
Lead Channel Pacing Threshold Amplitude: 0.75 V
Lead Channel Pacing Threshold Pulse Width: 0.5 ms
Lead Channel Sensing Intrinsic Amplitude: 11.3 mV
Lead Channel Setting Pacing Amplitude: 2.5 V
Lead Channel Setting Pacing Pulse Width: 0.5 ms
Lead Channel Setting Sensing Sensitivity: 0.5 mV
Pulse Gen Serial Number: 7310877

## 2021-01-28 ENCOUNTER — Other Ambulatory Visit: Payer: Self-pay | Admitting: Family Medicine

## 2021-01-29 ENCOUNTER — Ambulatory Visit (INDEPENDENT_AMBULATORY_CARE_PROVIDER_SITE_OTHER): Payer: Medicare Other | Admitting: Medical

## 2021-01-29 ENCOUNTER — Encounter: Payer: Self-pay | Admitting: Medical

## 2021-01-29 VITALS — BP 132/78 | HR 91 | Temp 98.2°F | Resp 18 | Ht 72.0 in | Wt 248.0 lb

## 2021-01-29 DIAGNOSIS — I10 Essential (primary) hypertension: Secondary | ICD-10-CM

## 2021-01-29 DIAGNOSIS — R7989 Other specified abnormal findings of blood chemistry: Secondary | ICD-10-CM

## 2021-01-29 DIAGNOSIS — M79605 Pain in left leg: Secondary | ICD-10-CM

## 2021-01-29 DIAGNOSIS — I255 Ischemic cardiomyopathy: Secondary | ICD-10-CM

## 2021-01-29 DIAGNOSIS — N1831 Chronic kidney disease, stage 3a: Secondary | ICD-10-CM | POA: Diagnosis not present

## 2021-01-29 DIAGNOSIS — M79604 Pain in right leg: Secondary | ICD-10-CM

## 2021-01-29 DIAGNOSIS — E119 Type 2 diabetes mellitus without complications: Secondary | ICD-10-CM | POA: Diagnosis not present

## 2021-01-29 DIAGNOSIS — Z23 Encounter for immunization: Secondary | ICD-10-CM

## 2021-01-29 NOTE — Progress Notes (Signed)
Remote ICD transmission.   

## 2021-01-29 NOTE — Progress Notes (Signed)
Subjective:    Patient ID: [name], male    DOB: 1935/10/17, 85 y.o.   MRN: [id number]  HPI  Pt in for first time. Pt wants to establish with me. Pt moved to [facility] at end of August. He is just [place] minutes from [facility]. His goal is to come in once a year. This is more convenient location as his wife come here.  Pt states he wants a1c. Last a1c was 7.6. Pt not on any meds for diabetes.   He also has some pain in his vessels. Hx of procedures done on VV. He states for month has had pain in leg medial aspect/distal calf. On left side.   Pt has htn. He is on lisinopril 5 mg daily. Alo on metoprolol 25 mg twice daily  High cholesterol history-  he is on atorvatatin 20 mg daily.  Jerrye Bushy- pt is on protonix and controlled with this.  Review of Systems  Constitutional:  Negative for chills, fatigue and fever.  HENT:  Negative for congestion, drooling, ear pain and facial swelling.   Respiratory:  Negative for cough, chest tightness, shortness of breath and wheezing.   Cardiovascular:  Negative for chest pain and palpitations.  Gastrointestinal:  Negative for abdominal pain, blood in stool, diarrhea, nausea and vomiting.  Genitourinary:  Negative for difficulty urinating, enuresis, flank pain and frequency.  Musculoskeletal:  Negative for back pain, joint swelling, neck pain and neck stiffness.  Skin:  Negative for rash.    Past Medical History:  Diagnosis Date   AICD (automatic cardioverter/defibrillator) present    Anemia, unspecified    Arthritis    BPH with obstruction/lower urinary tract symptoms    Dr. Gaynelle Arabian.   CAD (coronary artery disease)    a. MI/CABG 1996. b. VF arrest 2008: cath with severe native 3v disease, 3/3 grafts widely patent, mild to moderate left ventricular systolic dysfunction secondary to anteroapical scar. EF 40-45%.   Cardiac arrest [facility]) 2008   2008: aborted cardiac arrest 2008 (c/b encephalopathy, VDRF, aspiriation PNA, CHF, elevated LFTs), s/p St.  Jude ICD placement.   Chronic left ventricular systolic dysfunction    Ischemic CM   Chronic renal insufficiency, stage 3 (moderate) (HCC)    GFR 40s-50s   Chronic venous insufficiency    COVID-19 virus infection 09/2020   molnupiravir   Diabetes mellitus without complication (HCC)    diet  controlled   Disorder of bone and cartilage, unspecified    Erectile dysfunction       "           "      "              "                  "     Essential hypertension    GERD (gastroesophageal reflux disease)    Hx of adenomatous colonic polyps 2003, 2013   Pt declines any further colonoscopies as of 12/2016.   Hypercholesterolemia    Hypogonadism male    [facility] by [facility] (Dr. Gaynelle Arabian)   Ischemic cardiomyopathy    a. EF 40-45% by cath 2008.  EF 39% by nuclear study 2016   Meniscal injury Jan/Feb 2016   Sustained shoveling snow; repaired by Dr. Gladstone Lighter    Myocardial infarction [facility])    Obesity    OSA on CPAP    sleep study 2008, severe OSA: CPAP 11 cm H2O   Supraspinatus tendon tear 12/03/2016  Left shoulder.  Left shoulder open rotator cuff repair with graft and anchor (Left)--01/28/17.   Urinary tract infection    diagnosed on 11/13/2015      Social History   Socioeconomic History   Marital status: Married    Spouse name: Not on file   Number of children: Not on file   Years of education: Not on file   Highest education level: Not on file  Occupational History   Not on file  Tobacco Use   Smoking status: Former    Types: Cigarettes    Quit date: 02/17/1970    Years since quitting: 50.9   Smokeless tobacco: Never  Vaping Use   Vaping Use: Never used  Substance and Sexual Activity   Alcohol use: Yes    Alcohol/week: 7.0 standard drinks    Types: 7 Glasses of wine per week    Comment: drinks wine daily    Drug use: No   Sexual activity: Not on file  Other Topics Concern   Not on file  Social History Narrative   Married, 2 daughters, 4 grandchildren.    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   Not sedentary, but has no formal exercise regimen.   Ex-smoker, quit in his 86s.  Drinks one glass of wine daily.   No drug use.   Social Determinants of Health   Financial Resource Strain: Low Risk    Difficulty of Paying Living Expenses: Not hard at all  Food Insecurity: No Food Insecurity   Worried About Charity fundraiser in the Last Year: Never true   [place] in the Last Year: Never true  Transportation Needs: No Transportation Needs   Lack of Transportation (Medical): No   Lack of Transportation (Non-Medical): No  Physical Activity: Insufficiently Active   Days of Exercise per Week: 2 days   Minutes of Exercise per Session: 20 min  Stress: No Stress Concern Present   Feeling of Stress : Not at all  Social Connections: Socially Integrated   Frequency of Communication with Friends and Family: More than three times a week   Frequency of Social Gatherings with Friends and Family: More than three times a week   Attends Religious Services: More than 4 times per year   Active Member of [employer] or Organizations: Yes   Attends Music therapist: More than 4 times per year   Marital Status: Married  Human resources officer Violence: Not At Risk   Fear of Current or Ex-Partner: No   Emotionally Abused: No   Physically Abused: No   Sexually Abused: No    Past Surgical History:  Procedure Laterality Date   CARDIAC DEFIBRILLATOR PLACEMENT     CARDIOVASCULAR STRESS TEST  04/2014   High risk study: large scar, EF 39%, +WM abnl   COLONOSCOPY  06/10/11   One diminutive polyp removed (tubular adenoma, no high grade dysplasia).  Repeat colonoscopy 5 yrs (Dr. Ardis Hughs).   COLONOSCOPY W/ POLYPECTOMY  2003   Adenomatous; repeat 2006 was recommended but this was not done until 06/10/2011 (by different GI MD).  Pt declines any further colonoscopies as of 12/2016.   CORONARY ARTERY BYPASS GRAFT  1996    3 vessel   EP IMPLANTABLE DEVICE N/A 04/27/2015   Procedure: ICD Generator Changeout;  Surgeon: Deboraha Sprang, MD;  Location: [facility];  Service: Cardiovascular;  Laterality: N/A;   KNEE CARTILAGE SURGERY  04/2014   Dr.  Gioffre: torn meniscus repair   PACEMAKER PLACEMENT  07/2006   St Jude current VRRF--generator change 03/2015   POLYPECTOMY     right carpal tunnel release      SHOULDER OPEN ROTATOR CUFF REPAIR Left 01/28/2017   Procedure: Left shoulder open rotator cuff repair with graft and anchor;  Surgeon: Latanya Maudlin, MD;  [facility];  Service: Orthopedics;  Laterality: Left;  Interscalene Block   TOTAL KNEE ARTHROPLASTY Left 11/21/2015   Procedure: LEFT TOTAL KNEE ARTHROPLASTY;  Surgeon: Latanya Maudlin, MD;  [facility];  Service: Orthopedics;  Laterality: Left;    Family History  Problem Relation Age of Onset   Heart disease Mother    Stroke Mother    Heart disease Father    Colon cancer Neg Hx    Esophageal cancer Neg Hx    Stomach cancer Neg Hx    Rectal cancer Neg Hx     Allergies  Allergen Reactions   Naproxen Sodium Nausea Only    Current Outpatient Medications on File Prior to Visit  Medication Sig Dispense Refill   aspirin EC 81 MG tablet Take 81 mg by mouth daily.     atorvastatin (LIPITOR) 20 MG tablet TAKE 1 TABLET BY MOUTH EVERY DAY 30 tablet 0   b complex vitamins tablet Take 1 tablet by mouth daily.     CALCIUM PO Take 1 tablet by mouth daily.     hydroxychloroquine (PLAQUENIL) 200 MG tablet Take 200 mg by mouth daily.     ivermectin (STROMECTOL) 3 MG TABS tablet Take 12 mg by mouth daily.     lisinopril (ZESTRIL) 5 MG tablet Take 1 tablet (5 mg total) by mouth daily. 90 tablet 0   Menthol, Topical Analgesic, (BIOFREEZE EX) Apply 1 application topically daily as needed (for pain).      metoprolol tartrate (LOPRESSOR) 25 MG tablet TAKE ONE TABLET BY MOUTH TWICE A DAY 180 tablet 1   Multiple Vitamin (MULTIVITAMIN) tablet Take 1  tablet by mouth daily.     NON FORMULARY Take 1 tablet by mouth daily. OPC 3.  antioxidant protection     pantoprazole (PROTONIX) 40 MG tablet TAKE 1 TABLET BY MOUTH EVERY DAY 30 tablet 0   vitamin C (ASCORBIC ACID) 500 MG tablet Take 500 mg by mouth daily.     VITAMIN D PO Take by mouth daily.     Zinc Sulfate (ZINC 15 PO) Take by mouth.     No current facility-administered medications on file prior to visit.    BP 132/78    Pulse 91    Temp 98.2 F (36.8 C)    Resp 18    Ht 6' (1.829 m)    Wt 248 lb (112.5 kg)    SpO2 96%    BMI 33.63 kg/m        Objective:   Physical Exam  General Mental Status- Alert. General Appearance- Not in acute distress.   Skin General: Color- Normal Color. Moisture- Normal Moisture.  Neck Carotid Arteries- Normal color. Moisture- Normal Moisture. No carotid bruits. No JVD.  Chest and Lung Exam Auscultation: Breath Sounds:-Normal.  Cardiovascular Auscultation:Rythm- Regular. Murmurs & Other Heart Sounds:Auscultation of the heart reveals- No Murmurs.  Abdomen Inspection:-Inspeection Normal. Palpation/Percussion:Note:No mass. Palpation and Percussion of the abdomen reveal- Non Tender, Non Distended + BS, no rebound or guarding.    Neurologic Cranial Nerve exam:- CN III-XII intact(No nystagmus), symmetric smile. Strength:- 5/5 equal and symmetric strength both upper and lower extremities.  Rt lower ext- hyperpigmentation and scare present. Mild tender of distal pretibial area.  Left  lower ext- medial calf hyperpigmentation and feel like tortuous vein in taht area but not well seen.    Assessment & Plan:   Patient Instructions  Hypertension-blood pressure very well controlled today.  Continue lisinopril and metoprolol.  Hyperlipidemia-continue atorvastatin.  We will get metabolic panel and lipid panel today.  GERD-well-controlled on Protonix.  Recommend continuing as well as eating healthy diet.  Diabetes with last A1c in the 7.5  range.  We will repeat A1c today.  Hopefully A1c closer to 7.  Continue with low sugar diet.  Bilateral distal calf pain for months.  Little worse on the left side than the right side.  You do have stasis dermatitis type changes.  On the left side he feels like he might have slightly tortuous/twisted varicose vein.  We will get ultrasound of both lower extremities want to see if he might have superficial phlebitis or DVT.  Studies do not show a superficial or deep vein abnormality then consider prescription of Keflex for skin infection.  We will update you when studies are back.  Moving to [place].  Recommended to do establish primary care there.  Also you might need to find a good local urgent care to be seen as needed.  If you have a minor issue then you could do a virtual visit from [place]  Sometimes on virtual visits we determine that she actually need to be seen by a local provider.  Follow-up as needed.   Mackie Pai, PA-C   Time spent with patient today was 40  minutes which consisted of chart review, discussing diagnosis, work up treatment and documentation.

## 2021-01-29 NOTE — Patient Instructions (Addendum)
Hypertension-blood pressure very well controlled today.  Continue lisinopril and metoprolol.  Hyperlipidemia-continue atorvastatin.  We will get metabolic panel and lipid panel today.  GERD-well-controlled on Protonix.  Recommend continuing as well as eating healthy diet.  Diabetes with last A1c in the 7.5 range.  We will repeat A1c today.  Hopefully A1c closer to 7.  Continue with low sugar diet.  Bilateral distal calf pain for months.  Little worse on the left side than the right side.  You do have stasis dermatitis type changes.  On the left side  feels like he might have slightly tortuous/twisted varicose vein.  We will get ultrasound of both lower extremities want to see if he might have superficial phlebitis or DVT.  Studies do not show a superficial or deep vein abnormality then consider prescription of Keflex for skin infection.  We will update you when studies are back.  Note after [place] review decided on doxycycline.   Moving to [place].  Recommended to do establish primary care there.  Also you might need to find a good local urgent care to be seen as needed.  If you have a minor issue then you could do a virtual visit from [place]  Sometimes on virtual visits we determine that she actually need to be seen by a local provider.  Follow-up as needed.

## 2021-01-30 ENCOUNTER — Ambulatory Visit (HOSPITAL_BASED_OUTPATIENT_CLINIC_OR_DEPARTMENT_OTHER)
Admission: RE | Admit: 2021-01-30 | Discharge: 2021-01-30 | Disposition: A | Discharge status: Home / Self Care | Payer: Medicare Other | Source: Ambulatory Visit | Attending: Medical | Admitting: Medical

## 2021-01-30 ENCOUNTER — Other Ambulatory Visit: Payer: Self-pay

## 2021-01-30 DIAGNOSIS — M79604 Pain in right leg: Secondary | ICD-10-CM | POA: Diagnosis present

## 2021-01-30 DIAGNOSIS — I10 Essential (primary) hypertension: Secondary | ICD-10-CM | POA: Diagnosis not present

## 2021-01-30 LAB — CBC WITH DIFFERENTIAL/PLATELET
Basophils Absolute: 0.1 10*3/uL (ref 0.0–0.1)
Basophils Relative: 1.3 % (ref 0.0–3.0)
Eosinophils Absolute: 0.1 10*3/uL (ref 0.0–0.7)
Eosinophils Relative: 1.4 % (ref 0.0–5.0)
HCT: 38.4 % — ABNORMAL LOW (ref 39.0–52.0)
Hemoglobin: 12.6 g/dL — ABNORMAL LOW (ref 13.0–17.0)
Lymphocytes Relative: 27 % (ref 12.0–46.0)
Lymphs Abs: 2.2 10*3/uL (ref 0.7–4.0)
MCHC: 32.7 g/dL (ref 30.0–36.0)
MCV: 92.6 fl (ref 78.0–100.0)
Monocytes Absolute: 0.9 10*3/uL (ref 0.1–1.0)
Monocytes Relative: 10.7 % (ref 3.0–12.0)
Neutro Abs: 4.7 10*3/uL (ref 1.4–7.7)
Neutrophils Relative %: 59.6 % (ref 43.0–77.0)
Platelets: 176 10*3/uL (ref 150.0–400.0)
RBC: 4.15 Mil/uL — ABNORMAL LOW (ref 4.22–5.81)
RDW: 13.3 % (ref 11.5–15.5)
WBC: 8 10*3/uL (ref 4.0–10.5)

## 2021-01-30 LAB — COMPREHENSIVE METABOLIC PANEL
ALT: 19 U/L (ref 0–53)
AST: 20 U/L (ref 0–37)
Albumin: 4.3 g/dL (ref 3.5–5.2)
Alkaline Phosphatase: 48 U/L (ref 39–117)
BUN: 27 mg/dL — ABNORMAL HIGH (ref 6–23)
CO2: 29 mEq/L (ref 19–32)
Calcium: 9.5 mg/dL (ref 8.4–10.5)
Chloride: 106 mEq/L (ref 96–112)
Creatinine, Ser: 1.69 mg/dL — ABNORMAL HIGH (ref 0.40–1.50)
GFR: 37.21 mL/min — ABNORMAL LOW (ref >60.00)
Glucose, Bld: 136 mg/dL — ABNORMAL HIGH (ref 70–99)
Potassium: 4.8 mEq/L (ref 3.5–5.1)
Sodium: 142 mEq/L (ref 135–145)
Total Bilirubin: 0.5 mg/dL (ref 0.2–1.2)
Total Protein: 7.2 g/dL (ref 6.0–8.3)

## 2021-01-30 LAB — LIPID PANEL
Cholesterol: 108 mg/dL (ref 0–200)
HDL: 47.9 mg/dL (ref >39.00)
LDL Cholesterol: 41 mg/dL (ref 0–99)
NonHDL: 60.28
Total CHOL/HDL Ratio: 2
Triglycerides: 95 mg/dL (ref 0.0–149.0)
VLDL: 19 mg/dL (ref 0.0–40.0)

## 2021-01-30 LAB — HEMOGLOBIN A1C: Hgb A1c MFr Bld: 7 % — ABNORMAL HIGH (ref 4.6–6.5)

## 2021-01-30 MED ORDER — DOXYCYCLINE HYCLATE 100 MG PO TABS: 100.0000 mg | ORAL_TABLET | Freq: Two times a day (BID) | ORAL | 0 refills | Status: AC

## 2021-01-30 NOTE — Addendum Note (Signed)
Addended by: Anabel Halon on: 01/30/2021 10:53 AM   Modules accepted: Orders

## 2021-01-31 ENCOUNTER — Telehealth: Payer: Self-pay | Admitting: Internal Medicine

## 2021-01-31 NOTE — Telephone Encounter (Signed)
Patient would like to see if Dr. Caryl Comes knows of any EP Providers in or around [place], [place].   Advised I will forward to Dr. Caryl Comes.  Shock plan reviewed with patient with verbal understanding.

## 2021-01-31 NOTE — Telephone Encounter (Signed)
Patient has moved to [employer], [place]. He wants to know what he should do if his defib should ever go off.  Where he should go to?

## 2021-02-04 NOTE — Telephone Encounter (Signed)
Spoke with pt and advised per Dr Caryl Comes after consulting with colleagues he was not given the name of a specific provider in his area bu [facility] was recommended.   Pt advised to establish care with a local PCP and request referral to EP cardiology for pt's ICD monitoring.  Pt verbalizes understanding and agrees with current plan.

## 2021-02-05 ENCOUNTER — Other Ambulatory Visit: Payer: Self-pay | Admitting: Family Medicine

## 2021-02-06 ENCOUNTER — Telehealth: Payer: Self-pay | Admitting: Medical

## 2021-02-06 MED ORDER — PANTOPRAZOLE SODIUM 40 MG PO TBEC: 40.0000 mg | DELAYED_RELEASE_TABLET | Freq: Every day | ORAL | 0 refills | Status: DC

## 2021-02-06 NOTE — Telephone Encounter (Signed)
Refill of protonix sent to pharmacy.

## 2021-04-08 ENCOUNTER — Telehealth: Payer: Self-pay | Admitting: Family Medicine

## 2021-04-08 ENCOUNTER — Other Ambulatory Visit: Payer: Self-pay | Admitting: Family Medicine

## 2021-04-08 MED ORDER — LISINOPRIL 5 MG PO TABS: 5.0000 mg | ORAL_TABLET | Freq: Every day | ORAL | 0 refills | Status: AC

## 2021-04-08 NOTE — Telephone Encounter (Signed)
Spoke with pt and he said he saw Mackie Pai PA-C once(01/29/21) due to Dr.McGowen not having availability. He still considers Dr.McGowen his PCP until he establishes with someone in [place]. He would like to get a refill for lisinopril to [facility].  Please fill, if appropriate. Med pending

## 2021-04-08 NOTE — Telephone Encounter (Signed)
I can do a 90 d supply but he needs to establish with a local primary care provider after that.-thx

## 2021-04-08 NOTE — Telephone Encounter (Signed)
Pt called about med refill Informed him I could send a note back, I can't guarantee anything.  lisinopril lisinopril (ZESTRIL) 5 MG tablet   [id number] - [place], [address] Phone:  [phone]  Fax:  [phone]

## 2021-04-09 NOTE — Telephone Encounter (Signed)
Pt advised regarding refill. He will hopefully be established with someone by [facility] March.

## 2021-04-18 ENCOUNTER — Ambulatory Visit (INDEPENDENT_AMBULATORY_CARE_PROVIDER_SITE_OTHER): Payer: Medicare Other

## 2021-04-18 DIAGNOSIS — I255 Ischemic cardiomyopathy: Secondary | ICD-10-CM

## 2021-04-18 LAB — CUP PACEART REMOTE DEVICE CHECK
Battery Remaining Longevity: 53 mo
Battery Remaining Percentage: 52 %
Battery Voltage: 2.92 V
Brady Statistic RV Percent Paced: 1 %
Date Time Interrogation Session: 20230302020015
HighPow Impedance: 46 Ohm
HighPow Impedance: 46 Ohm
Implantable Lead Implant Date: 20080624
Implantable Lead Location: 753860
Implantable Lead Model: 7121
Implantable Pulse Generator Implant Date: 20170310
Lead Channel Impedance Value: 390 Ohm
Lead Channel Pacing Threshold Amplitude: 0.75 V
Lead Channel Pacing Threshold Pulse Width: 0.5 ms
Lead Channel Sensing Intrinsic Amplitude: 11.3 mV
Lead Channel Setting Pacing Amplitude: 2.5 V
Lead Channel Setting Pacing Pulse Width: 0.5 ms
Lead Channel Setting Sensing Sensitivity: 0.5 mV
Pulse Gen Serial Number: 7310877

## 2021-04-25 NOTE — Progress Notes (Signed)
Remote ICD transmission.   

## 2021-05-06 NOTE — Telephone Encounter (Signed)
ERROR

## 2021-05-20 ENCOUNTER — Other Ambulatory Visit: Payer: Self-pay | Admitting: Medical

## 2021-07-18 ENCOUNTER — Ambulatory Visit (INDEPENDENT_AMBULATORY_CARE_PROVIDER_SITE_OTHER): Payer: Medicare Other

## 2021-07-18 DIAGNOSIS — I255 Ischemic cardiomyopathy: Secondary | ICD-10-CM

## 2021-07-18 LAB — CUP PACEART REMOTE DEVICE CHECK
Battery Remaining Longevity: 50 mo
Battery Remaining Percentage: 50 %
Battery Voltage: 2.92 V
Brady Statistic RV Percent Paced: 1 %
Date Time Interrogation Session: 20230601020018
HighPow Impedance: 43 Ohm
HighPow Impedance: 43 Ohm
Implantable Lead Implant Date: 20080624
Implantable Lead Location: 753860
Implantable Lead Model: 7121
Implantable Pulse Generator Implant Date: 20170310
Lead Channel Impedance Value: 390 Ohm
Lead Channel Pacing Threshold Amplitude: 0.75 V
Lead Channel Pacing Threshold Pulse Width: 0.5 ms
Lead Channel Sensing Intrinsic Amplitude: 11.3 mV
Lead Channel Setting Pacing Amplitude: 2.5 V
Lead Channel Setting Pacing Pulse Width: 0.5 ms
Lead Channel Setting Sensing Sensitivity: 0.5 mV
Pulse Gen Serial Number: 7310877

## 2021-07-26 NOTE — Progress Notes (Signed)
Remote ICD transmission.   

## 2021-08-24 ENCOUNTER — Other Ambulatory Visit: Payer: Self-pay | Admitting: Medical

## 2021-10-17 ENCOUNTER — Ambulatory Visit (INDEPENDENT_AMBULATORY_CARE_PROVIDER_SITE_OTHER): Payer: Medicare Other

## 2021-10-17 DIAGNOSIS — I255 Ischemic cardiomyopathy: Secondary | ICD-10-CM

## 2021-10-17 LAB — CUP PACEART REMOTE DEVICE CHECK
Battery Remaining Longevity: 49 mo
Battery Remaining Percentage: 48 %
Battery Voltage: 2.9 V
Brady Statistic RV Percent Paced: 1 %
Date Time Interrogation Session: 20230831020016
HighPow Impedance: 44 Ohm
HighPow Impedance: 44 Ohm
Implantable Lead Implant Date: 20080624
Implantable Lead Location: 753860
Implantable Lead Model: 7121
Implantable Pulse Generator Implant Date: 20170310
Lead Channel Impedance Value: 400 Ohm
Lead Channel Pacing Threshold Amplitude: 0.75 V
Lead Channel Pacing Threshold Pulse Width: 0.5 ms
Lead Channel Sensing Intrinsic Amplitude: 11.3 mV
Lead Channel Setting Pacing Amplitude: 2.5 V
Lead Channel Setting Pacing Pulse Width: 0.5 ms
Lead Channel Setting Sensing Sensitivity: 0.5 mV
Pulse Gen Serial Number: 7310877

## 2021-11-07 NOTE — Progress Notes (Signed)
Remote ICD transmission.   

## 2021-11-22 ENCOUNTER — Other Ambulatory Visit: Payer: Self-pay | Admitting: Medical

## 2022-01-16 ENCOUNTER — Ambulatory Visit (INDEPENDENT_AMBULATORY_CARE_PROVIDER_SITE_OTHER): Payer: Medicare Other

## 2022-01-16 DIAGNOSIS — I255 Ischemic cardiomyopathy: Secondary | ICD-10-CM

## 2022-01-16 LAB — CUP PACEART REMOTE DEVICE CHECK
Battery Remaining Longevity: 47 mo
Battery Remaining Percentage: 45 %
Battery Voltage: 2.89 V
Brady Statistic RV Percent Paced: 1 %
Date Time Interrogation Session: 20231130020018
HighPow Impedance: 46 Ohm
HighPow Impedance: 46 Ohm
Implantable Lead Connection Status: 753985
Implantable Lead Implant Date: 20080624
Implantable Lead Location: 753860
Implantable Lead Model: 7121
Implantable Pulse Generator Implant Date: 20170310
Lead Channel Impedance Value: 400 Ohm
Lead Channel Pacing Threshold Amplitude: 0.5 V
Lead Channel Pacing Threshold Pulse Width: 0.5 ms
Lead Channel Sensing Intrinsic Amplitude: 11.3 mV
Lead Channel Setting Pacing Amplitude: 2.5 V
Lead Channel Setting Pacing Pulse Width: 0.5 ms
Lead Channel Setting Sensing Sensitivity: 0.5 mV
Pulse Gen Serial Number: 7310877

## 2022-02-05 NOTE — Progress Notes (Signed)
This encounter was created in error - please disregard.

## 2022-02-05 NOTE — Progress Notes (Signed)
Remote ICD transmission.   

## 2022-03-24 ENCOUNTER — Other Ambulatory Visit: Payer: Self-pay | Admitting: Medical

## 2022-04-17 ENCOUNTER — Ambulatory Visit: Payer: Medicare Other

## 2022-04-17 DIAGNOSIS — I255 Ischemic cardiomyopathy: Secondary | ICD-10-CM | POA: Diagnosis not present

## 2022-04-17 LAB — CUP PACEART REMOTE DEVICE CHECK
Battery Remaining Longevity: 46 mo
Battery Remaining Percentage: 44 %
Battery Voltage: 2.89 V
Brady Statistic RV Percent Paced: 1 %
Date Time Interrogation Session: 20240229020018
HighPow Impedance: 41 Ohm
HighPow Impedance: 41 Ohm
Implantable Lead Connection Status: 753985
Implantable Lead Implant Date: 20080624
Implantable Lead Location: 753860
Implantable Lead Model: 7121
Implantable Pulse Generator Implant Date: 20170310
Lead Channel Impedance Value: 400 Ohm
Lead Channel Pacing Threshold Amplitude: 0.5 V
Lead Channel Pacing Threshold Pulse Width: 0.5 ms
Lead Channel Sensing Intrinsic Amplitude: 11.3 mV
Lead Channel Setting Pacing Amplitude: 2.5 V
Lead Channel Setting Pacing Pulse Width: 0.5 ms
Lead Channel Setting Sensing Sensitivity: 0.5 mV
Pulse Gen Serial Number: 7310877

## 2022-05-19 NOTE — Progress Notes (Signed)
Remote ICD transmission.   

## 2022-06-21 ENCOUNTER — Other Ambulatory Visit: Payer: Self-pay | Admitting: Medical

## 2022-07-02 ENCOUNTER — Other Ambulatory Visit: Payer: Self-pay | Admitting: Medical

## 2022-07-17 ENCOUNTER — Ambulatory Visit (INDEPENDENT_AMBULATORY_CARE_PROVIDER_SITE_OTHER): Payer: Medicare Other

## 2022-07-17 DIAGNOSIS — I255 Ischemic cardiomyopathy: Secondary | ICD-10-CM | POA: Diagnosis not present

## 2022-07-17 LAB — CUP PACEART REMOTE DEVICE CHECK
Battery Remaining Longevity: 43 mo
Battery Remaining Percentage: 42 %
Battery Voltage: 2.89 V
Brady Statistic RV Percent Paced: 1 %
Date Time Interrogation Session: 20240530020015
HighPow Impedance: 47 Ohm
HighPow Impedance: 47 Ohm
Implantable Lead Connection Status: 753985
Implantable Lead Implant Date: 20080624
Implantable Lead Location: 753860
Implantable Lead Model: 7121
Implantable Pulse Generator Implant Date: 20170310
Lead Channel Impedance Value: 430 Ohm
Lead Channel Pacing Threshold Amplitude: 0.5 V
Lead Channel Pacing Threshold Pulse Width: 0.5 ms
Lead Channel Sensing Intrinsic Amplitude: 11.3 mV
Lead Channel Setting Pacing Amplitude: 2.5 V
Lead Channel Setting Pacing Pulse Width: 0.5 ms
Lead Channel Setting Sensing Sensitivity: 0.5 mV
Pulse Gen Serial Number: 7310877

## 2022-07-28 ENCOUNTER — Telehealth: Payer: Self-pay | Admitting: Internal Medicine

## 2022-07-28 NOTE — Telephone Encounter (Signed)
Patient states he moved to [place], [place] 2 years ago and he has a new heart doctor there. He would like to have all future remote device checks cancelled. He says his new doctor's office already contacted [place] about this last year.

## 2022-07-28 NOTE — Telephone Encounter (Signed)
Sending to make sure all monitoring is also tranferred.

## 2022-08-05 NOTE — Progress Notes (Signed)
Remote ICD transmission.   

## 2022-09-28 ENCOUNTER — Other Ambulatory Visit: Payer: Self-pay | Admitting: Medical

## 2022-10-02 ENCOUNTER — Other Ambulatory Visit: Payer: Self-pay | Admitting: Medical

## 2022-10-16 ENCOUNTER — Ambulatory Visit: Payer: Medicare Other

## 2022-10-16 LAB — CUP PACEART REMOTE DEVICE CHECK
Battery Remaining Longevity: 41 mo
Battery Remaining Percentage: 40 %
Battery Voltage: 2.87 V
Brady Statistic RV Percent Paced: 1 %
Date Time Interrogation Session: 20240829020018
HighPow Impedance: 48 Ohm
HighPow Impedance: 48 Ohm
Implantable Lead Connection Status: 753985
Implantable Lead Implant Date: 20080624
Implantable Lead Location: 753860
Implantable Lead Model: 7121
Implantable Pulse Generator Implant Date: 20170310
Lead Channel Impedance Value: 440 Ohm
Lead Channel Pacing Threshold Amplitude: 0.5 V
Lead Channel Pacing Threshold Pulse Width: 0.5 ms
Lead Channel Sensing Intrinsic Amplitude: 11.3 mV
Lead Channel Setting Pacing Amplitude: 2.5 V
Lead Channel Setting Pacing Pulse Width: 0.5 ms
Lead Channel Setting Sensing Sensitivity: 0.5 mV
Pulse Gen Serial Number: 7310877

## 2022-10-17 ENCOUNTER — Ambulatory Visit (INDEPENDENT_AMBULATORY_CARE_PROVIDER_SITE_OTHER): Payer: Medicare Other

## 2022-10-17 DIAGNOSIS — I255 Ischemic cardiomyopathy: Secondary | ICD-10-CM

## 2022-10-21 NOTE — Progress Notes (Signed)
Remote ICD transmission.   

## 2023-01-16 ENCOUNTER — Ambulatory Visit (INDEPENDENT_AMBULATORY_CARE_PROVIDER_SITE_OTHER): Payer: Medicare Other

## 2023-01-16 DIAGNOSIS — I255 Ischemic cardiomyopathy: Secondary | ICD-10-CM

## 2023-01-17 LAB — CUP PACEART REMOTE DEVICE CHECK
Battery Remaining Longevity: 35 mo
Battery Remaining Percentage: 34 %
Battery Voltage: 2.84 V
Brady Statistic RV Percent Paced: 1 %
Date Time Interrogation Session: 20241128020016
HighPow Impedance: 49 Ohm
HighPow Impedance: 49 Ohm
Implantable Lead Connection Status: 753985
Implantable Lead Implant Date: 20080624
Implantable Lead Location: 753860
Implantable Lead Model: 7121
Implantable Pulse Generator Implant Date: 20170310
Lead Channel Impedance Value: 440 Ohm
Lead Channel Pacing Threshold Amplitude: 0.5 V
Lead Channel Pacing Threshold Pulse Width: 0.5 ms
Lead Channel Sensing Intrinsic Amplitude: 11.3 mV
Lead Channel Setting Pacing Amplitude: 2.5 V
Lead Channel Setting Pacing Pulse Width: 0.5 ms
Lead Channel Setting Sensing Sensitivity: 0.5 mV
Pulse Gen Serial Number: 7310877

## 2023-01-20 ENCOUNTER — Ambulatory Visit: Payer: Medicare Other

## 2023-02-09 NOTE — Progress Notes (Signed)
Remote ICD transmission.   

## 2023-02-09 NOTE — Addendum Note (Signed)
Addended by: Elease Etienne A on: 02/09/2023 10:45 AM   Modules accepted: Orders

## 2023-04-21 ENCOUNTER — Ambulatory Visit: Payer: Medicare Other

## 2023-07-21 ENCOUNTER — Ambulatory Visit: Payer: Medicare Other

## 2023-10-20 ENCOUNTER — Ambulatory Visit: Payer: Medicare Other

## 2024-01-19 ENCOUNTER — Ambulatory Visit: Payer: Medicare Other
